# Patient Record
Sex: Female | Born: 1945 | Race: White | Hispanic: No | Smoking: Former smoker
Health system: Southern US, Community
[De-identification: ages and names within clinical notes are randomized; demographics above are authoritative.]

## PROBLEM LIST (undated history)

## (undated) DIAGNOSIS — K5900 Constipation, unspecified: Secondary | ICD-10-CM

## (undated) DIAGNOSIS — E079 Disorder of thyroid, unspecified: Secondary | ICD-10-CM

## (undated) DIAGNOSIS — C9590 Leukemia, unspecified not having achieved remission: Secondary | ICD-10-CM

## (undated) DIAGNOSIS — F329 Major depressive disorder, single episode, unspecified: Secondary | ICD-10-CM

## (undated) DIAGNOSIS — N183 Chronic kidney disease, stage 3 (moderate): Secondary | ICD-10-CM

## (undated) DIAGNOSIS — I10 Essential (primary) hypertension: Secondary | ICD-10-CM

## (undated) DIAGNOSIS — F32A Depression, unspecified: Secondary | ICD-10-CM

## (undated) DIAGNOSIS — Z853 Personal history of malignant neoplasm of breast: Secondary | ICD-10-CM

## (undated) DIAGNOSIS — I739 Peripheral vascular disease, unspecified: Secondary | ICD-10-CM

## (undated) HISTORY — DX: Personal history of malignant neoplasm of breast: Z85.3

## (undated) HISTORY — PX: COLON SURGERY: SHX602

## (undated) HISTORY — DX: Disorder of thyroid, unspecified: E07.9

## (undated) HISTORY — PX: BREAST SURGERY: SHX581

## (undated) HISTORY — DX: Essential (primary) hypertension: I10

## (undated) HISTORY — DX: Chronic kidney disease, stage 3 (moderate): N18.3

## (undated) HISTORY — PX: ABDOMINAL HYSTERECTOMY: SHX81

## (undated) HISTORY — DX: Constipation, unspecified: K59.00

## (undated) HISTORY — DX: Major depressive disorder, single episode, unspecified: F32.9

## (undated) HISTORY — DX: Leukemia, unspecified not having achieved remission: C95.90

## (undated) HISTORY — DX: Depression, unspecified: F32.A

---

## 1998-09-03 HISTORY — PX: HERNIA REPAIR: SHX51

## 2010-12-01 ENCOUNTER — Ambulatory Visit (INDEPENDENT_AMBULATORY_CARE_PROVIDER_SITE_OTHER): Payer: Medicare Other | Admitting: Urology

## 2010-12-01 ENCOUNTER — Other Ambulatory Visit: Payer: Self-pay | Admitting: Urology

## 2010-12-01 ENCOUNTER — Ambulatory Visit (HOSPITAL_COMMUNITY)
Admission: RE | Admit: 2010-12-01 | Discharge: 2010-12-01 | Disposition: A | Discharge status: Home / Self Care | Payer: Medicare Other | Source: Ambulatory Visit | Attending: Urology | Admitting: Urology

## 2010-12-01 DIAGNOSIS — R3129 Other microscopic hematuria: Secondary | ICD-10-CM

## 2011-06-15 ENCOUNTER — Other Ambulatory Visit: Payer: Self-pay | Admitting: Urology

## 2011-06-15 ENCOUNTER — Ambulatory Visit (INDEPENDENT_AMBULATORY_CARE_PROVIDER_SITE_OTHER): Payer: Medicare Other | Admitting: Urology

## 2011-06-15 DIAGNOSIS — N393 Stress incontinence (female) (male): Secondary | ICD-10-CM

## 2011-06-15 DIAGNOSIS — R3129 Other microscopic hematuria: Secondary | ICD-10-CM

## 2011-06-19 ENCOUNTER — Ambulatory Visit (HOSPITAL_COMMUNITY)
Admission: RE | Admit: 2011-06-19 | Discharge: 2011-06-19 | Disposition: A | Discharge status: Home / Self Care | Payer: Medicare Other | Source: Ambulatory Visit | Attending: Urology | Admitting: Urology

## 2011-06-19 DIAGNOSIS — K5730 Diverticulosis of large intestine without perforation or abscess without bleeding: Secondary | ICD-10-CM | POA: Insufficient documentation

## 2011-06-19 DIAGNOSIS — Z853 Personal history of malignant neoplasm of breast: Secondary | ICD-10-CM | POA: Insufficient documentation

## 2011-06-19 DIAGNOSIS — R3129 Other microscopic hematuria: Secondary | ICD-10-CM | POA: Insufficient documentation

## 2011-06-19 MED ORDER — IOHEXOL 300 MG/ML  SOLN
125.0000 mL | Freq: Once | INTRAMUSCULAR | Status: AC | PRN
  Administered 2011-06-19: 125 mL via INTRAVENOUS

## 2011-07-13 ENCOUNTER — Ambulatory Visit (INDEPENDENT_AMBULATORY_CARE_PROVIDER_SITE_OTHER): Payer: Medicare Other | Admitting: Urology

## 2011-07-13 DIAGNOSIS — R3129 Other microscopic hematuria: Secondary | ICD-10-CM

## 2012-02-08 ENCOUNTER — Ambulatory Visit (INDEPENDENT_AMBULATORY_CARE_PROVIDER_SITE_OTHER): Payer: Medicare Other | Admitting: Urology

## 2012-02-08 DIAGNOSIS — R3129 Other microscopic hematuria: Secondary | ICD-10-CM

## 2013-01-07 DIAGNOSIS — E559 Vitamin D deficiency, unspecified: Secondary | ICD-10-CM | POA: Insufficient documentation

## 2013-01-07 DIAGNOSIS — E039 Hypothyroidism, unspecified: Secondary | ICD-10-CM | POA: Insufficient documentation

## 2013-02-23 DIAGNOSIS — G588 Other specified mononeuropathies: Secondary | ICD-10-CM | POA: Insufficient documentation

## 2013-12-28 DIAGNOSIS — G8929 Other chronic pain: Secondary | ICD-10-CM | POA: Insufficient documentation

## 2015-02-10 DIAGNOSIS — G8929 Other chronic pain: Secondary | ICD-10-CM | POA: Insufficient documentation

## 2015-02-10 DIAGNOSIS — K6289 Other specified diseases of anus and rectum: Secondary | ICD-10-CM

## 2015-05-14 DIAGNOSIS — F32A Depression, unspecified: Secondary | ICD-10-CM | POA: Insufficient documentation

## 2015-05-14 DIAGNOSIS — F329 Major depressive disorder, single episode, unspecified: Secondary | ICD-10-CM | POA: Insufficient documentation

## 2015-09-07 ENCOUNTER — Other Ambulatory Visit: Payer: Self-pay | Admitting: Podiatry

## 2015-09-07 DIAGNOSIS — I739 Peripheral vascular disease, unspecified: Secondary | ICD-10-CM

## 2015-09-20 ENCOUNTER — Ambulatory Visit
Admission: RE | Admit: 2015-09-20 | Discharge: 2015-09-20 | Disposition: A | Discharge status: Home / Self Care | Payer: Medicare Other | Source: Ambulatory Visit | Attending: Podiatry | Admitting: Podiatry

## 2015-09-20 DIAGNOSIS — I739 Peripheral vascular disease, unspecified: Secondary | ICD-10-CM

## 2015-09-20 HISTORY — DX: Peripheral vascular disease, unspecified: I73.9

## 2015-09-20 NOTE — Consult Note (Signed)
Chief Complaint: Right lower extremity wound  Referring Physician(s): Dr. Caprice Beaver  History of Present Illness: Renee [name] is a 70 y.o. female  presenting today to Vascular and Interventional radiology clinic, kindly referred by Dr. Caprice Beaver, for evaluation of a right lower extremity wound.  She tells me that she has been seeing Dr. Caprice Beaver for about a year and a half for her foot care, and during a routine appointment in December 2016, he noticed a small ulceration overlying the head of the fifth metatarsal lateral right foot. She had no knowledge that this was present as it had not caused her any discomfort.  Since then, she has been using standard wound care including Silvadene, and she has had near complete healing which is only a few weeks at this point. She tells me this is the first time she has ever had a wound on her lower extremity. She denies any claudication symptoms or resting pain. She does note that her feet "are always cold".    Her cardiovascular risk factors include high cholesterol and a history of smoking, 16 years duration, though she quit many years previously in 1981. She does not have a diagnosis of diabetes. No other evidence of vascular disease, with no history of stroke or heart disease.  She has had noninvasive examination performed at [facility], completed 08/22/2015. The results are via fax: Right resting ABI is 1.14 Right Segmental Doppler demonstrates triphasic waveform of the femoral and popliteal arteries as well as the dorsalis pedis. Biphasic waveform of the posterior tibial artery. Right pulse falling recording demonstrates maintained waveforms with augmentation maintained from high thigh to below knee. The PPG's of the toe demonstrate deterioration with near flat line, either spurious or representing small vessel disease. Left resting ABI is 1.28 Left segmental Doppler demonstrates triphasic waveform of  the femoral and popliteal arteries. There appears to be triphasic waveform of the posterior tibial and dorsalis pedis on the tracing. Segmental PVR demonstrates maintained waveform with augmentation in the calf. The toe PPG's are near flat line, which is either spurious or representing small vessel disease.  Currently she is taking Crestor. She is not taking antiplatelet medication at this time.  Past Medical History  Diagnosis Date  . PVD (peripheral vascular disease) [facility])     Past Surgical History  Procedure Laterality Date  . Hernia repair  2000    Hiatal hernia repair    . Breast surgery      Left Breast Lumpectomy  . Colon surgery    . Abdominal hysterectomy      Allergies: Review of patient's allergies indicates no known allergies.  Medications: Prior to Admission medications   Medication Sig Start Date End Date Taking? Authorizing Provider  clonazePAM (KLONOPIN) 0.5 MG tablet Take 0.5 mg by mouth at bedtime.   Yes Historical Provider, MD  cloNIDine-chlorthalidone (CLORPRES) 0.2-15 MG tablet Take 1 tablet by mouth daily.   Yes Historical Provider, MD  conjugated estrogens (PREMARIN) vaginal cream Place 1 Applicatorful vaginally 2 (two) times a week.   Yes Historical Provider, MD  DULoxetine (CYMBALTA) 60 MG capsule Take 60 mg by mouth daily.   Yes Historical Provider, MD  esomeprazole (NEXIUM) 40 MG capsule Take 40 mg by mouth daily at 12 noon.   Yes Historical Provider, MD  fluticasone (FLOVENT DISKUS) 50 MCG/BLIST diskus inhaler Inhale 2 puffs into the lungs 2 (two) times daily.   Yes Historical Provider, MD  KRILL OIL PO Take 900 mg by  mouth daily.   Yes Historical Provider, MD  levothyroxine (SYNTHROID, LEVOTHROID) 100 MCG tablet Take 100 mcg by mouth daily before breakfast.   Yes Historical Provider, MD  lurasidone (LATUDA) 40 MG TABS tablet Take 40 mg by mouth daily with breakfast.   Yes Historical Provider, MD  methadone (DOLOPHINE) 10 MG tablet Take 10 mg by mouth  every 12 (twelve) hours.   Yes Historical Provider, MD  polyethylene glycol (MIRALAX / GLYCOLAX) packet Take 17 g by mouth daily.   Yes Historical Provider, MD  rosuvastatin (CRESTOR) 20 MG tablet Take 20 mg by mouth daily.   Yes Historical Provider, MD     No family history on file.  Social History   Social History  . Marital Status: Unknown    Spouse Name: N/A  . Number of Children: N/A  . Years of Education: N/A   Social History Main Topics  . Smoking status: Former Smoker -- 0.75 packs/day    Types: Cigarettes    Start date: 09/19/1962    Quit date: 09/20/1979  . Smokeless tobacco: Never Used  . Alcohol Use: No  . Drug Use: No  . Sexual Activity: Not on file   Other Topics Concern  . Not on file   Social History Narrative  . No narrative on file      Review of Systems: A 12 point ROS discussed and pertinent positives are indicated in the HPI above.  All other systems are negative.  Review of Systems  Vital Signs: Ht 6\' 1"  (1.854 m)  Wt 200 lb (90.719 kg)  BMI 26.39 kg/m2  Physical Exam Atraumatic normocephalic. Mucous membranes moist pink. Alert and oriented to person place and time. Appropriate affect. Appropriate questions. Moving all 4 extremities equally. Gross motor and sensory intact. Excursion of the chest is symmetric on inspiration and expiration. No labored breathing. Abdomen is nontender. Genitourinary deferred. Lower extremities are without edema. Right foot demonstrates nonpalpable pulse at the posterior tibial and dorsalis pedis. Doppler signal present at both. The location of the ulcer at the fifth metatarsal head demonstrates near complete healing, with only a small callusing scab. No erythema or rubor.   Left foot demonstrates nonpalpable pulse at the posterior tibial and dorsalis pedis. Doppler signal present at both sites. No left-sided wound.  Mallampati Score:  2  Imaging: See HPI for details of noninvasive exam.    Labs:  CBC: No results for input(s): WBC, HGB, HCT, PLT in the last 8760 hours.  COAGS: No results for input(s): INR, APTT in the last 8760 hours.  BMP: No results for input(s): NA, K, CL, CO2, GLUCOSE, BUN, CALCIUM, CREATININE, GFRNONAA, GFRAA in the last 8760 hours.  Invalid input(s): CMP  LIVER FUNCTION TESTS: No results for input(s): BILITOT, AST, ALT, ALKPHOS, PROT, ALBUMIN in the last 8760 hours.  TUMOR MARKERS: No results for input(s): AFPTM, CEA, CA199, CHROMGRNA in the last 8760 hours.  Assessment and Plan:  Renee [name] is a 70 year old female presenting with a near completely healed ulcer of the right lower extremity overlying the fifth metatarsal head. Noninvasive examination performed in mid December demonstrates no significant arterial occlusive disease. The only interesting finding on this study is diminished waveforms of the toe PPG's, however, she is a person without diabetes as a risk factor for small vessel disease.  I talked to her about the anatomy, pathology/pathophysiology, and natural history of peripheral vascular disease, including the risk factors. I discussed with her treatment, including the foundation of treatment which is medical  therapy. I did share with her my impression that her noninvasive exam shows no significant vascular disease, although she may be someone with small vessel disease below the ankles.  Given that she has healed her wound in appropriate time frame, it is possible that she has no compromise of her vasculature. I talked to her, in addition, about an angiogram and potential treatment, though I think it is reasonable to defer an angiogram until she potentially has another problem. Given our discussion of the risk benefit ratio, and the fact that she has healed her wound, she agrees that deferring an angiogram is reasonable.  At this time I would encourage continued medical therapy, with the addition of a daily 81 mg of aspirin.  I talked  to her about seeing her in 3 months with a repeat noninvasive examination. Her preference would be for Dr. Caprice Beaver to follow her with a noninvasive given the distance she needs to travel to our office.  I have discussed her case with Dr. Caprice Beaver, who is in agreement.  He will plan on seeing her in 3 months with a repeat noninvasive exam. He can fax the exam at that time, and we can discuss any need for treatment.   Thank you for this interesting consult.  I greatly enjoyed meeting Renee [name] and look forward to participating in their care.  A copy of this report was sent to the requesting provider on this date.  Electronically Signed: Corrie Mckusick 09/20/2015, 5:44 PM   I spent a total of  40 Minutes   in face to face in clinical consultation, greater than 50% of which was counseling/coordinating care for right lower extremity wound, PAD, possible angiogram and angioplasty.

## 2016-11-22 ENCOUNTER — Encounter (HOSPITAL_COMMUNITY): Payer: Self-pay | Admitting: Hematology

## 2016-11-22 ENCOUNTER — Encounter (HOSPITAL_COMMUNITY): Payer: Medicare Other

## 2016-11-22 ENCOUNTER — Encounter (HOSPITAL_COMMUNITY): Payer: Medicare Other | Attending: Hematology | Admitting: Hematology

## 2016-11-22 VITALS — BP 113/50 | HR 94 | Temp 98.2°F | Resp 18 | Ht 73.0 in | Wt 204.0 lb

## 2016-11-22 DIAGNOSIS — Z853 Personal history of malignant neoplasm of breast: Secondary | ICD-10-CM

## 2016-11-22 DIAGNOSIS — C9110 Chronic lymphocytic leukemia of B-cell type not having achieved remission: Secondary | ICD-10-CM | POA: Insufficient documentation

## 2016-11-22 LAB — COMPREHENSIVE METABOLIC PANEL
ALBUMIN: 4.1 g/dL (ref 3.5–5.0)
ALK PHOS: 87 U/L (ref 38–126)
ALT: 23 U/L (ref 14–54)
ANION GAP: 9 (ref 5–15)
AST: 32 U/L (ref 15–41)
BILIRUBIN TOTAL: 0.5 mg/dL (ref 0.3–1.2)
BUN: 8 mg/dL (ref 6–20)
CALCIUM: 9.3 mg/dL (ref 8.9–10.3)
CO2: 24 mmol/L (ref 22–32)
Chloride: 104 mmol/L (ref 101–111)
Creatinine, Ser: 1.18 mg/dL — ABNORMAL HIGH (ref 0.44–1.00)
GFR, EST AFRICAN AMERICAN: 53 mL/min — AB (ref >60)
GFR, EST NON AFRICAN AMERICAN: 46 mL/min — AB (ref >60)
GLUCOSE: 120 mg/dL — AB (ref 65–99)
POTASSIUM: 4 mmol/L (ref 3.5–5.1)
Sodium: 137 mmol/L (ref 135–145)
TOTAL PROTEIN: 7.3 g/dL (ref 6.5–8.1)

## 2016-11-22 LAB — LACTATE DEHYDROGENASE: LDH: 165 U/L (ref 98–192)

## 2016-11-22 NOTE — Patient Instructions (Signed)
[place] at [facility] Discharge Instructions  RECOMMENDATIONS MADE BY THE CONSULTANT AND ANY TEST RESULTS WILL BE SENT TO YOUR REFERRING PHYSICIAN.  You were seen today by Dr. Kerry Dory will have lab work done today and we will call you with the results. Follow up in 3 months with lab work See Amy up front for appointments   Thank you for choosing [place] at [facility] to provide your oncology and hematology care.  To afford each patient quality time with our provider, please arrive at least 15 minutes before your scheduled appointment time.    If you have a lab appointment with the [facility] please come in thru the  Main Entrance and check in at the main information desk  You need to re-schedule your appointment should you arrive 10 or more minutes late.  We strive to give you quality time with our providers, and arriving late affects you and other patients whose appointments are after yours.  Also, if you no show three or more times for appointments you may be dismissed from the clinic at the providers discretion.     Again, thank you for choosing [facility].  Our hope is that these requests will decrease the amount of time that you wait before being seen by our physicians.       _____________________________________________________________  Should you have questions after your visit to [facility], please contact our office at [phone] between the hours of 8:30 a.m. and 4:30 p.m.  Voicemails left after 4:30 p.m. will not be returned until the following business day.  For prescription refill requests, have your pharmacy contact our office.       Resources For Cancer Patients and their Caregivers ? American Cancer Society: Can assist with transportation, wigs, general needs, runs Look Good Feel Better.        [phone] ? Cancer Care: Provides financial assistance, online support groups,  medication/co-pay assistance.  1-800-813-HOPE 507-844-4761) ? [place] Assists [place] cancer patients and their families through emotional , educational and financial support.  231-763-5918 ? [place] DSS Where to apply for food stamps, Medicaid and utility assistance. 206 351 2219 ? RCATS: Transportation to medical appointments. 279-028-6056 ? Social Security Administration: May apply for disability if have a Stage IV cancer. 612-007-6643 (570)264-3819 ? [employer], Disability and Transit Services: Assists with nutrition, care and transit needs. [place] Support Programs: @10RELATIVEDAYS @ > Cancer Support Group  2nd Tuesday of the month 1pm-2pm, Journey Room  > Creative Journey  3rd Tuesday of the month 1130am-1pm, Journey Room  > Look Good Feel Better  1st Wednesday of the month 10am-12 noon, Journey Room (Call [place] to register [phone])

## 2016-11-22 NOTE — Progress Notes (Signed)
[name] Kitchen    HEMATOLOGY/ONCOLOGY CONSULTATION NOTE  Date of Service: 11/22/2016  PCP : Dr Bronwen Betters MD [facility] Internal medicine , [place], [place])  CHIEF COMPLAINTS/PURPOSE OF CONSULTATION:  Lymphocytosis  HISTORY OF PRESENTING ILLNESS:   Renee [name] is a wonderful 71 y.o. female who has been referred to [place] by Dr Akinleye/Dr Bronwen Betters MD for evaluation and management of lymphocytosis.  Patient has a history of hypertension, hypothyroidism, dyslipidemia, GERD, neuropathy, left-sided early stage breast cancer status post lumpectomy and radiation in 1999 who was noted to have leukocytosis/lymphocytosis on routine labs done with her primary care physician. Patient had labs done on 09/26/2016 that showed a WBC count of 13.47 k with absolute lymphocyte count of 7.39k.  She was subsequently seen by a hematologist in [place] had repeat labs on 10/22/2016 that showed a WBC count of 14k with an absolute lymphocyte count of 8.3k. Additional workup was done including an LDH level which was within normal limits at 204. Patient had flow cytometry that showed a clonal lymphocyte population which was CD5 positive, CD23 positive with dim lambda light chain restriction representing more than 99% of the B cells and was consistent with chronic lymphocytic leukemia. CD38 was expressed approximately 26% of the clonal B cells which is of indeterminate prognostic significance.  Patient is here to transfer care for continued management of her CLL. Lab showed no associated anemia or thrombocytopenic. The patient has no clinically reported lymph nodes enlarged. No abdominal pain or distention. No fevers no chills no night sweats. No significant weight loss. Overall feels well. She is asymptomatic with respect to her CLL. Patient was not very aware of for this diagnosis meant, the prognosis, treatment criteria and recommendations and we discussed these in great detail. Daughter who is a nurse had number of  questions were also answered in detail.  MEDICAL HISTORY:  Past Medical History:  Diagnosis Date  . PVD (peripheral vascular disease) (HCC)   Hypertension Hypothyroidism Dyslipidemia Chronic pain GERD Neuropathy History of left breast cancer in 1999 status post lumpectomy with adjuvant radiation therapy and tamoxifen for 4 years History of uterine fibroids status post total abdominal hysterectomy.  SURGICAL HISTORY: Past Surgical History:  Procedure Laterality Date  . ABDOMINAL HYSTERECTOMY    . BREAST SURGERY     Left Breast Lumpectomy  . COLON SURGERY    . HERNIA REPAIR  2000   Hiatal hernia repair    Nissen fundoplication Colonic surgery for obstruction in 2010 Right shoulder surgery  SOCIAL HISTORY: Social History   Social History  . Marital status: Unknown    Spouse name: N/A  . Number of children: N/A  . Years of education: N/A   Occupational History  . Not on file.   Social History Main Topics  . Smoking status: Former Smoker    Packs/day: 0.75    Types: Cigarettes    Start date: 09/19/1962    Quit date: 09/20/1979  . Smokeless tobacco: Never Used  . Alcohol use No  . Drug use: No  . Sexual activity: Not on file   Other Topics Concern  . Not on file   Social History Narrative  . No narrative on file  Patient is widowed and currently lives alone. Daughter works as a Marine scientist at [employer]: No family history on file.  ALLERGIES:  is allergic to bupropion; clonidine derivatives; codeine; diclofenac-misoprostol; olanzapine; pregabalin; valdecoxib; and zonisamide.  MEDICATIONS:  Current Outpatient Prescriptions  Medication Sig Dispense Refill  .  clonazePAM (KLONOPIN) 0.5 MG tablet Take 0.5 mg by mouth at bedtime.    . cloNIDine-chlorthalidone (CLORPRES) 0.2-15 MG tablet Take 1 tablet by mouth daily.    [name] Kitchen conjugated estrogens (PREMARIN) vaginal cream Place 1 Applicatorful vaginally 2 (two) times a week.    . DULoxetine  (CYMBALTA) 60 MG capsule Take 60 mg by mouth daily.    [name] Kitchen esomeprazole (NEXIUM) 40 MG capsule Take 40 mg by mouth daily at 12 noon.    . fluticasone (FLOVENT DISKUS) 50 MCG/BLIST diskus inhaler Inhale 2 puffs into the lungs 2 (two) times daily.    [name] Kitchen KRILL OIL PO Take 900 mg by mouth daily.    [name] Kitchen levothyroxine (SYNTHROID, LEVOTHROID) 100 MCG tablet Take 100 mcg by mouth daily before breakfast.    . lurasidone (LATUDA) 40 MG TABS tablet Take 40 mg by mouth daily with breakfast.    . methadone (DOLOPHINE) 10 MG tablet Take 10 mg by mouth every 12 (twelve) hours.    . polyethylene glycol (MIRALAX / GLYCOLAX) packet Take 17 g by mouth daily.    . polyethylene glycol (MIRALAX / GLYCOLAX) packet Take by mouth.    . rosuvastatin (CRESTOR) 20 MG tablet Take 20 mg by mouth daily.     No current facility-administered medications for this visit.     REVIEW OF SYSTEMS:    10 Point review of Systems was done is negative except as noted above.  PHYSICAL EXAMINATION: ECOG PERFORMANCE STATUS: 1 - Symptomatic but completely ambulatory  . Vitals:   11/22/16 0916  BP: (!) 113/50  Pulse: 94  Resp: 18  Temp: 98.2 F (36.8 C)   Filed Weights   11/22/16 0916  Weight: 204 lb (92.5 kg)   .Body mass index is 26.91 kg/m.  GENERAL:alert, in no acute distress and comfortable SKIN: no acute rashes, no significant lesions EYES: conjunctiva are pink and non-injected, sclera anicteric OROPHARYNX: MMM, no exudates, no oropharyngeal erythema or ulceration NECK: supple, no JVD LYMPH:  no palpable lymphadenopathy in the cervical, axillary or inguinal regions LUNGS: clear to auscultation b/l with normal respiratory effort HEART: regular rate & rhythm ABDOMEN:  normoactive bowel sounds , non tender, not distended.No palpable hepatosplenomegaly Extremity: no pedal edema PSYCH: alert & oriented x 3 with fluent speech NEURO: no focal motor/sensory deficits  LABORATORY DATA:  I have reviewed the data as  listed   RADIOGRAPHIC STUDIES: I have personally reviewed the radiological images as listed and agreed with the findings in the report. No results found.  ASSESSMENT & PLAN:   71 year old Caucasian female with  #1 Newly diagnosed CLL. WBC count of 14k with about 8k lymphocytes. Flow cytometry showed 99% clonal population along the B lymphocytes - which were CD5 positive CD23 positive and dim lambda light chain restriction consistent with CLL. Patient has no associated anemia or thrombocytopenia. Does not have constitutional symptoms such as fevers chills night sweats or significant weight loss. No issues with frequent infections. No palpable lymphadenopathy or hepatosplenomegaly. Overall appears to be Rai stage 0 CLL Plan - lab results were discussed in detail with the patient and her daughter. -We discussed in detail the new diagnosis of CLL, natural history, prognosis, treatment criteria, treatment options. -They were given printed educational material reviewed for additional education about CLL. -Patient currently does not meet any criteria warrant starting treatment for her chronic lymphocytic leukemia. -Her flow cytometry suggested that 26% of her clonal B-cell population was CD38 positive which is an indeterminate prognostic marker at those levels. -We  shall get a CLL fish prognostic panel on labs today. -Repeat CBC with differential, reticulocyte count, CMP today. -Patient was educated on important red signs to monitor for and report.  2) history of left-sided breast cancer status post lumpectomy followed by adjuvant radiation therapy in 1999. Patient completed 4 years of adjuvant endocrine therapy with tamoxifen. Noted was discontinued due to vaginal bleeding issues. Patient is status post total abdominal hysterectomy. Patient notes she recently had a mammogram about a week ago in [place] and was told that it was normal. Plan -We we shall try to get a copy of her  mammogram results for our records. -Patient reports no other new breast symptoms.  Return to care in 3 months with repeat labs. Earlier if any new concerns or questions.  All of the patients questions were answered with apparent satisfaction. The patient knows to call the clinic with any problems, questions or concerns.  I spent 45 minutes counseling the patient face to face. The total time spent in the appointment was 60 minutes and more than 50% was on counseling and direct patient cares.    Sullivan Lone MD [place] [facility] Hematology/Oncology Physician [facility]  (Office):       [phone] (Work cell):  [phone] (Fax):           [phone]  11/22/2016 9:08 AM

## 2016-11-23 ENCOUNTER — Other Ambulatory Visit (HOSPITAL_COMMUNITY): Payer: Self-pay

## 2016-11-23 ENCOUNTER — Other Ambulatory Visit (HOSPITAL_COMMUNITY)
Admission: RE | Admit: 2016-11-23 | Discharge: 2016-11-23 | Disposition: A | Discharge status: Home / Self Care | Payer: Medicare Other | Source: Ambulatory Visit | Attending: Oncology | Admitting: Oncology

## 2016-11-23 DIAGNOSIS — C9110 Chronic lymphocytic leukemia of B-cell type not having achieved remission: Secondary | ICD-10-CM | POA: Insufficient documentation

## 2016-11-23 LAB — CBC WITH DIFFERENTIAL/PLATELET
BASOS ABS: 0 10*3/uL (ref 0.0–0.1)
Basophils Relative: 0 %
Eosinophils Absolute: 0.2 10*3/uL (ref 0.0–0.7)
Eosinophils Relative: 1 %
HEMATOCRIT: 40.2 % (ref 36.0–46.0)
HEMOGLOBIN: 12.8 g/dL (ref 12.0–15.0)
LYMPHS PCT: 67 %
Lymphs Abs: 10.9 10*3/uL — ABNORMAL HIGH (ref 0.7–4.0)
MCH: 26.2 pg (ref 26.0–34.0)
MCHC: 31.8 g/dL (ref 30.0–36.0)
MCV: 82.4 fL (ref 78.0–100.0)
MONOS PCT: 5 %
Monocytes Absolute: 0.8 10*3/uL (ref 0.1–1.0)
Neutro Abs: 4.4 10*3/uL (ref 1.7–7.7)
Neutrophils Relative %: 27 %
Platelets: 238 10*3/uL (ref 150–400)
RBC: 4.88 MIL/uL (ref 3.87–5.11)
RDW: 15.4 % (ref 11.5–15.5)
WBC: 16.3 10*3/uL — AB (ref 4.0–10.5)

## 2016-11-23 LAB — RETICULOCYTES
RBC.: 4.88 MIL/uL (ref 3.87–5.11)
RETIC COUNT ABSOLUTE: 58.6 10*3/uL (ref 19.0–186.0)
Retic Ct Pct: 1.2 % (ref 0.4–3.1)

## 2016-12-04 LAB — TISSUE HYBRIDIZATION TO NCBH

## 2016-12-04 LAB — CHROMOSOME ANALYSIS, PERIPHERAL BLOOD

## 2017-02-21 ENCOUNTER — Encounter (HOSPITAL_COMMUNITY): Payer: Self-pay

## 2017-02-21 ENCOUNTER — Encounter (HOSPITAL_COMMUNITY): Payer: Medicare Other | Attending: Oncology | Admitting: Oncology

## 2017-02-21 ENCOUNTER — Encounter (HOSPITAL_COMMUNITY): Payer: Medicare Other

## 2017-02-21 DIAGNOSIS — C9110 Chronic lymphocytic leukemia of B-cell type not having achieved remission: Secondary | ICD-10-CM

## 2017-02-21 DIAGNOSIS — Z853 Personal history of malignant neoplasm of breast: Secondary | ICD-10-CM | POA: Diagnosis not present

## 2017-02-21 DIAGNOSIS — C9190 Lymphoid leukemia, unspecified not having achieved remission: Secondary | ICD-10-CM | POA: Diagnosis not present

## 2017-02-21 LAB — CBC WITH DIFFERENTIAL/PLATELET
BASOS ABS: 0 10*3/uL (ref 0.0–0.1)
Basophils Relative: 0 %
EOS PCT: 1 %
Eosinophils Absolute: 0.2 10*3/uL (ref 0.0–0.7)
HEMATOCRIT: 39.5 % (ref 36.0–46.0)
HEMOGLOBIN: 12.4 g/dL (ref 12.0–15.0)
LYMPHS PCT: 64 %
Lymphs Abs: 10.8 10*3/uL — ABNORMAL HIGH (ref 0.7–4.0)
MCH: 25.9 pg — ABNORMAL LOW (ref 26.0–34.0)
MCHC: 31.4 g/dL (ref 30.0–36.0)
MCV: 82.5 fL (ref 78.0–100.0)
MONOS PCT: 6 %
Monocytes Absolute: 1 10*3/uL (ref 0.1–1.0)
NEUTROS ABS: 4.9 10*3/uL (ref 1.7–7.7)
Neutrophils Relative %: 29 %
Platelets: 248 10*3/uL (ref 150–400)
RBC: 4.79 MIL/uL (ref 3.87–5.11)
RDW: 15.1 % (ref 11.5–15.5)
WBC: 16.9 10*3/uL — ABNORMAL HIGH (ref 4.0–10.5)

## 2017-02-21 LAB — COMPREHENSIVE METABOLIC PANEL
ALBUMIN: 4.1 g/dL (ref 3.5–5.0)
ALK PHOS: 84 U/L (ref 38–126)
ALT: 19 U/L (ref 14–54)
AST: 27 U/L (ref 15–41)
Anion gap: 7 (ref 5–15)
BILIRUBIN TOTAL: 0.4 mg/dL (ref 0.3–1.2)
BUN: 9 mg/dL (ref 6–20)
CALCIUM: 9.2 mg/dL (ref 8.9–10.3)
CO2: 28 mmol/L (ref 22–32)
CREATININE: 1.11 mg/dL — AB (ref 0.44–1.00)
Chloride: 105 mmol/L (ref 101–111)
GFR calc Af Amer: 57 mL/min — ABNORMAL LOW (ref >60)
GFR, EST NON AFRICAN AMERICAN: 49 mL/min — AB (ref >60)
GLUCOSE: 115 mg/dL — AB (ref 65–99)
Potassium: 4.1 mmol/L (ref 3.5–5.1)
Sodium: 140 mmol/L (ref 135–145)
TOTAL PROTEIN: 7 g/dL (ref 6.5–8.1)

## 2017-02-21 NOTE — Patient Instructions (Signed)
[facility] at [facility] Discharge Instructions  RECOMMENDATIONS MADE BY THE CONSULTANT AND ANY TEST RESULTS WILL BE SENT TO YOUR REFERRING PHYSICIAN.  You saw Dr. Zhou today.  Thank you for choosing [facility] at [facility] to provide your oncology and hematology care.  To afford each patient quality time with our provider, please arrive at least 15 minutes before your scheduled appointment time.    If you have a lab appointment with the Cancer Center please come in thru the  Main Entrance and check in at the main information desk  You need to re-schedule your appointment should you arrive 10 or more minutes late.  We strive to give you quality time with our providers, and arriving late affects you and other patients whose appointments are after yours.  Also, if you no show three or more times for appointments you may be dismissed from the clinic at the providers discretion.     Again, thank you for choosing [facility].  Our hope is that these requests will decrease the amount of time that you wait before being seen by our physicians.       _____________________________________________________________  Should you have questions after your visit to [facility], please contact our office at [phone] between the hours of 8:30 a.m. and 4:30 p.m.  Voicemails left after 4:30 p.m. will not be returned until the following business day.  For prescription refill requests, have your pharmacy contact our office.       Resources For Cancer Patients and their Caregivers ? American Cancer Society: Can assist with transportation, wigs, general needs, runs Look Good Feel Better.        1-888-227-6333 ? Cancer Care: Provides financial assistance, online support groups, medication/co-pay assistance.  1-800-813-HOPE (4673) ? [facility] Assists [place] cancer patients and their families through  emotional , educational and financial support.  336-427-4357 ? [place] DSS Where to apply for food stamps, Medicaid and utility assistance. 336-342-1394 ? [employer]ATS: Transportation to medical appointments. 336-347-2287 ? Social Security Administration: May apply for disability if have a Stage IV cancer. 336-342-7796 1-800-772-1213 ? [place] Aging, Disability and Transit Services: Assists with nutrition, care and transit needs. 336-349-2343  Cancer Center Support Programs: @10RELATIVEDAYS@ > Cancer Support Group  2nd Tuesday of the month 1pm-2pm, Journey Room  > Creative Journey  3rd Tuesday of the month 1130am-1pm, Journey Room  > Look Good Feel Better  1st Wednesday of the month 10am-12 noon, Journey Room (Call [employer] to register 1-800-395-5775)    

## 2017-02-21 NOTE — Progress Notes (Signed)
[name] Kitchen    HEMATOLOGY/ONCOLOGY CONSULTATION NOTE  Date of Service: 02/21/2017  PCP : Dr Renee Betters MD [facility] Internal medicine , [place], [place])  CHIEF COMPLAINTS/PURPOSE OF CONSULTATION:  Lymphocytosis  HISTORY OF PRESENTING ILLNESS:   Renee [name] is a wonderful 71 y.o. female who has been referred to [place] by Dr Renee [name]/Dr Renee Betters MD for evaluation and management of lymphocytosis.  Patient has a history of hypertension, hypothyroidism, dyslipidemia, GERD, neuropathy, left-sided early stage breast cancer status post lumpectomy and radiation in 1999 who was noted to have leukocytosis/lymphocytosis on routine labs done with her primary care physician. Patient had labs done on 09/26/2016 that showed a WBC count of 13.47 k with absolute lymphocyte count of 7.39k.  She was subsequently seen by a hematologist in [place] had repeat labs on 10/22/2016 that showed a WBC count of 14k with an absolute lymphocyte count of 8.3k. Additional workup was done including an LDH level which was within normal limits at 204. Patient had flow cytometry that showed a clonal lymphocyte population which was CD5 positive, CD23 positive with dim lambda light chain restriction representing more than 99% of the B cells and was consistent with chronic lymphocytic leukemia. CD38 was expressed approximately 26% of the clonal B cells which is of indeterminate prognostic significance.  Patient presents today for her 3 month follow-up. She states she has been doing well and has no complaints. She denies any drenching night sweats, unexplained weight loss, fevers or chills. No recent infections. She denies noting any lymphadenopathy. Her CBC demonstrated that her WBC is 16.9 K today.  MEDICAL HISTORY:  Past Medical History:  Diagnosis Date  . Constipation   . Depression   . Hypertension   . Leukemia ([place])    CLL  . PVD (peripheral vascular disease) ([place])   . Thyroid disease    Hypertension Hypothyroidism Dyslipidemia Chronic pain GERD Neuropathy History of left breast cancer in 1999 status post lumpectomy with adjuvant radiation therapy and tamoxifen for 4 years History of uterine fibroids status post total abdominal hysterectomy.  SURGICAL HISTORY: Past Surgical History:  Procedure Laterality Date  . ABDOMINAL HYSTERECTOMY    . BREAST SURGERY     Left Breast Lumpectomy  . COLON SURGERY    . HERNIA REPAIR  2000   Hiatal hernia repair    Nissen fundoplication Colonic surgery for obstruction in 2010 Right shoulder surgery  SOCIAL HISTORY: Social History   Social History  . Marital status: Unknown    Spouse name: N/A  . Number of children: N/A  . Years of education: N/A   Occupational History  . Not on file.   Social History Main Topics  . Smoking status: Former Smoker    Packs/day: 0.75    Types: Cigarettes    Start date: 09/19/1962    Quit date: 09/20/1979  . Smokeless tobacco: Never Used  . Alcohol use No  . Drug use: No  . Sexual activity: No   Other Topics Concern  . Not on file   Social History Narrative  . No narrative on file  Patient is widowed and currently lives alone. Daughter works as a Marine scientist at [employer]: History reviewed. No pertinent family history.  ALLERGIES:  is allergic to bupropion; clonidine derivatives; codeine; diclofenac-misoprostol; olanzapine; pregabalin; valdecoxib; and zonisamide.  MEDICATIONS:  Current Outpatient Prescriptions  Medication Sig Dispense Refill  . clonazePAM (KLONOPIN) 0.5 MG tablet Take 0.5 mg by mouth at bedtime.    . cloNIDine-chlorthalidone (CLORPRES) 0.2-15  MG tablet Take 1 tablet by mouth daily.    [name] Kitchen conjugated estrogens (PREMARIN) vaginal cream Place 1 Applicatorful vaginally 2 (two) times a week.    . DULoxetine (CYMBALTA) 60 MG capsule Take 60 mg by mouth daily.    [name] Kitchen esomeprazole (NEXIUM) 40 MG capsule Take 40 mg by mouth daily at 12 noon.    .  fluticasone (FLOVENT DISKUS) 50 MCG/BLIST diskus inhaler Inhale 2 puffs into the lungs 2 (two) times daily.    [name] Kitchen KRILL OIL PO Take 900 mg by mouth daily.    [name] Kitchen levothyroxine (SYNTHROID, LEVOTHROID) 100 MCG tablet Take 100 mcg by mouth daily before breakfast.    . lurasidone (LATUDA) 40 MG TABS tablet Take 40 mg by mouth daily with breakfast.    . methadone (DOLOPHINE) 10 MG tablet Take 10 mg by mouth every 12 (twelve) hours.    . polyethylene glycol (MIRALAX / GLYCOLAX) packet Take 17 g by mouth daily.    . rosuvastatin (CRESTOR) 20 MG tablet Take 20 mg by mouth daily.     No current facility-administered medications for this visit.     REVIEW OF SYSTEMS:    10 Point review of Systems was done is negative except as noted above.  PHYSICAL EXAMINATION: ECOG PERFORMANCE STATUS: 1 - Symptomatic but completely ambulatory  . Vitals:   02/21/17 1345  BP: (!) 104/58  Pulse: 95  Resp: 18  Temp: 98.2 F (36.8 C)   Filed Weights   02/21/17 1345  Weight: 198 lb 12.8 oz (90.2 kg)   .Body mass index is 26.23 kg/m.  Physical Exam  Constitutional: She is oriented to person, place, and time. She appears well-developed and well-nourished. No distress.  HENT:  Head: Normocephalic and atraumatic.  Mouth/Throat: No oropharyngeal exudate.  Eyes: EOM are normal. Pupils are equal, round, and reactive to light. No scleral icterus.  Neck: Normal range of motion. Neck supple.  Cardiovascular: Normal rate, regular rhythm and normal heart sounds.  Exam reveals no gallop and no friction rub.   No murmur heard. Pulmonary/Chest: Effort normal and breath sounds normal. No respiratory distress. She has no wheezes. She has no rales. She exhibits no tenderness.  Abdominal: Soft. Bowel sounds are normal. She exhibits no distension. There is no tenderness.  Musculoskeletal: Normal range of motion. She exhibits no edema, tenderness or deformity.  Lymphadenopathy:    She has no cervical adenopathy.   Neurological: She is alert and oriented to person, place, and time. No cranial nerve deficit. Coordination normal.  Skin: Skin is warm and dry. No rash noted. No erythema. No pallor.  Psychiatric: She has a normal mood and affect. Her behavior is normal. Judgment and thought content normal.    LABORATORY DATA:  I have reviewed the data as listed   RADIOGRAPHIC STUDIES: I have personally reviewed the radiological images as listed and agreed with the findings in the report. No results found.  ASSESSMENT & PLAN:   71 year old Caucasian female with  1. RAI Stage 0 CLL Flow cytometry showed 99% clonal population along the B lymphocytes - which were CD5 positive CD23 positive and dim lambda light chain restriction consistent with CLL. Reviewed labs with patient today. Mild increase in leukocytosis. Patient has no associated anemia or thrombocytopenia. No indication to start treatment at this time. Continue observation of her labs.  2.  history of left-sided breast cancer status post lumpectomy followed by adjuvant radiation therapy in 1999. Patient completed 4 years of adjuvant endocrine therapy with tamoxifen. Noted  was discontinued due to vaginal bleeding issues. Patient is status post total abdominal hysterectomy. - She is cured without any evidence of recurrence. Continue yearly mammogram surveillance. Had a recent negative mammogram this year in [place], [place].  RTC in 3 months for follow up with labs.  Orders Placed This Encounter  Procedures  . CBC with Differential    Standing Status:   Future    Standing Expiration Date:   02/21/2018  . Comprehensive metabolic panel    Standing Status:   Future    Standing Expiration Date:   02/21/2018    Twana First, MD

## 2017-05-24 ENCOUNTER — Encounter (HOSPITAL_BASED_OUTPATIENT_CLINIC_OR_DEPARTMENT_OTHER): Payer: Medicare Other | Admitting: Oncology

## 2017-05-24 ENCOUNTER — Encounter (HOSPITAL_COMMUNITY): Payer: Self-pay

## 2017-05-24 ENCOUNTER — Encounter (HOSPITAL_COMMUNITY): Payer: Medicare Other | Attending: Oncology

## 2017-05-24 VITALS — BP 118/74 | HR 92 | Resp 16 | Ht 72.0 in | Wt 196.0 lb

## 2017-05-24 DIAGNOSIS — C9190 Lymphoid leukemia, unspecified not having achieved remission: Secondary | ICD-10-CM | POA: Insufficient documentation

## 2017-05-24 DIAGNOSIS — Z853 Personal history of malignant neoplasm of breast: Secondary | ICD-10-CM

## 2017-05-24 DIAGNOSIS — C9110 Chronic lymphocytic leukemia of B-cell type not having achieved remission: Secondary | ICD-10-CM

## 2017-05-24 LAB — COMPREHENSIVE METABOLIC PANEL
ALBUMIN: 4.1 g/dL (ref 3.5–5.0)
ALT: 19 U/L (ref 14–54)
AST: 28 U/L (ref 15–41)
Alkaline Phosphatase: 78 U/L (ref 38–126)
Anion gap: 8 (ref 5–15)
BUN: 12 mg/dL (ref 6–20)
CHLORIDE: 105 mmol/L (ref 101–111)
CO2: 26 mmol/L (ref 22–32)
CREATININE: 1.08 mg/dL — AB (ref 0.44–1.00)
Calcium: 9.1 mg/dL (ref 8.9–10.3)
GFR calc Af Amer: 59 mL/min — ABNORMAL LOW (ref >60)
GFR calc non Af Amer: 51 mL/min — ABNORMAL LOW (ref >60)
GLUCOSE: 120 mg/dL — AB (ref 65–99)
POTASSIUM: 4.3 mmol/L (ref 3.5–5.1)
Sodium: 139 mmol/L (ref 135–145)
Total Bilirubin: 0.5 mg/dL (ref 0.3–1.2)
Total Protein: 6.9 g/dL (ref 6.5–8.1)

## 2017-05-24 LAB — CBC WITH DIFFERENTIAL/PLATELET
BASOS ABS: 0 10*3/uL (ref 0.0–0.1)
Basophils Relative: 0 %
Eosinophils Absolute: 0.2 10*3/uL (ref 0.0–0.7)
Eosinophils Relative: 1 %
HCT: 39.4 % (ref 36.0–46.0)
Hemoglobin: 12.7 g/dL (ref 12.0–15.0)
Lymphocytes Relative: 70 %
Lymphs Abs: 12.7 10*3/uL — ABNORMAL HIGH (ref 0.7–4.0)
MCH: 26.3 pg (ref 26.0–34.0)
MCHC: 32.2 g/dL (ref 30.0–36.0)
MCV: 81.6 fL (ref 78.0–100.0)
MONO ABS: 0.7 10*3/uL (ref 0.1–1.0)
MONOS PCT: 4 %
NEUTROS PCT: 25 %
Neutro Abs: 4.6 10*3/uL (ref 1.7–7.7)
PLATELETS: 212 10*3/uL (ref 150–400)
RBC: 4.83 MIL/uL (ref 3.87–5.11)
RDW: 15.1 % (ref 11.5–15.5)
WBC: 18.2 10*3/uL — AB (ref 4.0–10.5)

## 2017-05-24 NOTE — Progress Notes (Signed)
[name] Kitchen    HEMATOLOGY/ONCOLOGY CONSULTATION NOTE  Date of Service: 05/24/2017  PCP : Dr Bronwen Betters MD [facility] Internal medicine , [place], [place])  CHIEF COMPLAINTS/PURPOSE OF CONSULTATION:  Lymphocytosis  HISTORY OF PRESENTING ILLNESS:   Renee [name] is a wonderful 71 y.o. female who has been referred to [place] by Dr Akinleye/Dr Bronwen Betters MD for evaluation and management of lymphocytosis.  Patient has a history of hypertension, hypothyroidism, dyslipidemia, GERD, neuropathy, left-sided early stage breast cancer status post lumpectomy and radiation in 1999 who was noted to have leukocytosis/lymphocytosis on routine labs done with her primary care physician. Patient had labs done on 09/26/2016 that showed a WBC count of 13.47 k with absolute lymphocyte count of 7.39k.  She was subsequently seen by a hematologist in [place] had repeat labs on 10/22/2016 that showed a WBC count of 14k with an absolute lymphocyte count of 8.3k. Additional workup was done including an LDH level which was within normal limits at 204. Patient had flow cytometry that showed a clonal lymphocyte population which was CD5 positive, CD23 positive with dim lambda light chain restriction representing more than 99% of the B cells and was consistent with chronic lymphocytic leukemia. CD38 was expressed approximately 26% of the clonal B cells which is of indeterminate prognostic significance.  Patient presents today for continued follow up of CLL. She states that she's been doing well except for her chronic issues with chronic pain syndrome due to rectal pain. She also has constant sweats from postmenopausal symptoms. She denies any worsening fatigue, explained weight loss, fevers or chills, drenching night sweats. She denies noting any lymphadenopathy, chest pain, shortness breath, abdominal pain, focal weakness.  MEDICAL HISTORY:  Past Medical History:  Diagnosis Date  . Constipation   . Depression   . Hypertension    . Leukemia ([place])    CLL  . PVD (peripheral vascular disease) ([place])   . Thyroid disease   Hypertension Hypothyroidism Dyslipidemia Chronic pain GERD Neuropathy History of left breast cancer in 1999 status post lumpectomy with adjuvant radiation therapy and tamoxifen for 4 years History of uterine fibroids status post total abdominal hysterectomy.  SURGICAL HISTORY: Past Surgical History:  Procedure Laterality Date  . ABDOMINAL HYSTERECTOMY    . BREAST SURGERY     Left Breast Lumpectomy  . COLON SURGERY    . HERNIA REPAIR  2000   Hiatal hernia repair    Nissen fundoplication Colonic surgery for obstruction in 2010 Right shoulder surgery  SOCIAL HISTORY: Social History   Social History  . Marital status: Unknown    Spouse name: N/A  . Number of children: N/A  . Years of education: N/A   Occupational History  . Not on file.   Social History Main Topics  . Smoking status: Former Smoker    Packs/day: 0.75    Types: Cigarettes    Start date: 09/19/1962    Quit date: 09/20/1979  . Smokeless tobacco: Never Used  . Alcohol use No  . Drug use: No  . Sexual activity: No   Other Topics Concern  . Not on file   Social History Narrative  . No narrative on file  Patient is widowed and currently lives alone. Daughter works as a Marine scientist at [employer]: History reviewed. No pertinent family history.  ALLERGIES:  is allergic to bupropion; clonidine derivatives; codeine; diclofenac-misoprostol; olanzapine; pregabalin; valdecoxib; and zonisamide.  MEDICATIONS:  Current Outpatient Prescriptions  Medication Sig Dispense Refill  . clonazePAM (KLONOPIN) 0.5 MG  tablet Take 0.5 mg by mouth at bedtime.    . cloNIDine-chlorthalidone (CLORPRES) 0.2-15 MG tablet Take 1 tablet by mouth daily.    [name] Kitchen conjugated estrogens (PREMARIN) vaginal cream Place 1 Applicatorful vaginally 2 (two) times a week.    . DULoxetine (CYMBALTA) 60 MG capsule Take 60 mg by mouth  daily.    [name] Kitchen esomeprazole (NEXIUM) 40 MG capsule Take 40 mg by mouth daily at 12 noon.    . fluticasone (FLOVENT DISKUS) 50 MCG/BLIST diskus inhaler Inhale 2 puffs into the lungs 2 (two) times daily.    [name] Kitchen KRILL OIL PO Take 900 mg by mouth daily.    [name] Kitchen levothyroxine (SYNTHROID, LEVOTHROID) 100 MCG tablet Take 100 mcg by mouth daily before breakfast.    . lurasidone (LATUDA) 40 MG TABS tablet Take 40 mg by mouth daily with breakfast.    . methadone (DOLOPHINE) 10 MG tablet Take 10 mg by mouth every 12 (twelve) hours.    . polyethylene glycol (MIRALAX / GLYCOLAX) packet Take 17 g by mouth daily.    . rosuvastatin (CRESTOR) 20 MG tablet Take 20 mg by mouth daily.     No current facility-administered medications for this visit.     REVIEW OF SYSTEMS:    10 Point review of Systems was done is negative except as noted above.  PHYSICAL EXAMINATION: ECOG PERFORMANCE STATUS: 1 - Symptomatic but completely ambulatory  . Vitals:   05/24/17 1055  BP: 118/74  Pulse: 92  Resp: 16  SpO2: 98%   Filed Weights   05/24/17 1055  Weight: 196 lb (88.9 kg)   .Body mass index is 26.58 kg/m.  Physical Exam  Constitutional: She is oriented to person, place, and time. She appears well-developed and well-nourished. No distress.  HENT:  Head: Normocephalic and atraumatic.  Mouth/Throat: No oropharyngeal exudate.  Eyes: Pupils are equal, round, and reactive to light. EOM are normal. No scleral icterus.  Neck: Normal range of motion. Neck supple.  Cardiovascular: Normal rate, regular rhythm and normal heart sounds.  Exam reveals no gallop and no friction rub.   No murmur heard. Pulmonary/Chest: Effort normal and breath sounds normal. No respiratory distress. She has no wheezes. She has no rales. She exhibits no tenderness.  Abdominal: Soft. Bowel sounds are normal. She exhibits no distension. There is no tenderness.  Musculoskeletal: Normal range of motion. She exhibits no edema, tenderness or deformity.   Lymphadenopathy:    She has no cervical adenopathy.  Neurological: She is alert and oriented to person, place, and time. No cranial nerve deficit. Coordination normal.  Skin: Skin is warm and dry. No rash noted. No erythema. No pallor.  Psychiatric: She has a normal mood and affect. Her behavior is normal. Judgment and thought content normal.    LABORATORY DATA:  I have reviewed the data as listed   RADIOGRAPHIC STUDIES: I have personally reviewed the radiological images as listed and agreed with the findings in the report. No results found.  ASSESSMENT & PLAN:   71 year old Caucasian female with  1. RAI Stage 0 CLL Flow cytometry showed 99% clonal population along the B lymphocytes - which were CD5 positive CD23 positive and dim lambda light chain restriction consistent with CLL. Reviewed labs with patient today. Mild increase in leukocytosis up to 18.2k. Patient has no associated anemia or thrombocytopenia. No indication to start treatment at this time such as worsening B symptoms, anemia with hemoglobin <10, platelets <100k. Continue observation of her labs.  2.  history of left-sided  breast cancer status post lumpectomy followed by adjuvant radiation therapy in 1999. Patient completed 4 years of adjuvant endocrine therapy with tamoxifen. Noted was discontinued due to vaginal bleeding issues. Patient is status post total abdominal hysterectomy. - She is cured without any evidence of recurrence. Continue yearly mammogram surveillance. Had a recent negative mammogram this year in [place], [place].  RTC in 4 months for follow up with labs.  Orders Placed This Encounter  Procedures  . CBC with Differential    Standing Status:   Future    Standing Expiration Date:   05/24/2018  . Comprehensive metabolic panel    Standing Status:   Future    Standing Expiration Date:   05/24/2018    Twana First, MD

## 2017-09-27 ENCOUNTER — Inpatient Hospital Stay (HOSPITAL_BASED_OUTPATIENT_CLINIC_OR_DEPARTMENT_OTHER): Payer: Medicare Other | Admitting: Internal Medicine

## 2017-09-27 ENCOUNTER — Encounter (HOSPITAL_COMMUNITY): Payer: Self-pay | Admitting: Internal Medicine

## 2017-09-27 ENCOUNTER — Inpatient Hospital Stay (HOSPITAL_COMMUNITY): Payer: Medicare Other | Attending: Oncology

## 2017-09-27 ENCOUNTER — Other Ambulatory Visit: Payer: Self-pay

## 2017-09-27 VITALS — BP 118/72 | HR 92 | Temp 98.4°F | Resp 18 | Ht 72.0 in | Wt 196.0 lb

## 2017-09-27 DIAGNOSIS — Z853 Personal history of malignant neoplasm of breast: Secondary | ICD-10-CM

## 2017-09-27 DIAGNOSIS — Z923 Personal history of irradiation: Secondary | ICD-10-CM | POA: Insufficient documentation

## 2017-09-27 DIAGNOSIS — C9110 Chronic lymphocytic leukemia of B-cell type not having achieved remission: Secondary | ICD-10-CM | POA: Diagnosis present

## 2017-09-27 DIAGNOSIS — G894 Chronic pain syndrome: Secondary | ICD-10-CM

## 2017-09-27 DIAGNOSIS — Z90710 Acquired absence of both cervix and uterus: Secondary | ICD-10-CM | POA: Insufficient documentation

## 2017-09-27 LAB — COMPREHENSIVE METABOLIC PANEL
ALBUMIN: 4.1 g/dL (ref 3.5–5.0)
ALK PHOS: 88 U/L (ref 38–126)
ALT: 18 U/L (ref 14–54)
AST: 28 U/L (ref 15–41)
Anion gap: 9 (ref 5–15)
BILIRUBIN TOTAL: 0.5 mg/dL (ref 0.3–1.2)
BUN: 10 mg/dL (ref 6–20)
CALCIUM: 9.1 mg/dL (ref 8.9–10.3)
CO2: 27 mmol/L (ref 22–32)
Chloride: 102 mmol/L (ref 101–111)
Creatinine, Ser: 1.07 mg/dL — ABNORMAL HIGH (ref 0.44–1.00)
GFR calc Af Amer: 60 mL/min — ABNORMAL LOW (ref >60)
GFR calc non Af Amer: 51 mL/min — ABNORMAL LOW (ref >60)
GLUCOSE: 102 mg/dL — AB (ref 65–99)
Potassium: 4.4 mmol/L (ref 3.5–5.1)
Sodium: 138 mmol/L (ref 135–145)
TOTAL PROTEIN: 7.1 g/dL (ref 6.5–8.1)

## 2017-09-27 LAB — CBC WITH DIFFERENTIAL/PLATELET
BASOS ABS: 0 10*3/uL (ref 0.0–0.1)
Basophils Relative: 0 %
Eosinophils Absolute: 0 10*3/uL (ref 0.0–0.7)
Eosinophils Relative: 0 %
HEMATOCRIT: 40.3 % (ref 36.0–46.0)
Hemoglobin: 12.1 g/dL (ref 12.0–15.0)
LYMPHS PCT: 75 %
Lymphs Abs: 21 10*3/uL — ABNORMAL HIGH (ref 0.7–4.0)
MCH: 25.5 pg — ABNORMAL LOW (ref 26.0–34.0)
MCHC: 30 g/dL (ref 30.0–36.0)
MCV: 84.8 fL (ref 78.0–100.0)
MONOS PCT: 4 %
Monocytes Absolute: 1.1 10*3/uL — ABNORMAL HIGH (ref 0.1–1.0)
NEUTROS ABS: 5.9 10*3/uL (ref 1.7–7.7)
NEUTROS PCT: 21 %
Platelets: 253 10*3/uL (ref 150–400)
RBC: 4.75 MIL/uL (ref 3.87–5.11)
RDW: 14.9 % (ref 11.5–15.5)
WBC: 28 10*3/uL — ABNORMAL HIGH (ref 4.0–10.5)

## 2017-09-27 NOTE — Progress Notes (Signed)
[facility] hematology oncology follow-up note   PCP : Dr Bronwen Betters MD [facility] Internal medicine , [place], [place])  CHIEF COMPLAINT   CLL diagnosed in February 2018 currently on observation. Personal History of left breast cancer in 1999, underwent lumpectomy followed by adjuvant radiotherapy and took tamoxifen for 4 years.:  HISTORY OF PRESENTING ILLNESS:  Patient returns for follow-up for stage 0 CLL.  She also has a history of breast cancer she has been doing well except for her chronic symptoms of chronic pain and long-standing symptom of day and nighttime sweating. She denies any weight loss no change in appetite. She denies having felt any new lumps in the breast neck or axillae.  No abdominal pain no change in bowel habits.   MEDICAL HISTORY:  Past Medical History:  Diagnosis Date  . Constipation   . Depression   . Hypertension   . Leukemia ([place])    CLL  . PVD (peripheral vascular disease) ([place])   . Thyroid disease   Hypertension Hypothyroidism Dyslipidemia Chronic pain GERD Neuropathy History of left breast cancer in 1999 status post lumpectomy with adjuvant radiation therapy and tamoxifen for 4 years History of uterine fibroids status post total abdominal hysterectomy.  SURGICAL HISTORY: Past Surgical History:  Procedure Laterality Date  . ABDOMINAL HYSTERECTOMY    . BREAST SURGERY     Left Breast Lumpectomy  . COLON SURGERY    . HERNIA REPAIR  2000   Hiatal hernia repair    Nissen fundoplication Colonic surgery for obstruction in 2010 Right shoulder surgery  SOCIAL HISTORY: Patient is widowed and currently lives alone. Daughter works as a Marine scientist at [employer]: No family history on file.  ALLERGIES:  is allergic to bupropion; clonidine derivatives; codeine; diclofenac-misoprostol; olanzapine; pregabalin; valdecoxib; and zonisamide.  MEDICATIONS:  Current Outpatient Medications  Medication Sig  Dispense Refill  . clonazePAM (KLONOPIN) 0.5 MG tablet Take 0.5 mg by mouth at bedtime.    . cloNIDine-chlorthalidone (CLORPRES) 0.2-15 MG tablet Take 1 tablet by mouth daily.    [name] Kitchen conjugated estrogens (PREMARIN) vaginal cream Place 1 Applicatorful vaginally 2 (two) times a week.    . DULoxetine (CYMBALTA) 60 MG capsule Take 60 mg by mouth daily.    [name] Kitchen esomeprazole (NEXIUM) 40 MG capsule Take 40 mg by mouth daily at 12 noon.    . fluticasone (FLOVENT DISKUS) 50 MCG/BLIST diskus inhaler Inhale 2 puffs into the lungs 2 (two) times daily.    [name] Kitchen KRILL OIL PO Take 900 mg by mouth daily.    [name] Kitchen levothyroxine (SYNTHROID, LEVOTHROID) 100 MCG tablet Take 100 mcg by mouth daily before breakfast.    . lurasidone (LATUDA) 40 MG TABS tablet Take 40 mg by mouth daily with breakfast.    . methadone (DOLOPHINE) 10 MG tablet Take 10 mg by mouth every 12 (twelve) hours.    . polyethylene glycol (MIRALAX / GLYCOLAX) packet Take 17 g by mouth daily.    . rosuvastatin (CRESTOR) 20 MG tablet Take 20 mg by mouth daily.     No current facility-administered medications for this visit.    CMP is unremarkable except for some mild serum creatinine elevation that is stable.  Today's CBC shows an increase in lymphocyte count in 6 months it has nearly doubled  PHYSICAL EXAMINATION: Exam shows her to be well-built well-nourished, vitals are stable, no palpable lymph nodes were felt in the neck supraclavicular or axillary areas.  Abdomen is soft nontender  no hepatosplenomegaly.  All 4 extremities are without edema.  She is alert awake oriented x3.  HEENT is normal. Respirations are unlabored.  Heart rhythm is regular.  ASSESSMENT & PLAN:   72 year old Caucasian female with  1. RAI Stage 0 CLL Patient had flow cytometry that showed a clonal lymphocyte population which was CD5 positive, CD23 positive with dim lambda light chain restriction representing more than 99% of the B cells and was consistent with chronic lymphocytic  leukemia. CD38 was expressed approximately 26% of the clonal B cells which is of indeterminate prognostic significance.  CBC today shows increase in white blood cell count to 28 hemoglobin is stable at 12.1 platelets are 253.  Absolute lymphocyte count increased to 21 when compared to June 2018 it appears that her lymphocyte count has nearly doubled.  I am not able to see any cytogenetics in the electronic medical record to determine prognosis.  Given the increase in the count I would like her to return back in 3 months rather than 6 months.  She was counseled to contact us for any symptoms that appeared to be worsening including nonspecific symptoms such as unexplained weight loss low-grade fevers palpable lymph nodes abdominal pain frequent infections increase in her night sweats compared to her baseline.   2.  history of left-sided breast cancer status post lumpectomy followed by adjuvant radiation therapy in 1999. Patient completed 4 years of adjuvant endocrine therapy with tamoxifen. Noted was discontinued due to vaginal bleeding issues. Patient is status post total abdominal hysterectomy. - She is cured without any evidence of recurrence. Continue yearly mammogram surveillance. Had a recent negative mammogram this year in [place], [place].  External mammography report from 11/15/2016 at [facility] has been reviewed which was a bilateral screening mammogram with 3D imaging and shows benign BI-RADS Category 2 findings annual follow-up is recommended which will be due in March 2019.  Patient preferred to have that in [place] ordered through her OB/GYN but if the O since her OB/GYN is retiring she would like to discuss this with her primary care physician also in [place] at her upcoming visit with him next week.  3.  Chronic pain syndrome stable follows with pain clinic.

## 2017-12-26 ENCOUNTER — Other Ambulatory Visit (HOSPITAL_COMMUNITY): Payer: Self-pay

## 2017-12-26 DIAGNOSIS — C9110 Chronic lymphocytic leukemia of B-cell type not having achieved remission: Secondary | ICD-10-CM

## 2017-12-27 ENCOUNTER — Inpatient Hospital Stay (HOSPITAL_COMMUNITY): Payer: Medicare Other | Admitting: Oncology

## 2017-12-27 ENCOUNTER — Encounter (HOSPITAL_COMMUNITY): Payer: Self-pay | Admitting: Oncology

## 2017-12-27 ENCOUNTER — Other Ambulatory Visit: Payer: Self-pay

## 2017-12-27 ENCOUNTER — Inpatient Hospital Stay (HOSPITAL_COMMUNITY): Payer: Medicare Other | Attending: Hematology

## 2017-12-27 VITALS — BP 97/60 | HR 85 | Temp 98.2°F | Resp 16 | Wt 197.8 lb

## 2017-12-27 DIAGNOSIS — F329 Major depressive disorder, single episode, unspecified: Secondary | ICD-10-CM

## 2017-12-27 DIAGNOSIS — N183 Chronic kidney disease, stage 3 unspecified: Secondary | ICD-10-CM

## 2017-12-27 DIAGNOSIS — G894 Chronic pain syndrome: Secondary | ICD-10-CM

## 2017-12-27 DIAGNOSIS — C9110 Chronic lymphocytic leukemia of B-cell type not having achieved remission: Secondary | ICD-10-CM | POA: Insufficient documentation

## 2017-12-27 DIAGNOSIS — Z853 Personal history of malignant neoplasm of breast: Secondary | ICD-10-CM

## 2017-12-27 DIAGNOSIS — D649 Anemia, unspecified: Secondary | ICD-10-CM | POA: Diagnosis not present

## 2017-12-27 DIAGNOSIS — K6289 Other specified diseases of anus and rectum: Secondary | ICD-10-CM

## 2017-12-27 DIAGNOSIS — F32A Depression, unspecified: Secondary | ICD-10-CM

## 2017-12-27 DIAGNOSIS — G8929 Other chronic pain: Secondary | ICD-10-CM

## 2017-12-27 HISTORY — DX: Personal history of malignant neoplasm of breast: Z85.3

## 2017-12-27 HISTORY — DX: Chronic kidney disease, stage 3 unspecified: N18.30

## 2017-12-27 LAB — CBC WITH DIFFERENTIAL/PLATELET
Basophils Absolute: 0 K/uL (ref 0.0–0.1)
Basophils Relative: 0 %
Eosinophils Absolute: 0.3 K/uL (ref 0.0–0.7)
Eosinophils Relative: 1 %
HCT: 38.6 % (ref 36.0–46.0)
Hemoglobin: 11.6 g/dL — ABNORMAL LOW (ref 12.0–15.0)
Lymphocytes Relative: 81 %
Lymphs Abs: 27 K/uL — ABNORMAL HIGH (ref 0.7–4.0)
MCH: 24.9 pg — ABNORMAL LOW (ref 26.0–34.0)
MCHC: 30.1 g/dL (ref 30.0–36.0)
MCV: 82.8 fL (ref 78.0–100.0)
Monocytes Absolute: 1.3 K/uL — ABNORMAL HIGH (ref 0.1–1.0)
Monocytes Relative: 4 %
Neutro Abs: 4.6 K/uL (ref 1.7–7.7)
Neutrophils Relative %: 14 %
Platelets: 243 K/uL (ref 150–400)
RBC: 4.66 MIL/uL (ref 3.87–5.11)
RDW: 15.3 % (ref 11.5–15.5)
WBC: 33.2 K/uL — ABNORMAL HIGH (ref 4.0–10.5)

## 2017-12-27 LAB — COMPREHENSIVE METABOLIC PANEL
ALBUMIN: 3.8 g/dL (ref 3.5–5.0)
ALT: 16 U/L (ref 14–54)
ANION GAP: 11 (ref 5–15)
AST: 25 U/L (ref 15–41)
Alkaline Phosphatase: 83 U/L (ref 38–126)
BILIRUBIN TOTAL: 0.4 mg/dL (ref 0.3–1.2)
BUN: 12 mg/dL (ref 6–20)
CO2: 25 mmol/L (ref 22–32)
Calcium: 9.2 mg/dL (ref 8.9–10.3)
Chloride: 102 mmol/L (ref 101–111)
Creatinine, Ser: 1.14 mg/dL — ABNORMAL HIGH (ref 0.44–1.00)
GFR calc non Af Amer: 48 mL/min — ABNORMAL LOW (ref >60)
GFR, EST AFRICAN AMERICAN: 55 mL/min — AB (ref >60)
GLUCOSE: 118 mg/dL — AB (ref 65–99)
POTASSIUM: 4.3 mmol/L (ref 3.5–5.1)
Sodium: 138 mmol/L (ref 135–145)
TOTAL PROTEIN: 6.6 g/dL (ref 6.5–8.1)

## 2017-12-27 NOTE — Progress Notes (Signed)
t       Patient, No Pcp Per No address on file  CLL (chronic lymphocytic leukemia) (HCC)  History of breast cancer  Chronic pain syndrome  Chronic depression  Rectal pain, chronic  Chronic renal disease, stage 3, moderately decreased glomerular filtration rate between 30-59 mL/min/1.73 square meter (HCC)   HISTORY OF PRESENT ILLNESS: RAI Stage 0 CLL, FLOW cytometry showing a clonal lymphocyte population CD5 and CD23+ with dim lambda light chain restriction representing more than 99% of the B-cell population consistent with CLL.  CD38 was expressed approximately 26% of the clonal B cells which is of indeterminate prognostic significance.  CURRENT THERAPY:  Observation.  CURRENT STATUS: Renee [name] 72 y.o. female returns for followup of CLL, Stage 0.  She continues to have her chronic complaints of fatigue and easy bruising.  She notes that her appetite has 50% in her and her level is 50%.  She does have chronic rectal pain which is being managed with long-acting pain medication, methadone.  She rates her pain is a 3 out of 10.  She denies any the symptoms including night sweats, fevers, and chills.  She has not required any recent antibiotics.  She denies any recent hospitalizations.  She denies any new lumps or bumps on her own examination.  She denies any new pain.  She denies any abdominal discomfort or early satiety.  Review of Systems  Constitutional: Positive for malaise/fatigue. Negative for chills, fever and weight loss.  HENT: Negative.   Eyes: Negative.   Respiratory: Negative.  Negative for cough.   Cardiovascular: Negative.  Negative for chest pain.  Gastrointestinal: Negative.  Negative for blood in stool, constipation, diarrhea, melena, nausea and vomiting.  Genitourinary: Negative.   Musculoskeletal: Negative.   Skin: Negative.   Neurological: Negative.  Negative for weakness.  Endo/Heme/Allergies: Bruises/bleeds easily.  Psychiatric/Behavioral: Negative.      Past Medical History:  Diagnosis Date  . Chronic renal disease, stage 3, moderately decreased glomerular filtration rate between 30-59 mL/min/1.73 square meter ([place]) 12/27/2017  . Constipation   . Depression   . History of breast cancer 12/27/2017  . Hypertension   . Leukemia ([place])    CLL  . PVD (peripheral vascular disease) ([place])   . Thyroid disease     Past Surgical History:  Procedure Laterality Date  . ABDOMINAL HYSTERECTOMY    . BREAST SURGERY     Left Breast Lumpectomy  . COLON SURGERY    . HERNIA REPAIR  2000   Hiatal hernia repair      History reviewed. No pertinent family history.  Social History   Socioeconomic History  . Marital status: Unknown    Spouse name: Not on file  . Number of children: Not on file  . Years of education: Not on file  . Highest education level: Not on file  Occupational History  . Not on file  Social Needs  . Financial resource strain: Not on file  . Food insecurity:    Worry: Not on file    Inability: Not on file  . Transportation needs:    Medical: Not on file    Non-medical: Not on file  Tobacco Use  . Smoking status: Former Smoker    Packs/day: 0.75    Types: Cigarettes    Start date: 09/19/1962    Last attempt to quit: 09/20/1979    Years since quitting: 38.2  . Smokeless tobacco: Never Used  Substance and Sexual Activity  . Alcohol use: No  Alcohol/week: 0.0 oz  . Drug use: No  . Sexual activity: Never  Lifestyle  . Physical activity:    Days per week: Not on file    Minutes per session: Not on file  . Stress: Not on file  Relationships  . Social connections:    Talks on phone: Not on file    Gets together: Not on file    Attends religious service: Not on file    Active member of club or organization: Not on file    Attends meetings of clubs or organizations: Not on file    Relationship status: Not on file  Other Topics Concern  . Not on file  Social History Narrative  . Not on file     PHYSICAL  EXAMINATION  ECOG PERFORMANCE STATUS: 1 - Symptomatic but completely ambulatory  Vitals:   12/27/17 1113  BP: 97/60  Pulse: 85  Resp: 16  Temp: 98.2 F (36.8 C)  SpO2: 98%    GENERAL:alert, healthy, no distress, well nourished, well developed, comfortable, cooperative and smiling SKIN: skin color, texture, turgor are normal, no rashes or significant lesions HEAD: Normocephalic, No masses, lesions, tenderness or abnormalities EYES: normal, EOMI, Conjunctiva are pink and non-injected EARS: External ears normal OROPHARYNX:lips, buccal mucosa, and tongue normal and mucous membranes are moist  NECK: supple, no adenopathy, trachea midline LYMPH:  no palpable lymphadenopathy BREAST:not examined LUNGS: clear to auscultation and percussion HEART: regular rate & rhythm, no murmurs, no gallops, S1 normal and S2 normal ABDOMEN:abdomen soft, obese and normal bowel sounds BACK: Back symmetric, no curvature. EXTREMITIES:less then 2 second capillary refill, no joint deformities, effusion, or inflammation, no skin discoloration, no cyanosis  NEURO: alert & oriented x 3 with fluent speech, no focal motor/sensory deficits, gait normal   LABORATORY DATA: CBC    Component Value Date/Time   WBC 33.2 (H) 12/27/2017 1022   RBC 4.66 12/27/2017 1022   HGB 11.6 (L) 12/27/2017 1022   HCT 38.6 12/27/2017 1022   PLT 243 12/27/2017 1022   MCV 82.8 12/27/2017 1022   MCH 24.9 (L) 12/27/2017 1022   MCHC 30.1 12/27/2017 1022   RDW 15.3 12/27/2017 1022   LYMPHSABS 27.0 (H) 12/27/2017 1022   MONOABS 1.3 (H) 12/27/2017 1022   EOSABS 0.3 12/27/2017 1022   BASOSABS 0.0 12/27/2017 1022      Chemistry      Component Value Date/Time   NA 138 12/27/2017 1022   K 4.3 12/27/2017 1022   CL 102 12/27/2017 1022   CO2 25 12/27/2017 1022   BUN 12 12/27/2017 1022   CREATININE 1.14 (H) 12/27/2017 1022      Component Value Date/Time   CALCIUM 9.2 12/27/2017 1022   ALKPHOS 83 12/27/2017 1022   AST 25  12/27/2017 1022   ALT 16 12/27/2017 1022   BILITOT 0.4 12/27/2017 1022       RADIOGRAPHIC STUDIES:  No results found.   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 CD38 was expressed approximately 26% of the clonal B cells which is of indeterminate prognostic significance.  Labs today: CBC diff, CMET.  I personally reviewed and went over laboratory results with the patient.  The results are noted within this dictation.  Blood gas today demonstrate a white blood cell count of 33.2 with  a lymphocytosis of 27.  Minimal anemia at 11.6 g/dL and normal platelet count of 243,000.  Metabolic panel was unimpressive except for chronic renal disease, stage III.  I am concerned of her new anemia and rising white blood cell count.  I suspect that she will need treatment in the near future.  Given her age and comorbidities, she would be a good candidate for ibrutinib therapy.  I think she would benefit from a [facility] CLL comprehensive profile and IGVH hypermutation status analysis to help guide role of therapy and prognosis.  She will consider this option.  She did not want to have it performed today.  We have the test on-site and the patient will consider having this done in the near future.  Repeat laboratory work in 4-6 months: CBC diff, CMET.  Return in 4-6 months for follow-up.    2. History of breast cancer History of left breast cancer 1999 status post lumpectomy followed by adjuvant radiation therapy.  She also took tamoxifen x4 years  3. Chronic pain syndrome On chronic methadone  4. Chronic depression Noted and stable.  Depression is persistent.  5. Rectal pain, chronic Resulting in chronic pain syndrome.  Controlled on methadone.  6. Chronic renal  disease, stage 3, moderately decreased glomerular filtration rate between 30-59 mL/min/1.73 square meter (HCC) Chronic and stable.   ORDERS PLACED FOR THIS ENCOUNTER: No orders of the defined types were placed in this encounter.   MEDICATIONS PRESCRIBED THIS ENCOUNTER: No orders of the defined types were placed in this encounter.   THERAPY PLAN:  We will continue to monitor counts.  I suspect she will need treatment in the near future given her most recent laboratory work with a minimal anemia and rising white blood cell count.  I think she would benefit from a CLL comprehensive profile/panel in addition to IgVH hyper mutation testing by [facility].  I have confirmed that we have the test on site and this can be performed if the patient decides to pursue this in the interim.  All questions were answered. The patient knows to call the clinic with any problems, questions or concerns. We can certainly see the patient much sooner if necessary.  Patient and plan discussed with Dr. Derek Jack and she is in agreement with the aforementioned.   This note is electronically signed by: Robynn Pane, PA-C 12/27/2017 12:08 PM

## 2017-12-27 NOTE — Assessment & Plan Note (Addendum)
RAI Stage 0 CLL, FLOW cytometry showing a clonal lymphocyte population CD5 and CD23+ with dim lambda light chain restriction representing more than 99% of the B-cell population consistent with CLL. CD38 was expressed approximately 26% of the clonal B cells which is of indeterminate prognostic significance.  Labs today: CBC diff, CMET.  I personally reviewed and went over laboratory results with the patient.  The results are noted within this dictation.  Blood gas today demonstrate a white blood cell count of 33.2 with a lymphocytosis of 27.  Minimal anemia at 11.6 g/dL and normal platelet count of 243,000.  Metabolic panel was unimpressive except for chronic renal disease, stage III.  I am concerned of her new anemia and rising white blood cell count.  I suspect that she will need treatment in the near future.  Given her age and comorbidities, she would be a good candidate for ibrutinib therapy.  I think she would benefit from a [facility] CLL comprehensive profile and IGVH hypermutation status analysis to help guide role of therapy and prognosis.  She will consider this option.  She did not want to have it performed today.  We have the test on-site and the patient will consider having this done in the near future.  Repeat laboratory work in 4-6 months: CBC diff, CMET.  Return in 4-6 months for follow-up.

## 2017-12-27 NOTE — Patient Instructions (Addendum)
[place] at [facility] Discharge Instructions  Today you saw Kirby Crigler PA-C.  Labs today are stable.  Some laboratory work is pending.  Laboratory work in 4-6 months.  Return in 4-6 months for follow-up.    Thank you for choosing [place] at [facility] to provide your oncology and hematology care.  To afford each patient quality time with our provider, please arrive at least 15 minutes before your scheduled appointment time.   If you have a lab appointment with the [facility] [place] please come in thru the  Main Entrance and check in at the main information desk  You need to re-schedule your appointment should you arrive 10 or more minutes late.  We strive to give you quality time with our providers, and arriving late affects you and other patients whose appointments are after yours.  Also, if you no show three or more times for appointments you may be dismissed from the clinic at the providers discretion.     Again, thank you for choosing [facility].  Our hope is that these requests will decrease the amount of time that you wait before being seen by our physicians.       _____________________________________________________________  Should you have questions after your visit to [facility], please contact our office at [phone] between the hours of 8:30 a.m. and 4:30 p.m.  Voicemails left after 4:30 p.m. will not be returned until the following business day.  For prescription refill requests, have your pharmacy contact our office.       Resources For Cancer Patients and their Caregivers ? American Cancer Society: Can assist with transportation, wigs, general needs, runs Look Good Feel Better.        5485016192 ? Cancer Care: Provides financial assistance, online support groups, medication/co-pay assistance.  1-800-813-HOPE 367-333-8779) ? [facility] Assists [place] cancer  patients and their families through emotional , educational and financial support.  (929)660-2601 ? [place] DSS Where to apply for food stamps, Medicaid and utility assistance. (660) 456-2209 ? [facility]: Transportation to medical appointments. (803)707-7843 ? Social Security Administration: May apply for disability if have a Stage IV cancer. (414) 392-2922 618 067 4928 ? [employer], Disability and Transit Services: Assists with nutrition, care and transit needs. [place] Support Programs:   > Cancer Support Group  2nd Tuesday of the month 1pm-2pm, Journey Room   > Creative Journey  3rd Tuesday of the month 1130am-1pm, Journey Room

## 2018-03-27 ENCOUNTER — Other Ambulatory Visit (HOSPITAL_COMMUNITY): Payer: Medicare Other

## 2018-03-27 ENCOUNTER — Ambulatory Visit (HOSPITAL_COMMUNITY): Payer: Medicare Other

## 2018-04-29 ENCOUNTER — Other Ambulatory Visit (HOSPITAL_COMMUNITY): Payer: Self-pay

## 2018-04-29 DIAGNOSIS — C9110 Chronic lymphocytic leukemia of B-cell type not having achieved remission: Secondary | ICD-10-CM

## 2018-04-30 ENCOUNTER — Inpatient Hospital Stay (HOSPITAL_COMMUNITY): Payer: Medicare Other

## 2018-04-30 ENCOUNTER — Encounter (HOSPITAL_COMMUNITY): Payer: Self-pay | Admitting: Hematology

## 2018-04-30 ENCOUNTER — Inpatient Hospital Stay (HOSPITAL_COMMUNITY): Payer: Medicare Other | Attending: Hematology | Admitting: Hematology

## 2018-04-30 ENCOUNTER — Other Ambulatory Visit: Payer: Self-pay

## 2018-04-30 VITALS — BP 93/70 | HR 83 | Temp 98.7°F | Resp 18 | Wt 196.0 lb

## 2018-04-30 DIAGNOSIS — I129 Hypertensive chronic kidney disease with stage 1 through stage 4 chronic kidney disease, or unspecified chronic kidney disease: Secondary | ICD-10-CM | POA: Insufficient documentation

## 2018-04-30 DIAGNOSIS — N183 Chronic kidney disease, stage 3 (moderate): Secondary | ICD-10-CM | POA: Diagnosis not present

## 2018-04-30 DIAGNOSIS — C9110 Chronic lymphocytic leukemia of B-cell type not having achieved remission: Secondary | ICD-10-CM | POA: Diagnosis present

## 2018-04-30 DIAGNOSIS — E079 Disorder of thyroid, unspecified: Secondary | ICD-10-CM | POA: Insufficient documentation

## 2018-04-30 DIAGNOSIS — Z853 Personal history of malignant neoplasm of breast: Secondary | ICD-10-CM | POA: Insufficient documentation

## 2018-04-30 LAB — CBC WITH DIFFERENTIAL/PLATELET
BASOS PCT: 0 %
Basophils Absolute: 0.1 10*3/uL (ref 0.0–0.1)
EOS ABS: 0.3 10*3/uL (ref 0.0–0.7)
EOS PCT: 1 %
HCT: 39.5 % (ref 36.0–46.0)
Hemoglobin: 12 g/dL (ref 12.0–15.0)
Lymphocytes Relative: 88 %
Lymphs Abs: 41.8 10*3/uL (ref 0.7–4.0)
MCH: 26.3 pg (ref 26.0–34.0)
MCHC: 30.4 g/dL (ref 30.0–36.0)
MCV: 86.6 fL (ref 78.0–100.0)
Monocytes Absolute: 1.3 10*3/uL (ref 0.1–1.0)
Monocytes Relative: 3 %
Neutro Abs: 3.6 10*3/uL (ref 1.7–7.7)
Neutrophils Relative %: 8 %
PLATELETS: 213 10*3/uL (ref 150–400)
RBC: 4.56 MIL/uL (ref 3.87–5.11)
RDW: 16.3 % — AB (ref 11.5–15.5)
WBC: 47 10*3/uL — ABNORMAL HIGH (ref 4.0–10.5)

## 2018-04-30 LAB — COMPREHENSIVE METABOLIC PANEL
ALBUMIN: 4 g/dL (ref 3.5–5.0)
ALT: 18 U/L (ref 0–44)
AST: 25 U/L (ref 15–41)
Alkaline Phosphatase: 74 U/L (ref 38–126)
Anion gap: 7 (ref 5–15)
BUN: 11 mg/dL (ref 8–23)
CHLORIDE: 104 mmol/L (ref 98–111)
CO2: 28 mmol/L (ref 22–32)
CREATININE: 1.2 mg/dL — AB (ref 0.44–1.00)
Calcium: 9 mg/dL (ref 8.9–10.3)
GFR calc Af Amer: 52 mL/min — ABNORMAL LOW (ref >60)
GFR, EST NON AFRICAN AMERICAN: 45 mL/min — AB (ref >60)
Glucose, Bld: 110 mg/dL — ABNORMAL HIGH (ref 70–99)
Potassium: 4.3 mmol/L (ref 3.5–5.1)
SODIUM: 139 mmol/L (ref 135–145)
Total Bilirubin: 0.6 mg/dL (ref 0.3–1.2)
Total Protein: 7.3 g/dL (ref 6.5–8.1)

## 2018-04-30 NOTE — Assessment & Plan Note (Signed)
1.  Stage I CLL: - Diagnosis by flow cytometry showing a clonal lymphocyte palpation of CD5 positive and CD23 positive with dim lambda light chain restriction. - Denies any B symptoms including fevers, night sweats or weight loss.  Denies any recurrent infections. -Physical examination today revealed one lymph node in the left neck region.  No palpable splenomegaly. - I went over the blood work with her today.  Hemoglobin is 12.  Platelet count is 213.  White count went up from 33-47 in the last 4 months.  Lymphocyte doubling time is more than 6 months. - I will continue to monitor closely.  She will come back in 4 months for follow-up.  2.  Left breast cancer: -She had a history of left breast cancer in 1999, status post lumpectomy and radiation therapy followed by 4 years of tamoxifen. -She gets yearly mammograms in [place].

## 2018-04-30 NOTE — Patient Instructions (Signed)
[facility] at [facility] Discharge Instructions Follow up in 4 months with labs    Thank you for choosing [facility] at [facility] to provide your oncology and hematology care.  To afford each patient quality time with our provider, please arrive at least 15 minutes before your scheduled appointment time.   If you have a lab appointment with the Cancer Center please come in thru the  Main Entrance and check in at the main information desk  You need to re-schedule your appointment should you arrive 10 or more minutes late.  We strive to give you quality time with our providers, and arriving late affects you and other patients whose appointments are after yours.  Also, if you no show three or more times for appointments you may be dismissed from the clinic at the providers discretion.     Again, thank you for choosing [facility].  Our hope is that these requests will decrease the amount of time that you wait before being seen by our physicians.       _____________________________________________________________  Should you have questions after your visit to [facility], please contact our office at [phone] between the hours of 8:00 a.m. and 4:30 p.m.  Voicemails left after 4:00 p.m. will not be returned until the following business day.  For prescription refill requests, have your pharmacy contact our office and allow 72 hours.    Cancer Center Support Programs:   > Cancer Support Group  2nd Tuesday of the month 1pm-2pm, Journey Room    

## 2018-04-30 NOTE — Progress Notes (Signed)
[place],  [address]   CLINIC:  Medical Oncology/Hematology  PCP:  Patient, No Pcp Per No address on file None   REASON FOR VISIT:  Follow-up for CLL  CURRENT THERAPY: observation   INTERVAL HISTORY:  Renee [name] 72 y.o. female returns for routine follow-up for CLL. Patient is here today with her daughter. She does report fatigue at times with weakness. Patient denies any fevers, night sweats, recent infections, or chills. Denies any nausea, vomiting, or diarrhea. Patient reports her appetite and energy level at 50%. She is maintaining her weight at this time.     REVIEW OF SYSTEMS:  Review of Systems  Constitutional: Positive for fatigue.  Neurological: Positive for extremity weakness and numbness (burning right foot).  All other systems reviewed and are negative.    PAST MEDICAL/SURGICAL HISTORY:  Past Medical History:  Diagnosis Date  . Chronic renal disease, stage 3, moderately decreased glomerular filtration rate between 30-59 mL/min/1.73 square meter ([place]) 12/27/2017  . Constipation   . Depression   . History of breast cancer 12/27/2017  . Hypertension   . Leukemia ([place])    CLL  . PVD (peripheral vascular disease) ([place])   . Thyroid disease    Past Surgical History:  Procedure Laterality Date  . ABDOMINAL HYSTERECTOMY    . BREAST SURGERY     Left Breast Lumpectomy  . COLON SURGERY    . HERNIA REPAIR  2000   Hiatal hernia repair       SOCIAL HISTORY:  Social History   Socioeconomic History  . Marital status: Unknown    Spouse name: Not on file  . Number of children: Not on file  . Years of education: Not on file  . Highest education level: Not on file  Occupational History  . Not on file  Social Needs  . Financial resource strain: Not on file  . Food insecurity:    Worry: Not on file    Inability: Not on file  . Transportation needs:    Medical: Not on file    Non-medical: Not on file  Tobacco Use   . Smoking status: Former Smoker    Packs/day: 0.75    Types: Cigarettes    Start date: 09/19/1962    Last attempt to quit: 09/20/1979    Years since quitting: 38.6  . Smokeless tobacco: Never Used  Substance and Sexual Activity  . Alcohol use: No    Alcohol/week: 0.0 standard drinks  . Drug use: No  . Sexual activity: Never  Lifestyle  . Physical activity:    Days per week: Not on file    Minutes per session: Not on file  . Stress: Not on file  Relationships  . Social connections:    Talks on phone: Not on file    Gets together: Not on file    Attends religious service: Not on file    Active member of club or organization: Not on file    Attends meetings of clubs or organizations: Not on file    Relationship status: Not on file  . Intimate partner violence:    Fear of current or ex partner: Not on file    Emotionally abused: Not on file    Physically abused: Not on file    Forced sexual activity: Not on file  Other Topics Concern  . Not on file  Social History Narrative  . Not on file    FAMILY HISTORY:  History reviewed.  No pertinent family history.  CURRENT MEDICATIONS:  Outpatient Encounter Medications as of 04/30/2018  Medication Sig  . Calcium Carbonate-Vitamin D (CALCIUM HIGH POTENCY/VITAMIN D) 600-200 MG-UNIT TABS Take by mouth.  . clonazePAM (KLONOPIN) 0.5 MG tablet Take 0.5 mg by mouth at bedtime.  . cloNIDine (CATAPRES) 0.1 MG tablet Take 0.1 mg by mouth every 8 (eight) hours.  . cloNIDine-chlorthalidone (CLORPRES) 0.2-15 MG tablet Take 1 tablet by mouth daily.  [name] Kitchen conjugated estrogens (PREMARIN) vaginal cream Place 1 Applicatorful vaginally 2 (two) times a week.  . DULoxetine (CYMBALTA) 60 MG capsule Take 60 mg by mouth daily.  . IBU 600 MG tablet Take 600 mg by mouth every 6 (six) hours as needed. for pain  . KRILL OIL PO Take 900 mg by mouth daily.  [name] Kitchen levothyroxine (SYNTHROID, LEVOTHROID) 100 MCG tablet Take 100 mcg by mouth daily before breakfast.  .  lurasidone (LATUDA) 40 MG TABS tablet Take 40 mg by mouth daily with breakfast.  . methadone (DOLOPHINE) 10 MG tablet Take 15 mg by mouth every 12 (twelve) hours.   . pantoprazole (PROTONIX) 40 MG tablet Take 40 mg by mouth daily.  . polyethylene glycol (MIRALAX / GLYCOLAX) packet Take 17 g by mouth daily.  . ranitidine (ZANTAC) 150 MG tablet Take 150 mg by mouth at bedtime.  . rosuvastatin (CRESTOR) 20 MG tablet Take 20 mg by mouth daily.   No facility-administered encounter medications on file as of 04/30/2018.     ALLERGIES:  Allergies  Allergen Reactions  . Bupropion Other (See Comments)    Fidgety   . Clonidine Derivatives Other (See Comments)  . Codeine Nausea Only  . Diclofenac-Misoprostol Other (See Comments)    Fidgety   . Olanzapine Other (See Comments)    Fidgety   . Pregabalin Other (See Comments) and Rash  . Valdecoxib Other (See Comments)  . Zonisamide Other (See Comments)    Fidgety      PHYSICAL EXAM:  ECOG Performance status: 1  Vitals:   04/30/18 1419  BP: 93/70  Pulse: 83  Resp: 18  Temp: 98.7 F (37.1 C)  SpO2: 97%   Filed Weights   04/30/18 1419  Weight: 196 lb (88.9 kg)    Physical Exam  Constitutional: She is oriented to person, place, and time. She appears well-developed and well-nourished.  Neck: Normal range of motion. Neck supple.  Cardiovascular: Normal rate, regular rhythm and normal heart sounds.  Pulmonary/Chest: Effort normal and breath sounds normal.  Musculoskeletal: Normal range of motion.  Neurological: She is alert and oriented to person, place, and time.  Skin: Skin is warm and dry.  Psychiatric: She has a normal mood and affect. Her behavior is normal. Judgment and thought content normal.  There is 1 subcentimeter lymph node in the left neck palpable.  No palpable splenomegaly.   LABORATORY DATA:  I have reviewed the labs as listed.  CBC    Component Value Date/Time   WBC 47.0 (H) 04/30/2018 1305   RBC 4.56  04/30/2018 1305   HGB 12.0 04/30/2018 1305   HCT 39.5 04/30/2018 1305   PLT 213 04/30/2018 1305   MCV 86.6 04/30/2018 1305   MCH 26.3 04/30/2018 1305   MCHC 30.4 04/30/2018 1305   RDW 16.3 (H) 04/30/2018 1305   LYMPHSABS 41.8 04/30/2018 1305   MONOABS 1.3 04/30/2018 1305   EOSABS 0.3 04/30/2018 1305   BASOSABS 0.1 04/30/2018 1305   CMP Latest Ref Rng & Units 04/30/2018 12/27/2017 09/27/2017  Glucose 70 - 99  mg/dL 110(H) 118(H) 102(H)  BUN 8 - 23 mg/dL 11 12 10   Creatinine 0.44 - 1.00 mg/dL 1.20(H) 1.14(H) 1.07(H)  Sodium 135 - 145 mmol/L 139 138 138  Potassium 3.5 - 5.1 mmol/L 4.3 4.3 4.4  Chloride 98 - 111 mmol/L 104 102 102  CO2 22 - 32 mmol/L 28 25 27   Calcium 8.9 - 10.3 mg/dL 9.0 9.2 9.1  Total Protein 6.5 - 8.1 g/dL 7.3 6.6 7.1  Total Bilirubin 0.3 - 1.2 mg/dL 0.6 0.4 0.5  Alkaline Phos 38 - 126 U/L 74 83 88  AST 15 - 41 U/L 25 25 28   ALT 0 - 44 U/L 18 16 18        ASSESSMENT & PLAN:   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 - Denies any B symptoms including fevers, night sweats or weight loss.  Denies any recurrent infections. -Physical examination today revealed one lymph node in the left neck region.  No palpable splenomegaly. - I went over the blood work with her today.  Hemoglobin is 12.  Platelet count is 213.  White count went up from 33-47 in the last 4 months.  Lymphocyte doubling time is more than 6 months. - I will continue to monitor closely.  She will come back in 4 months for follow-up.  2.  Left breast cancer: -She had a history of left breast cancer in 1999, status post lumpectomy and radiation therapy followed by 4 years of tamoxifen. -She gets yearly mammograms in [place].      Orders placed this encounter:  Orders Placed This Encounter  Procedures  . CBC with Differential/Platelet  .  Comprehensive metabolic panel  . Lactate dehydrogenase      Derek Jack, MD [place] [phone]

## 2018-09-01 ENCOUNTER — Inpatient Hospital Stay (HOSPITAL_COMMUNITY): Payer: Medicare Other | Attending: Hematology

## 2018-09-01 ENCOUNTER — Encounter (HOSPITAL_COMMUNITY): Payer: Self-pay | Admitting: Hematology

## 2018-09-01 ENCOUNTER — Inpatient Hospital Stay (HOSPITAL_COMMUNITY): Payer: Medicare Other | Admitting: Hematology

## 2018-09-01 VITALS — BP 109/53 | HR 86 | Temp 98.1°F | Resp 18 | Wt 193.9 lb

## 2018-09-01 DIAGNOSIS — Z923 Personal history of irradiation: Secondary | ICD-10-CM | POA: Insufficient documentation

## 2018-09-01 DIAGNOSIS — N183 Chronic kidney disease, stage 3 (moderate): Secondary | ICD-10-CM | POA: Diagnosis not present

## 2018-09-01 DIAGNOSIS — I10 Essential (primary) hypertension: Secondary | ICD-10-CM

## 2018-09-01 DIAGNOSIS — Z853 Personal history of malignant neoplasm of breast: Secondary | ICD-10-CM | POA: Insufficient documentation

## 2018-09-01 DIAGNOSIS — C9110 Chronic lymphocytic leukemia of B-cell type not having achieved remission: Secondary | ICD-10-CM | POA: Insufficient documentation

## 2018-09-01 DIAGNOSIS — Z87891 Personal history of nicotine dependence: Secondary | ICD-10-CM | POA: Diagnosis not present

## 2018-09-01 DIAGNOSIS — Z79899 Other long term (current) drug therapy: Secondary | ICD-10-CM

## 2018-09-01 LAB — CBC WITH DIFFERENTIAL/PLATELET
BASOS ABS: 0 10*3/uL (ref 0.0–0.1)
BASOS PCT: 0 %
EOS ABS: 0 10*3/uL (ref 0.0–0.5)
EOS PCT: 0 %
HCT: 41.4 % (ref 36.0–46.0)
Hemoglobin: 12 g/dL (ref 12.0–15.0)
Lymphocytes Relative: 90 %
Lymphs Abs: 49.4 10*3/uL — ABNORMAL HIGH (ref 0.7–4.0)
MCH: 25.6 pg — ABNORMAL LOW (ref 26.0–34.0)
MCHC: 29 g/dL — ABNORMAL LOW (ref 30.0–36.0)
MCV: 88.5 fL (ref 80.0–100.0)
Monocytes Absolute: 0.9 10*3/uL (ref 0.1–1.0)
Monocytes Relative: 2 %
NEUTROS PCT: 8 %
NRBC: 0 /100{WBCs}
NRBC: 0.1 % (ref 0.0–0.2)
Neutro Abs: 4.4 10*3/uL (ref 1.7–7.7)
PLATELETS: 217 10*3/uL (ref 150–400)
RBC: 4.68 MIL/uL (ref 3.87–5.11)
RDW: 15.3 % (ref 11.5–15.5)
WBC: 54.6 10*3/uL — AB (ref 4.0–10.5)

## 2018-09-01 LAB — LACTATE DEHYDROGENASE: LDH: 157 U/L (ref 98–192)

## 2018-09-01 LAB — COMPREHENSIVE METABOLIC PANEL
ALT: 18 U/L (ref 0–44)
AST: 25 U/L (ref 15–41)
Albumin: 4.1 g/dL (ref 3.5–5.0)
Alkaline Phosphatase: 67 U/L (ref 38–126)
Anion gap: 6 (ref 5–15)
BUN: 11 mg/dL (ref 8–23)
CHLORIDE: 105 mmol/L (ref 98–111)
CO2: 28 mmol/L (ref 22–32)
CREATININE: 1.21 mg/dL — AB (ref 0.44–1.00)
Calcium: 9.1 mg/dL (ref 8.9–10.3)
GFR calc Af Amer: 52 mL/min — ABNORMAL LOW (ref >60)
GFR, EST NON AFRICAN AMERICAN: 45 mL/min — AB (ref >60)
Glucose, Bld: 85 mg/dL (ref 70–99)
POTASSIUM: 4.3 mmol/L (ref 3.5–5.1)
SODIUM: 139 mmol/L (ref 135–145)
Total Bilirubin: 0.3 mg/dL (ref 0.3–1.2)
Total Protein: 6.9 g/dL (ref 6.5–8.1)

## 2018-09-01 NOTE — Progress Notes (Signed)
[name],  [id number]   CLINIC:  Medical Oncology/Hematology  PCP:  Patient, No Pcp Per No address on file None   REASON FOR VISIT: Follow-up for CLL  CURRENT THERAPY: Observation    INTERVAL HISTORY:  Renee [name] 72 y.o. female returns for routine follow-up for CLL. She is here today and doing well. She does report her activities level is low due to her chronic rectal pain. She sits on a soft pillow to easy the pain. She lays a lot at home. She is on methadone daily for her chronic pain. Denies any nausea, vomiting, or diarrhea. Had not noticed any recent bleeding such as epistaxis, hematuria or hematochezia. Denies recent chest pain on exertion, shortness of breath on minimal exertion, pre-syncopal episodes, or palpitations. Denies any numbness or tingling in hands or feet. Denies any recent fevers, infections, or recent hospitalizations. She reports she has no appetite and her energy level 25%.     REVIEW OF SYSTEMS:  Review of Systems  All other systems reviewed and are negative.    PAST MEDICAL/SURGICAL HISTORY:  Past Medical History:  Diagnosis Date  . Chronic renal disease, stage 3, moderately decreased glomerular filtration rate between 30-59 mL/min/1.73 square meter ([place]) 12/27/2017  . Constipation   . Depression   . History of breast cancer 12/27/2017  . Hypertension   . Leukemia ([place])    CLL  . PVD (peripheral vascular disease) ([place])   . Thyroid disease    Past Surgical History:  Procedure Laterality Date  . ABDOMINAL HYSTERECTOMY    . BREAST SURGERY     Left Breast Lumpectomy  . COLON SURGERY    . HERNIA REPAIR  2000   Hiatal hernia repair       SOCIAL HISTORY:  Social History   Socioeconomic History  . Marital status: Unknown    Spouse name: Not on file  . Number of children: Not on file  . Years of education: Not on file  . Highest education level: Not on file  Occupational History  . Not on file    Social Needs  . Financial resource strain: Not on file  . Food insecurity:    Worry: Not on file    Inability: Not on file  . Transportation needs:    Medical: Not on file    Non-medical: Not on file  Tobacco Use  . Smoking status: Former Smoker    Packs/day: 0.75    Types: Cigarettes    Start date: 09/19/1962    Last attempt to quit: 09/20/1979    Years since quitting: 38.9  . Smokeless tobacco: Never Used  Substance and Sexual Activity  . Alcohol use: No    Alcohol/week: 0.0 standard drinks  . Drug use: No  . Sexual activity: Never  Lifestyle  . Physical activity:    Days per week: Not on file    Minutes per session: Not on file  . Stress: Not on file  Relationships  . Social connections:    Talks on phone: Not on file    Gets together: Not on file    Attends religious service: Not on file    Active member of club or organization: Not on file    Attends meetings of clubs or organizations: Not on file    Relationship status: Not on file  . Intimate partner violence:    Fear of current or ex partner: Not on file    Emotionally abused: Not  on file    Physically abused: Not on file    Forced sexual activity: Not on file  Other Topics Concern  . Not on file  Social History Narrative  . Not on file    FAMILY HISTORY:  History reviewed. No pertinent family history.  CURRENT MEDICATIONS:  Outpatient Encounter Medications as of 09/01/2018  Medication Sig  . Calcium Carbonate-Vitamin D (CALCIUM HIGH POTENCY/VITAMIN D) 600-200 MG-UNIT TABS Take by mouth.  . clonazePAM (KLONOPIN) 0.5 MG tablet Take 0.5 mg by mouth at bedtime.  . cloNIDine (CATAPRES) 0.1 MG tablet Take 0.1 mg by mouth every 8 (eight) hours.  . cloNIDine-chlorthalidone (CLORPRES) 0.2-15 MG tablet Take 1 tablet by mouth daily.  [name] Kitchen conjugated estrogens (PREMARIN) vaginal cream Place 1 Applicatorful vaginally 2 (two) times a week.  . DULoxetine (CYMBALTA) 60 MG capsule Take 60 mg by mouth daily.  [name] Kitchen  esomeprazole (NEXIUM) 40 MG capsule Take by mouth.  . IBU 600 MG tablet Take 600 mg by mouth every 6 (six) hours as needed. for pain  . KRILL OIL PO Take 900 mg by mouth daily.  [name] Kitchen levothyroxine (SYNTHROID, LEVOTHROID) 100 MCG tablet Take 100 mcg by mouth daily before breakfast.  . lurasidone (LATUDA) 40 MG TABS tablet Take 40 mg by mouth daily with breakfast.  . methadone (DOLOPHINE) 10 MG tablet Take 15 mg by mouth every 12 (twelve) hours.   . pantoprazole (PROTONIX) 40 MG tablet Take 40 mg by mouth daily.  . polyethylene glycol (MIRALAX / GLYCOLAX) packet Take 17 g by mouth daily.  . ranitidine (ZANTAC) 150 MG tablet Take 150 mg by mouth at bedtime.  . rosuvastatin (CRESTOR) 20 MG tablet Take 20 mg by mouth daily.   No facility-administered encounter medications on file as of 09/01/2018.     ALLERGIES:  Allergies  Allergen Reactions  . Bupropion Other (See Comments)    Fidgety   . Clonidine Derivatives Other (See Comments)  . Codeine Nausea Only  . Diclofenac-Misoprostol Other (See Comments)    Fidgety   . Olanzapine Other (See Comments)    Fidgety   . Pregabalin Other (See Comments) and Rash  . Valdecoxib Other (See Comments)  . Zonisamide Other (See Comments)    Fidgety      PHYSICAL EXAM:  ECOG Performance status: 1  Vitals:   09/01/18 1135  BP: (!) 109/53  Pulse: 86  Resp: 18  Temp: 98.1 F (36.7 C)  SpO2: 98%   Filed Weights   09/01/18 1135  Weight: 193 lb 14.4 oz (88 kg)    Physical Exam Constitutional:      Appearance: Normal appearance. She is normal weight.  Musculoskeletal: Normal range of motion.  Skin:    General: Skin is warm and dry.  Neurological:     Mental Status: She is alert and oriented to person, place, and time. Mental status is at baseline.  Psychiatric:        Mood and Affect: Mood normal.        Behavior: Behavior normal.        Thought Content: Thought content normal.        Judgment: Judgment normal.   There is a 1 cm left  mid neck lymph node which is unchanged.  No other palpable adenopathy.  No palpable hepatosplenomegaly.   LABORATORY DATA:  I have reviewed the labs as listed.  CBC    Component Value Date/Time   WBC 54.6 (HH) 09/01/2018 1113   RBC 4.68 09/01/2018 1113  HGB 12.0 09/01/2018 1113   HCT 41.4 09/01/2018 1113   PLT 217 09/01/2018 1113   MCV 88.5 09/01/2018 1113   MCH 25.6 (L) 09/01/2018 1113   MCHC 29.0 (L) 09/01/2018 1113   RDW 15.3 09/01/2018 1113   LYMPHSABS 49.4 (H) 09/01/2018 1113   MONOABS 0.9 09/01/2018 1113   EOSABS 0.0 09/01/2018 1113   BASOSABS 0.0 09/01/2018 1113   CMP Latest Ref Rng & Units 09/01/2018 04/30/2018 12/27/2017  Glucose 70 - 99 mg/dL 85 110(H) 118(H)  BUN 8 - 23 mg/dL 11 11 12   Creatinine 0.44 - 1.00 mg/dL 1.21(H) 1.20(H) 1.14(H)  Sodium 135 - 145 mmol/L 139 139 138  Potassium 3.5 - 5.1 mmol/L 4.3 4.3 4.3  Chloride 98 - 111 mmol/L 105 104 102  CO2 22 - 32 mmol/L 28 28 25   Calcium 8.9 - 10.3 mg/dL 9.1 9.0 9.2  Total Protein 6.5 - 8.1 g/dL 6.9 7.3 6.6  Total Bilirubin 0.3 - 1.2 mg/dL 0.3 0.6 0.4  Alkaline Phos 38 - 126 U/L 67 74 83  AST 15 - 41 U/L 25 25 25   ALT 0 - 44 U/L 18 18 16        DIAGNOSTIC IMAGING:  I have independently reviewed the scans and discussed with the patient.   I have reviewed Francene Finders, NP's note and agree with the documentation.  I personally performed a face-to-face visit, made revisions and my assessment and plan is as follows.    ASSESSMENT & PLAN:   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 - Denies any recurrent infections.  Denies any B symptoms including fevers, night sweats or weight loss. -Physical examination today reveals 1 lymph node in the left mid neck region.  No palpable lymphadenopathy elsewhere or splenomegaly. -We discussed the results of the  CBC from today.  White count is 55 with predominant lymphocytosis.  No other cytopenias were seen.  LDH was within normal limits. -I will see her back in 6 months for follow-up.  She was told to come back sooner if she develops any B symptoms or recurrent infections.  2.  Left breast cancer: -She had a history of left breast cancer in 1999, status post lumpectomy and radiation therapy followed by 4 years of tamoxifen. -She gets yearly mammograms in [place].  3.  CKD: -She has mild CKD.  I have advised her to avoid any nephrotoxic medication.      Orders placed this encounter:  Orders Placed This Encounter  Procedures  . Lactate dehydrogenase  . CBC with Differential/Platelet  . Comprehensive metabolic panel      Derek Jack, MD [place] [phone]

## 2018-09-01 NOTE — Assessment & Plan Note (Addendum)
1.  Stage I CLL: - Diagnosis by flow cytometry showing a clonal lymphocyte palpation of CD5 positive and CD23 positive with dim lambda light chain restriction. - Denies any recurrent infections.  Denies any B symptoms including fevers, night sweats or weight loss. -Physical examination today reveals 1 lymph node in the left mid neck region.  No palpable lymphadenopathy elsewhere or splenomegaly. -We discussed the results of the CBC from today.  White count is 55 with predominant lymphocytosis.  No other cytopenias were seen.  LDH was within normal limits. -I will see her back in 6 months for follow-up.  She was told to come back sooner if she develops any B symptoms or recurrent infections.  2.  Left breast cancer: -She had a history of left breast cancer in 1999, status post lumpectomy and radiation therapy followed by 4 years of tamoxifen. -She gets yearly mammograms in [place].  3.  CKD: -She has mild CKD.  I have advised her to avoid any nephrotoxic medication.

## 2018-09-01 NOTE — Patient Instructions (Signed)
[facility] at [facility] Discharge Instructions  Follow up in 6 months with labs    Thank you for choosing [facility] at [facility] to provide your oncology and hematology care.  To afford each patient quality time with our provider, please arrive at least 15 minutes before your scheduled appointment time.   If you have a lab appointment with the Cancer Center please come in thru the  Main Entrance and check in at the main information desk  You need to re-schedule your appointment should you arrive 10 or more minutes late.  We strive to give you quality time with our providers, and arriving late affects you and other patients whose appointments are after yours.  Also, if you no show three or more times for appointments you may be dismissed from the clinic at the providers discretion.     Again, thank you for choosing [facility].  Our hope is that these requests will decrease the amount of time that you wait before being seen by our physicians.       _____________________________________________________________  Should you have questions after your visit to  Cancer Center, please contact our office at [phone] between the hours of 8:00 a.m. and 4:30 p.m.  Voicemails left after 4:00 p.m. will not be returned until the following business day.  For prescription refill requests, have your pharmacy contact our office and allow 72 hours.    Cancer Center Support Programs:   > Cancer Support Group  2nd Tuesday of the month 1pm-2pm, Journey Room    

## 2018-09-01 NOTE — Progress Notes (Unsigned)
CRITICAL VALUE ALERT  Critical Value:  WBC 54.9  Date & Time Notied:  09/01/2018 11:53am  Provider Notified: Delton Coombes  Orders Received/Actions taken: Reported to MD.

## 2019-02-02 ENCOUNTER — Ambulatory Visit (HOSPITAL_COMMUNITY): Payer: Medicare Other | Admitting: Hematology

## 2019-03-04 ENCOUNTER — Ambulatory Visit (HOSPITAL_COMMUNITY): Payer: Medicare Other | Admitting: Hematology

## 2019-03-04 ENCOUNTER — Other Ambulatory Visit (HOSPITAL_COMMUNITY): Payer: Medicare Other

## 2019-03-05 ENCOUNTER — Inpatient Hospital Stay (HOSPITAL_COMMUNITY): Payer: Medicare Other | Attending: Hematology

## 2019-03-05 ENCOUNTER — Inpatient Hospital Stay (HOSPITAL_COMMUNITY): Payer: Medicare Other | Admitting: Hematology

## 2019-03-05 ENCOUNTER — Encounter (HOSPITAL_COMMUNITY): Payer: Self-pay | Admitting: Hematology

## 2019-03-05 ENCOUNTER — Ambulatory Visit (HOSPITAL_COMMUNITY): Payer: Medicare Other | Admitting: Hematology

## 2019-03-05 ENCOUNTER — Other Ambulatory Visit: Payer: Self-pay

## 2019-03-05 VITALS — BP 93/67 | HR 107 | Temp 97.9°F | Resp 16 | Wt 196.5 lb

## 2019-03-05 DIAGNOSIS — N183 Chronic kidney disease, stage 3 (moderate): Secondary | ICD-10-CM

## 2019-03-05 DIAGNOSIS — Z853 Personal history of malignant neoplasm of breast: Secondary | ICD-10-CM

## 2019-03-05 DIAGNOSIS — C9110 Chronic lymphocytic leukemia of B-cell type not having achieved remission: Secondary | ICD-10-CM | POA: Insufficient documentation

## 2019-03-05 LAB — CBC WITH DIFFERENTIAL/PLATELET
Abs Immature Granulocytes: 0.13 10*3/uL — ABNORMAL HIGH (ref 0.00–0.07)
Basophils Absolute: 0.2 10*3/uL — ABNORMAL HIGH (ref 0.0–0.1)
Basophils Relative: 0 %
Eosinophils Absolute: 0.2 10*3/uL (ref 0.0–0.5)
Eosinophils Relative: 0 %
HCT: 40.4 % (ref 36.0–46.0)
Hemoglobin: 11.6 g/dL — ABNORMAL LOW (ref 12.0–15.0)
Immature Granulocytes: 0 %
Lymphocytes Relative: 91 %
Lymphs Abs: 65.3 10*3/uL — ABNORMAL HIGH (ref 0.7–4.0)
MCH: 25.7 pg — ABNORMAL LOW (ref 26.0–34.0)
MCHC: 28.7 g/dL — ABNORMAL LOW (ref 30.0–36.0)
MCV: 89.4 fL (ref 80.0–100.0)
Monocytes Absolute: 1.9 10*3/uL — ABNORMAL HIGH (ref 0.1–1.0)
Monocytes Relative: 3 %
Neutro Abs: 4.5 10*3/uL (ref 1.7–7.7)
Neutrophils Relative %: 6 %
Platelets: 261 10*3/uL (ref 150–400)
RBC: 4.52 MIL/uL (ref 3.87–5.11)
RDW: 15.3 % (ref 11.5–15.5)
WBC: 72.2 10*3/uL (ref 4.0–10.5)
nRBC: 0 % (ref 0.0–0.2)

## 2019-03-05 LAB — COMPREHENSIVE METABOLIC PANEL
ALT: 14 U/L (ref 0–44)
AST: 22 U/L (ref 15–41)
Albumin: 4.1 g/dL (ref 3.5–5.0)
Alkaline Phosphatase: 74 U/L (ref 38–126)
Anion gap: 8 (ref 5–15)
BUN: 11 mg/dL (ref 8–23)
CO2: 25 mmol/L (ref 22–32)
Calcium: 8.8 mg/dL — ABNORMAL LOW (ref 8.9–10.3)
Chloride: 108 mmol/L (ref 98–111)
Creatinine, Ser: 1.22 mg/dL — ABNORMAL HIGH (ref 0.44–1.00)
GFR calc Af Amer: 52 mL/min — ABNORMAL LOW (ref >60)
GFR calc non Af Amer: 45 mL/min — ABNORMAL LOW (ref >60)
Glucose, Bld: 119 mg/dL — ABNORMAL HIGH (ref 70–99)
Potassium: 4.1 mmol/L (ref 3.5–5.1)
Sodium: 141 mmol/L (ref 135–145)
Total Bilirubin: 0.6 mg/dL (ref 0.3–1.2)
Total Protein: 6.8 g/dL (ref 6.5–8.1)

## 2019-03-05 LAB — LACTATE DEHYDROGENASE: LDH: 171 U/L (ref 98–192)

## 2019-03-05 NOTE — Progress Notes (Signed)
[place], [place] [address]   CLINIC:  Medical Oncology/Hematology  PCP:  Patient, No Pcp Per No address on file None   REASON FOR VISIT:  Follow-up for CLL/SLL  CURRENT THERAPY: Clinical surveillance    INTERVAL HISTORY:  [name] 73 y.o. female presents today for follow-up.  Reports overall doing well.  Denies any significant fatigue.  Reports left supraclavicular lymphadenopathy that is been present over the last 3 months.  She denies any fevers, chills, night sweats.  No weight loss.  She remains physically active.  Appetite is stable.   REVIEW OF SYSTEMS:  Review of Systems  Constitutional: Negative.   HENT:  Negative.   Eyes: Negative.   Respiratory: Negative.   Cardiovascular: Negative.   Gastrointestinal: Negative.   Endocrine: Negative.   Genitourinary: Negative.    Musculoskeletal: Negative.   Skin: Negative.   Neurological: Negative.   Hematological: Positive for adenopathy.  Psychiatric/Behavioral: Negative.      PAST MEDICAL/SURGICAL HISTORY:  Past Medical History:  Diagnosis Date  . Chronic renal disease, stage 3, moderately decreased glomerular filtration rate between 30-59 mL/min/1.73 square meter ([place]) 12/27/2017  . Constipation   . Depression   . History of breast cancer 12/27/2017  . Hypertension   . Leukemia ([place])    CLL  . PVD (peripheral vascular disease) ([place])   . Thyroid disease    Past Surgical History:  Procedure Laterality Date  . ABDOMINAL HYSTERECTOMY    . BREAST SURGERY     Left Breast Lumpectomy  . COLON SURGERY    . HERNIA REPAIR  2000   Hiatal hernia repair       SOCIAL HISTORY:  Social History   Socioeconomic History  . Marital status: Unknown    Spouse name: Not on file  . Number of children: Not on file  . Years of education: Not on file  . Highest education level: Not on file  Occupational History  . Not on file  Social Needs  . Financial resource strain: Not on  file  . Food insecurity    Worry: Not on file    Inability: Not on file  . Transportation needs    Medical: Not on file    Non-medical: Not on file  Tobacco Use  . Smoking status: Former Smoker    Packs/day: 0.75    Types: Cigarettes    Start date: 09/19/1962    Quit date: 09/20/1979    Years since quitting: 39.4  . Smokeless tobacco: Never Used  Substance and Sexual Activity  . Alcohol use: No    Alcohol/week: 0.0 standard drinks  . Drug use: No  . Sexual activity: Never  Lifestyle  . Physical activity    Days per week: Not on file    Minutes per session: Not on file  . Stress: Not on file  Relationships  . Social Herbalist on phone: Not on file    Gets together: Not on file    Attends religious service: Not on file    Active member of club or organization: Not on file    Attends meetings of clubs or organizations: Not on file    Relationship status: Not on file  . Intimate partner violence    Fear of current or ex partner: Not on file    Emotionally abused: Not on file    Physically abused: Not on file    Forced sexual activity: Not on file  Other Topics Concern  . Not on file  Social History Narrative  . Not on file    FAMILY HISTORY:  History reviewed. No pertinent family history.  CURRENT MEDICATIONS:  Outpatient Encounter Medications as of 03/05/2019  Medication Sig  . Calcium Carbonate-Vitamin D (CALCIUM HIGH POTENCY/VITAMIN D) 600-200 MG-UNIT TABS Take by mouth.  . clonazePAM (KLONOPIN) 0.5 MG tablet Take 0.5 mg by mouth at bedtime.  . cloNIDine (CATAPRES) 0.1 MG tablet Take 0.1 mg by mouth every 8 (eight) hours.  . cloNIDine-chlorthalidone (CLORPRES) 0.2-15 MG tablet Take 1 tablet by mouth daily.  [name] Kitchen conjugated estrogens (PREMARIN) vaginal cream Place 1 Applicatorful vaginally 2 (two) times a week.  . DULoxetine (CYMBALTA) 60 MG capsule Take 60 mg by mouth daily.  [name] Kitchen esomeprazole (NEXIUM) 40 MG capsule Take by mouth.  . IBU 600 MG tablet Take 600  mg by mouth every 6 (six) hours as needed. for pain  . KRILL OIL PO Take 900 mg by mouth daily.  [name] Kitchen levothyroxine (SYNTHROID, LEVOTHROID) 100 MCG tablet Take 100 mcg by mouth daily before breakfast.  . lurasidone (LATUDA) 40 MG TABS tablet Take 40 mg by mouth daily with breakfast.  . methadone (DOLOPHINE) 10 MG tablet Take 15 mg by mouth every 12 (twelve) hours.   . pantoprazole (PROTONIX) 40 MG tablet Take 40 mg by mouth daily.  . polyethylene glycol (MIRALAX / GLYCOLAX) packet Take 17 g by mouth daily.  . ranitidine (ZANTAC) 150 MG tablet Take 150 mg by mouth at bedtime.  . rosuvastatin (CRESTOR) 20 MG tablet Take 20 mg by mouth daily.   No facility-administered encounter medications on file as of 03/05/2019.     ALLERGIES:  Allergies  Allergen Reactions  . Bupropion Other (See Comments)    Fidgety   . Clonidine Derivatives Other (See Comments)  . Codeine Nausea Only  . Diclofenac-Misoprostol Other (See Comments)    Fidgety   . Olanzapine Other (See Comments)    Fidgety   . Pregabalin Other (See Comments) and Rash  . Valdecoxib Other (See Comments)  . Zonisamide Other (See Comments)    Fidgety      PHYSICAL EXAM:  ECOG Performance status: 1  Vitals:   03/05/19 1300  BP: 93/67  Pulse: (!) 107  Resp: 16  Temp: 97.9 F (36.6 C)  SpO2: 95%   Filed Weights   03/05/19 1300  Weight: 196 lb 8 oz (89.1 kg)    Physical Exam Constitutional:      Appearance: Normal appearance. She is obese.  HENT:     Head: Normocephalic.     Nose: Nose normal.     Mouth/Throat:     Mouth: Mucous membranes are moist.     Pharynx: Oropharynx is clear.  Eyes:     Extraocular Movements: Extraocular movements intact.     Conjunctiva/sclera: Conjunctivae normal.  Neck:     Musculoskeletal: Normal range of motion.  Cardiovascular:     Rate and Rhythm: Normal rate and regular rhythm.     Pulses: Normal pulses.     Heart sounds: Normal heart sounds.  Pulmonary:     Effort: Pulmonary  effort is normal.     Breath sounds: Normal breath sounds.  Abdominal:     General: Bowel sounds are normal.     Palpations: Abdomen is soft.  Musculoskeletal: Normal range of motion.  Skin:    General: Skin is warm.  Neurological:     General: No focal deficit present.     Mental  Status: She is alert and oriented to person, place, and time.  Psychiatric:        Mood and Affect: Mood normal.        Behavior: Behavior normal.        Thought Content: Thought content normal.        Judgment: Judgment normal.      LABORATORY DATA:  I have reviewed the labs as listed.  CBC    Component Value Date/Time   WBC 72.2 (HH) 03/05/2019 1225   RBC 4.52 03/05/2019 1225   HGB 11.6 (L) 03/05/2019 1225   HCT 40.4 03/05/2019 1225   PLT 261 03/05/2019 1225   MCV 89.4 03/05/2019 1225   MCH 25.7 (L) 03/05/2019 1225   MCHC 28.7 (L) 03/05/2019 1225   RDW 15.3 03/05/2019 1225   LYMPHSABS 65.3 (H) 03/05/2019 1225   MONOABS 1.9 (H) 03/05/2019 1225   EOSABS 0.2 03/05/2019 1225   BASOSABS 0.2 (H) 03/05/2019 1225   CMP Latest Ref Rng & Units 03/05/2019 09/01/2018 04/30/2018  Glucose 70 - 99 mg/dL 119(H) 85 110(H)  BUN 8 - 23 mg/dL 11 11 11   Creatinine 0.44 - 1.00 mg/dL 1.22(H) 1.21(H) 1.20(H)  Sodium 135 - 145 mmol/L 141 139 139  Potassium 3.5 - 5.1 mmol/L 4.1 4.3 4.3  Chloride 98 - 111 mmol/L 108 105 104  CO2 22 - 32 mmol/L 25 28 28   Calcium 8.9 - 10.3 mg/dL 8.8(L) 9.1 9.0  Total Protein 6.5 - 8.1 g/dL 6.8 6.9 7.3  Total Bilirubin 0.3 - 1.2 mg/dL 0.6 0.3 0.6  Alkaline Phos 38 - 126 U/L 74 67 74  AST 15 - 41 U/L 22 25 25   ALT 0 - 44 U/L 14 18 18        ASSESSMENT & PLAN:   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 - Denies any recurrent infections.  Denies any B symptoms including fevers, night sweats or weight loss. -Physical  examination today reveals 3 lymph nodes, one in the left mid neck region, posterior cervical and supraclavicular.  No palpable lymphadenopathy elsewhere or splenomegaly. -She has no B symptoms.  Labs today reveal increasing white blood cell count at 72.2 absolute lymphocytes are 65.3.  Hemoglobin is 11.6, platelet counts are 261k.  -Have recommended CT chest abdomen pelvis to further assess lymphadenopathy and evaluate for splenomegaly. -She will return to clinic in 2 months.  -2.  Left breast cancer: -She had a history of left breast cancer in 1999, status post lumpectomy and radiation therapy followed by 4 years of tamoxifen. -She gets yearly mammograms in [place].  3.  CKD: -She has mild CKD.  I have advised her to avoid any nephrotoxic medication.      Orders placed this encounter:  Orders Placed This Encounter  Procedures  . CT Chest W Contrast  . CT Abdomen Pelvis [address] [place], [place] [phone]

## 2019-03-05 NOTE — Assessment & Plan Note (Signed)
1.  Stage I CLL: - Diagnosis by flow cytometry showing a clonal lymphocyte palpation of CD5 positive and CD23 positive with dim lambda light chain restriction. - Denies any recurrent infections.  Denies any B symptoms including fevers, night sweats or weight loss. -Physical examination today reveals 3 lymph nodes, one in the left mid neck region, posterior cervical and supraclavicular.  No palpable lymphadenopathy elsewhere or splenomegaly. -She has no B symptoms.  Labs today reveal increasing white blood cell count at 72.2 absolute lymphocytes are 65.3.  Hemoglobin is 11.6, platelet counts are 261k.  -Have recommended CT chest abdomen pelvis to further assess lymphadenopathy and evaluate for splenomegaly. -She will return to clinic in 2 months.  -2.  Left breast cancer: -She had a history of left breast cancer in 1999, status post lumpectomy and radiation therapy followed by 4 years of tamoxifen. -She gets yearly mammograms in [place].  3.  CKD: -She has mild CKD.  I have advised her to avoid any nephrotoxic medication.

## 2019-03-09 NOTE — Progress Notes (Signed)
CRITICAL VALUE ALERT  Critical Value:  WBC 72.2  Date & Time Notied:  72.2  Provider Notified: K. Milinda Hirschfeld, NP  Orders Received/Actions taken: n/a

## 2019-05-06 ENCOUNTER — Inpatient Hospital Stay (HOSPITAL_COMMUNITY): Payer: Medicare Other | Attending: Hematology

## 2019-05-06 ENCOUNTER — Other Ambulatory Visit: Payer: Self-pay

## 2019-05-06 ENCOUNTER — Ambulatory Visit (HOSPITAL_COMMUNITY)
Admission: RE | Admit: 2019-05-06 | Discharge: 2019-05-06 | Disposition: A | Discharge status: Home / Self Care | Payer: Medicare Other | Source: Ambulatory Visit | Attending: Hematology | Admitting: Hematology

## 2019-05-06 DIAGNOSIS — C9110 Chronic lymphocytic leukemia of B-cell type not having achieved remission: Secondary | ICD-10-CM | POA: Diagnosis present

## 2019-05-06 LAB — LACTATE DEHYDROGENASE: LDH: 180 U/L (ref 98–192)

## 2019-05-06 LAB — CBC WITH DIFFERENTIAL/PLATELET
Abs Immature Granulocytes: 0.14 10*3/uL — ABNORMAL HIGH (ref 0.00–0.07)
Basophils Absolute: 0.1 10*3/uL (ref 0.0–0.1)
Basophils Relative: 0 %
Eosinophils Absolute: 0.2 10*3/uL (ref 0.0–0.5)
Eosinophils Relative: 0 %
HCT: 40.8 % (ref 36.0–46.0)
Hemoglobin: 11.7 g/dL — ABNORMAL LOW (ref 12.0–15.0)
Immature Granulocytes: 0 %
Lymphocytes Relative: 92 %
Lymphs Abs: 73.7 10*3/uL — ABNORMAL HIGH (ref 0.7–4.0)
MCH: 25.5 pg — ABNORMAL LOW (ref 26.0–34.0)
MCHC: 28.7 g/dL — ABNORMAL LOW (ref 30.0–36.0)
MCV: 89.1 fL (ref 80.0–100.0)
Monocytes Absolute: 1.2 10*3/uL — ABNORMAL HIGH (ref 0.1–1.0)
Monocytes Relative: 2 %
Neutro Abs: 4.6 10*3/uL (ref 1.7–7.7)
Neutrophils Relative %: 6 %
Platelets: 259 10*3/uL (ref 150–400)
RBC: 4.58 MIL/uL (ref 3.87–5.11)
RDW: 15.5 % (ref 11.5–15.5)
WBC: 79.9 10*3/uL (ref 4.0–10.5)
nRBC: 0 % (ref 0.0–0.2)

## 2019-05-06 LAB — COMPREHENSIVE METABOLIC PANEL
ALT: 16 U/L (ref 0–44)
AST: 22 U/L (ref 15–41)
Albumin: 4.4 g/dL (ref 3.5–5.0)
Alkaline Phosphatase: 72 U/L (ref 38–126)
Anion gap: 8 (ref 5–15)
BUN: 10 mg/dL (ref 8–23)
CO2: 26 mmol/L (ref 22–32)
Calcium: 9.4 mg/dL (ref 8.9–10.3)
Chloride: 106 mmol/L (ref 98–111)
Creatinine, Ser: 1.2 mg/dL — ABNORMAL HIGH (ref 0.44–1.00)
GFR calc Af Amer: 53 mL/min — ABNORMAL LOW (ref >60)
GFR calc non Af Amer: 45 mL/min — ABNORMAL LOW (ref >60)
Glucose, Bld: 104 mg/dL — ABNORMAL HIGH (ref 70–99)
Potassium: 4.5 mmol/L (ref 3.5–5.1)
Sodium: 140 mmol/L (ref 135–145)
Total Bilirubin: 0.5 mg/dL (ref 0.3–1.2)
Total Protein: 7.5 g/dL (ref 6.5–8.1)

## 2019-05-06 MED ORDER — IOHEXOL 300 MG/ML  SOLN: 100.0000 mL | Freq: Once | INTRAMUSCULAR | Status: DC | PRN

## 2019-05-06 MED ORDER — IOHEXOL 300 MG/ML  SOLN
75.0000 mL | Freq: Once | INTRAMUSCULAR | Status: AC | PRN
  Administered 2019-05-06: 75 mL via INTRAVENOUS

## 2019-05-06 NOTE — Progress Notes (Signed)
CRITICAL VALUE ALERT  Critical Value:  WBC 79.9  Date & Time Notied:  05/06/2019 at 1413  Provider Notified: Cristela Felt, NP  Orders Received/Actions taken: n/a

## 2019-05-08 ENCOUNTER — Ambulatory Visit (HOSPITAL_COMMUNITY): Payer: Medicare Other | Admitting: Hematology

## 2019-05-13 ENCOUNTER — Encounter (HOSPITAL_COMMUNITY): Payer: Self-pay | Admitting: Hematology

## 2019-05-13 ENCOUNTER — Other Ambulatory Visit: Payer: Self-pay

## 2019-05-13 ENCOUNTER — Inpatient Hospital Stay (HOSPITAL_COMMUNITY): Payer: Medicare Other | Attending: Hematology | Admitting: Hematology

## 2019-05-13 ENCOUNTER — Inpatient Hospital Stay (HOSPITAL_COMMUNITY): Payer: Medicare Other

## 2019-05-13 VITALS — BP 111/78 | HR 99 | Temp 97.7°F | Resp 14 | Wt 192.4 lb

## 2019-05-13 DIAGNOSIS — Z853 Personal history of malignant neoplasm of breast: Secondary | ICD-10-CM | POA: Insufficient documentation

## 2019-05-13 DIAGNOSIS — C9110 Chronic lymphocytic leukemia of B-cell type not having achieved remission: Secondary | ICD-10-CM | POA: Insufficient documentation

## 2019-05-13 DIAGNOSIS — E079 Disorder of thyroid, unspecified: Secondary | ICD-10-CM | POA: Diagnosis not present

## 2019-05-13 DIAGNOSIS — I129 Hypertensive chronic kidney disease with stage 1 through stage 4 chronic kidney disease, or unspecified chronic kidney disease: Secondary | ICD-10-CM | POA: Insufficient documentation

## 2019-05-13 DIAGNOSIS — I739 Peripheral vascular disease, unspecified: Secondary | ICD-10-CM | POA: Diagnosis not present

## 2019-05-13 DIAGNOSIS — R161 Splenomegaly, not elsewhere classified: Secondary | ICD-10-CM | POA: Insufficient documentation

## 2019-05-13 DIAGNOSIS — K5909 Other constipation: Secondary | ICD-10-CM | POA: Insufficient documentation

## 2019-05-13 DIAGNOSIS — R5383 Other fatigue: Secondary | ICD-10-CM | POA: Insufficient documentation

## 2019-05-13 DIAGNOSIS — N183 Chronic kidney disease, stage 3 (moderate): Secondary | ICD-10-CM | POA: Insufficient documentation

## 2019-05-13 MED ORDER — SENNOSIDES-DOCUSATE SODIUM 8.6-50 MG PO TABS: 1.0000 | ORAL_TABLET | Freq: Two times a day (BID) | ORAL | 3 refills | Status: AC

## 2019-05-13 NOTE — Patient Instructions (Signed)
[facility] at [facility]  Discharge Instructions:  You saw Renee Nester, NP, today. _______________________________________________________________  Thank you for choosing [facility] at [facility] to provide your oncology and hematology care.  To afford each patient quality time with our providers, please arrive at least 15 minutes before your scheduled appointment.  You need to re-schedule your appointment if you arrive 10 or more minutes late.  We strive to give you quality time with our providers, and arriving late affects you and other patients whose appointments are after yours.  Also, if you no show three or more times for appointments you may be dismissed from the clinic.  Again, thank you for choosing [facility] at [facility]. Our hope is that these requests will allow you access to exceptional care and in a timely manner. _______________________________________________________________  If you have questions after your visit, please contact our office at [phone] between the hours of 8:30 a.m. and 5:00 p.m. Voicemails left after 4:30 p.m. will not be returned until the following business day. _______________________________________________________________  For prescription refill requests, have your pharmacy contact our office. _______________________________________________________________  Recommendations made by the consultant and any test results will be sent to your referring physician. _______________________________________________________________ 

## 2019-05-20 LAB — IGVH SOMATIC HYPERMUTATION

## 2019-05-22 LAB — FISH HES LEUKEMIA, 4Q12 REA

## 2019-05-26 ENCOUNTER — Other Ambulatory Visit (HOSPITAL_COMMUNITY): Payer: Self-pay | Admitting: Hematology

## 2019-05-26 MED ORDER — LACTULOSE 10 GM/15ML PO SOLN: 10.0000 g | Freq: Two times a day (BID) | ORAL | 0 refills | Status: DC | PRN

## 2019-05-26 NOTE — Assessment & Plan Note (Addendum)
1.  Stage I CLL: - Diagnosis by flow cytometry showing a clonal lymphocyte palpation of CD5 positive and CD23 positive with dim lambda light chain restriction. - Denies any recurrent infections.  Denies any B symptoms including fevers, night sweats or weight loss. -Physical examination today reveals 3 lymph nodes, one in the left mid neck region, posterior cervical and supraclavicular.   -She has no B symptoms.  Only intermittent day sweats. -Most recent labs reveal white blood cell count 79.9, absolute lymphocyte count of 73.7, hemoglobin 11.7, platelet count 259,000. -CT of chest abdomen and pelvis was performed on May 06, 2019 and reveals diffuse lymphadenopathy throughout the chest abdomen and pelvis with associated splenomegaly. - Today, have recommended to complete a fish leukemia panel as well as I GVH hyper mutation panel.  If these results indicate unfavorable prognosis, may consider starting patient on therapy with TKI. -Patient return to clinic in 1 month.   -2.  Left breast cancer: -She had a history of left breast cancer in 1999, status post lumpectomy and radiation therapy followed by 4 years of tamoxifen. -She gets yearly mammograms in [place].  3. Chronic Constipation  - Prescribed Senokot BID.   4.  CKD: -She has mild CKD.  I have advised her to avoid any nephrotoxic medication.

## 2019-05-26 NOTE — Progress Notes (Signed)
[place], [place] [address]   CLINIC:  Medical Oncology/Hematology  PCP:  Thea Alken [address] [place] [address] [phone]   REASON FOR VISIT:  Follow-up for CLL  CURRENT THERAPY: Clinical Surveillance     INTERVAL HISTORY:  Renee [name] 73 y.o. female presents today for follow up. She reports overall doing well. She continues with reports of moderate fatigue. She reports intermittent day sweats. Denies any significant night sweats. No chills or fevers. No weight loss. Cervical lymphadenopaty is still present. She denies any infections.  She continues with reports of chronic constipation. She takes over the counter stool softeners and laxatives with little relief of her symptoms.    REVIEW OF SYSTEMS:  Review of Systems  Constitutional: Positive for fatigue.  HENT:  Negative.   Eyes: Negative.   Respiratory: Negative.   Cardiovascular: Negative.   Gastrointestinal: Negative.   Endocrine: Negative.   Genitourinary: Negative.    Musculoskeletal: Positive for arthralgias.  Skin: Negative.   Neurological: Negative.   Hematological: Positive for adenopathy.  Psychiatric/Behavioral: Negative.      PAST MEDICAL/SURGICAL HISTORY:  Past Medical History:  Diagnosis Date  . Chronic renal disease, stage 3, moderately decreased glomerular filtration rate between 30-59 mL/min/1.73 square meter ([place]) 12/27/2017  . Constipation   . Depression   . History of breast cancer 12/27/2017  . Hypertension   . Leukemia ([place])    CLL  . PVD (peripheral vascular disease) ([place])   . Thyroid disease    Past Surgical History:  Procedure Laterality Date  . ABDOMINAL HYSTERECTOMY    . BREAST SURGERY     Left Breast Lumpectomy  . COLON SURGERY    . HERNIA REPAIR  2000   Hiatal hernia repair       SOCIAL HISTORY:  Social History   Socioeconomic History  . Marital status: Unknown    Spouse name: Not on file  . Number of  children: Not on file  . Years of education: Not on file  . Highest education level: Not on file  Occupational History  . Not on file  Social Needs  . Financial resource strain: Not on file  . Food insecurity    Worry: Not on file    Inability: Not on file  . Transportation needs    Medical: Not on file    Non-medical: Not on file  Tobacco Use  . Smoking status: Former Smoker    Packs/day: 0.75    Types: Cigarettes    Start date: 09/19/1962    Quit date: 09/20/1979    Years since quitting: 39.7  . Smokeless tobacco: Never Used  Substance and Sexual Activity  . Alcohol use: No    Alcohol/week: 0.0 standard drinks  . Drug use: No  . Sexual activity: Never  Lifestyle  . Physical activity    Days per week: Not on file    Minutes per session: Not on file  . Stress: Not on file  Relationships  . Social Herbalist on phone: Not on file    Gets together: Not on file    Attends religious service: Not on file    Active member of club or organization: Not on file    Attends meetings of clubs or organizations: Not on file    Relationship status: Not on file  . Intimate partner violence    Fear of current or ex partner: Not on file    Emotionally  abused: Not on file    Physically abused: Not on file    Forced sexual activity: Not on file  Other Topics Concern  . Not on file  Social History Narrative  . Not on file    FAMILY HISTORY:  History reviewed. No pertinent family history.  CURRENT MEDICATIONS:  Outpatient Encounter Medications as of 05/13/2019  Medication Sig  . methadone (DOLOPHINE) 10 MG tablet 1.5 tabs ( 15 mg ) bid and 1 tab at HS, 4 per day  . psyllium (METAMUCIL) 58.6 % packet Take 1 packet by mouth daily.  . Calcium Carbonate-Vitamin D (CALCIUM HIGH POTENCY/VITAMIN D) 600-200 MG-UNIT TABS Take by mouth.  . clonazePAM (KLONOPIN) 0.5 MG tablet Take 0.5 mg by mouth 2 (two) times daily.   . cloNIDine (CATAPRES) 0.1 MG tablet Take 0.1 mg by mouth daily.    . cloNIDine-chlorthalidone (CLORPRES) 0.2-15 MG tablet Take 1 tablet by mouth daily.  [name] Kitchen conjugated estrogens (PREMARIN) vaginal cream Place 1 Applicatorful vaginally 2 (two) times a week.  . DULoxetine (CYMBALTA) 60 MG capsule Take 60 mg by mouth daily.  [name] Kitchen esomeprazole (NEXIUM) 40 MG capsule Take by mouth.  . IBU 600 MG tablet Take 600 mg by mouth every 6 (six) hours as needed. for pain  . KRILL OIL PO Take 900 mg by mouth daily.  [name] Kitchen levothyroxine (SYNTHROID, LEVOTHROID) 100 MCG tablet Take 100 mcg by mouth daily before breakfast.  . lurasidone (LATUDA) 40 MG TABS tablet Take 40 mg by mouth daily with breakfast.  . pantoprazole (PROTONIX) 40 MG tablet Take 40 mg by mouth daily.  . polyethylene glycol (MIRALAX / GLYCOLAX) packet Take 17 g by mouth daily.  . ranitidine (ZANTAC) 150 MG tablet Take 150 mg by mouth at bedtime.  . rosuvastatin (CRESTOR) 20 MG tablet Take 20 mg by mouth daily.  [name] Kitchen senna-docusate (SENOKOT-S) 8.6-50 MG tablet Take 1 tablet by mouth 2 (two) times daily.  . [DISCONTINUED] clonazePAM (KLONOPIN) 0.5 MG tablet Take by mouth.  . [DISCONTINUED] methadone (DOLOPHINE) 10 MG tablet Take 15 mg by mouth every 12 (twelve) hours.    No facility-administered encounter medications on file as of 05/13/2019.     ALLERGIES:  Allergies  Allergen Reactions  . Bupropion Other (See Comments)    Fidgety   . Clonidine Derivatives Other (See Comments)  . Codeine Nausea Only  . Diclofenac-Misoprostol Other (See Comments)    Fidgety   . Olanzapine Other (See Comments)    Fidgety   . Pregabalin Other (See Comments) and Rash  . Valdecoxib Other (See Comments)  . Zonisamide Other (See Comments)    Fidgety      PHYSICAL EXAM:  ECOG Performance status: 1  Vitals:   05/13/19 1201  BP: 111/78  Pulse: 99  Resp: 14  Temp: 97.7 F (36.5 C)  SpO2: 98%   Filed Weights   05/13/19 1201  Weight: 192 lb 6.4 oz (87.3 kg)    Physical Exam Constitutional:      Appearance: Normal  appearance. She is obese.  HENT:     Head: Normocephalic.     Right Ear: External ear normal.     Left Ear: External ear normal.     Nose: Nose normal.     Mouth/Throat:     Mouth: Mucous membranes are moist.     Pharynx: Oropharynx is clear.  Eyes:     Conjunctiva/sclera: Conjunctivae normal.  Cardiovascular:     Rate and Rhythm: Normal rate and regular rhythm.  Pulses: Normal pulses.     Heart sounds: Normal heart sounds.  Pulmonary:     Effort: Pulmonary effort is normal.     Breath sounds: Normal breath sounds.  Abdominal:     General: Bowel sounds are normal.  Musculoskeletal: Normal range of motion.  Lymphadenopathy:     Cervical: Cervical adenopathy present.  Skin:    General: Skin is warm.  Neurological:     General: No focal deficit present.     Mental Status: She is alert and oriented to person, place, and time.  Psychiatric:        Mood and Affect: Mood normal.        Behavior: Behavior normal.        Thought Content: Thought content normal.        Judgment: Judgment normal.      LABORATORY DATA:  I have reviewed the labs as listed.  CBC    Component Value Date/Time   WBC 79.9 (HH) 05/06/2019 1338   RBC 4.58 05/06/2019 1338   HGB 11.7 (L) 05/06/2019 1338   HCT 40.8 05/06/2019 1338   PLT 259 05/06/2019 1338   MCV 89.1 05/06/2019 1338   MCH 25.5 (L) 05/06/2019 1338   MCHC 28.7 (L) 05/06/2019 1338   RDW 15.5 05/06/2019 1338   LYMPHSABS 73.7 (H) 05/06/2019 1338   MONOABS 1.2 (H) 05/06/2019 1338   EOSABS 0.2 05/06/2019 1338   BASOSABS 0.1 05/06/2019 1338   CMP Latest Ref Rng & Units 05/06/2019 03/05/2019 09/01/2018  Glucose 70 - 99 mg/dL 104(H) 119(H) 85  BUN 8 - 23 mg/dL 10 11 11   Creatinine 0.44 - 1.00 mg/dL 1.20(H) 1.22(H) 1.21(H)  Sodium 135 - 145 mmol/L 140 141 139  Potassium 3.5 - 5.1 mmol/L 4.5 4.1 4.3  Chloride 98 - 111 mmol/L 106 108 105  CO2 22 - 32 mmol/L 26 25 28   Calcium 8.9 - 10.3 mg/dL 9.4 8.8(L) 9.1  Total Protein 6.5 - 8.1 g/dL  7.5 6.8 6.9  Total Bilirubin 0.3 - 1.2 mg/dL 0.5 0.6 0.3  Alkaline Phos 38 - 126 U/L 72 74 67  AST 15 - 41 U/L 22 22 25   ALT 0 - 44 U/L 16 14 18         ASSESSMENT & PLAN:   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 - Denies any recurrent infections.  Denies any B symptoms including fevers, night sweats or weight loss. -Physical examination today reveals 3 lymph nodes, one in the left mid neck region, posterior cervical and supraclavicular.   -She has no B symptoms.  Only intermittent day sweats. -Most recent labs reveal white blood cell count 79.9, absolute lymphocyte count of 73.7, hemoglobin 11.7, platelet count 259,000. -CT of chest abdomen and pelvis was performed on May 06, 2019 and reveals diffuse lymphadenopathy throughout the chest abdomen and pelvis with associated splenomegaly. - Today, have recommended to complete a fish leukemia panel as well as I GVH hyper mutation panel.  If these results indicate unfavorable prognosis, may consider starting patient on therapy with TKI. -Patient return to clinic in 1 month.   -2.  Left breast cancer: -She had a history of left breast cancer in 1999, status post lumpectomy and radiation therapy followed by 4 years of tamoxifen. -She gets yearly mammograms in [place].  3. Chronic Constipation  - Prescribed Senokot BID.  4.  CKD: -She has mild CKD.  I have advised her to avoid any nephrotoxic medication.      Orders placed this encounter:  Orders Placed This Encounter  Procedures  . FISH HES Leukemia,4q12 REA  . [facility] [place], [place] [phone]

## 2019-06-11 ENCOUNTER — Other Ambulatory Visit (HOSPITAL_COMMUNITY): Payer: Medicare Other

## 2019-06-11 ENCOUNTER — Other Ambulatory Visit (HOSPITAL_COMMUNITY): Payer: Self-pay | Admitting: Hematology

## 2019-06-12 ENCOUNTER — Other Ambulatory Visit (HOSPITAL_COMMUNITY): Payer: Self-pay

## 2019-06-12 MED ORDER — LACTULOSE 10 GM/15ML PO SOLN: 10.0000 g | Freq: Two times a day (BID) | ORAL | 0 refills | Status: DC | PRN

## 2019-06-18 ENCOUNTER — Other Ambulatory Visit (HOSPITAL_COMMUNITY): Payer: Medicare Other

## 2019-06-18 ENCOUNTER — Ambulatory Visit (HOSPITAL_COMMUNITY): Payer: Medicare Other | Admitting: Hematology

## 2019-07-07 NOTE — Progress Notes (Addendum)
Subjective:    Patient ID: Renee [name], female    DOB: 1945/12/30, 73 y.o.   MRN: [id number]  HPI Renee [name] is a 73 year old female with a past medical history of depression, GERD, breast cancer 1999 s/p left breast lumpectomy with radiation, no chemotherapy, CLL diagnosed 2018 "not yet in remission", chronic rectal pain and pudendal neuralgia. Past total hysterectomy, right inguinal hernia repair and Nissen Fundoplication. She presents today with complaints of having constipation. She is taking Miralax daily for 10+ years. A few months ago she developed difficulty passing the stool out. She most likely has pelvic floor dysfunction. She was prescribed Lactulose bid and Senna 1 bid for the past 6 weeks. She is passing a mushy stool 1 to 2 times daily, BMs are mushy, takes a lot of wiping. No rectal bleeding or melena. She underwent a colonoscopy 4 years by a GI in [place] which she reported was normal. Her first colonoscopy around the age of 42 she reported was normal. No upper or lower abdominal pain. She reports having indigestion most days which occurs after she eats which has persisted for the past few years. No dysphagia. Sometimes pills get stuck in her throat. She is not sure if she has regurgitation of acid or regurgitation of food. She take Pantoprazole 40mg  once daily and occasional TUMS. Some sweats. She has lost 3lbs over the past 2 months.   Family history: Mother died age 106 from old age. Father died at the age of 28 from old age, history of Parkinson's and colon cancer.   Social History : Widowed. Son and daughter are healthy. No alcohol. Past smoker, quit in 1981.   Current Outpatient Medications on File Prior to Visit  Medication Sig Dispense Refill  . Calcium Carbonate-Vitamin D (CALCIUM HIGH POTENCY/VITAMIN D) 600-200 MG-UNIT TABS Take by mouth daily.     . clonazePAM (KLONOPIN) 0.5 MG tablet Take 0.5 mg by mouth 2 (two) times daily.     . DULoxetine (CYMBALTA) 60 MG  capsule Take 60 mg by mouth daily.    [name] Kitchen ENULOSE 10 GM/15ML SOLN TAKE 15 MLS (10 G TOTAL) BY MOUTH TWO (2) (TWO) TIMES DAILY AS NEEDED FOR MILD CONSTIPATION OR SEVERE CONSTIPATION.  236 mL 0  . KRILL OIL PO Take 900 mg by mouth daily.    [name] Kitchen lactulose (CHRONULAC) 10 GM/15ML solution Take 15 mLs (10 g total) by mouth 2 (two) times daily as needed for mild constipation or severe constipation. 236 mL 0  . levothyroxine (SYNTHROID, LEVOTHROID) 100 MCG tablet Take 100 mcg by mouth daily before breakfast.    . lurasidone (LATUDA) 40 MG TABS tablet Take 40 mg by mouth daily with breakfast.    . methadone (DOLOPHINE) 10 MG tablet 1.5 tabs ( 15 mg ) bid and 1 tab at HS, 4 per day    . pantoprazole (PROTONIX) 40 MG tablet Take 40 mg by mouth daily.  4  . polyethylene glycol (MIRALAX / GLYCOLAX) packet Take 17 g by mouth daily.    . rosuvastatin (CRESTOR) 20 MG tablet Take 20 mg by mouth daily.    [name] Kitchen senna-docusate (SENOKOT-S) 8.6-50 MG tablet Take 1 tablet by mouth 2 (two) times daily. 60 tablet 3  . [DISCONTINUED] lactulose (CHRONULAC) 10 GM/15ML solution Take 15 mLs (10 g total) by mouth 2 (two) times daily as needed for mild constipation or severe constipation. 236 mL 0   No current facility-administered medications on file prior to visit.  Allergies  Allergen Reactions  . Bupropion Other (See Comments)    Fidgety   . Clonidine Derivatives Other (See Comments)  . Codeine Nausea Only  . Diclofenac-Misoprostol Other (See Comments)    Fidgety   . Olanzapine Other (See Comments)    Fidgety   . Pregabalin Other (See Comments) and Rash  . Valdecoxib Other (See Comments)  . Zonisamide Other (See Comments)    Fidgety     Review of Systems see HPI, all other systems reviewed and are negative     Objective:   Physical Exam  BP 121/75   Pulse 98   Temp 98.6 F (37 C) (Oral)   Ht 6' (1.829 m)   Wt 190 lb 1.6 oz (86.2 kg)   BMI 25.55 kg/m  : 73 year old female no acute distress Eyes: Sclera  nonicteric, conjunctiva pink Neck: No left cervical adenopathy noted Mouth: Upper dental implants with mild surrounding erythema Heart: Regular rate and rhythm, no murmurs Lungs: Breath sounds clear throughout Abdomen: Soft, nontender, no masses or organomegaly, radiation marker noted to the lower chest area, midline abdominal scar well-healed Extremities: No edema, ankles are thin and bony Neuro: Alert and oriented x4, no focal deficits     Assessment & Plan:   78.  72 year old female with constipation, most likely has pelvic floor dysfunction with  history of chronic pudendal neuralgia surgery. -Trial with Linzess 72 mcg 1 tab to be taken 30 minutes before breakfast -She wishes to avoid any invasive endoscopic evaluation -Follow-up in the office with Dr. Dorien [name] in 3 months  2.  GERD -Protonix changed to 20 mg p.o. twice daily, I hesitate to increase the dose of the PPI due to renal insufficiency with a creatinine level of 1.22 -She will call me in 2 weeks with an update  3.  CLL  4.  History of breast cancer

## 2019-07-08 ENCOUNTER — Encounter (INDEPENDENT_AMBULATORY_CARE_PROVIDER_SITE_OTHER): Payer: Self-pay | Admitting: Nurse Practitioner

## 2019-07-08 ENCOUNTER — Ambulatory Visit (INDEPENDENT_AMBULATORY_CARE_PROVIDER_SITE_OTHER): Payer: Medicare Other | Admitting: Nurse Practitioner

## 2019-07-08 ENCOUNTER — Other Ambulatory Visit: Payer: Self-pay

## 2019-07-08 DIAGNOSIS — K5900 Constipation, unspecified: Secondary | ICD-10-CM | POA: Diagnosis not present

## 2019-07-08 DIAGNOSIS — K219 Gastro-esophageal reflux disease without esophagitis: Secondary | ICD-10-CM | POA: Diagnosis not present

## 2019-07-08 MED ORDER — LINACLOTIDE 72 MCG PO CAPS: 72.0000 ug | ORAL_CAPSULE | Freq: Every day | ORAL | 1 refills | Status: DC

## 2019-07-08 MED ORDER — PANTOPRAZOLE SODIUM 20 MG PO TBEC: 20.0000 mg | DELAYED_RELEASE_TABLET | Freq: Two times a day (BID) | ORAL | 1 refills | Status: DC

## 2019-07-08 NOTE — Patient Instructions (Addendum)
1.  Increase pantoprazole 20 mg 1 capsule twice daily for 2 weeks.  Call [place] in 2 weeks with an update.  2.  Trial with Linzess 72 mcg 1 tab to be taken 30 minutes before breakfast, samples provided and prescription sent to your pharmacy  3.  We discussed scheduling an EGD and barium swallow if your symptoms persist  4.  Follow-up in the office with Dr. Dorien Chihuahua in 3 months.   Call our office if your symptoms worsen prior to your follow-up appointment.

## 2019-07-09 ENCOUNTER — Inpatient Hospital Stay (HOSPITAL_COMMUNITY): Payer: Medicare Other | Attending: Hematology

## 2019-07-09 ENCOUNTER — Inpatient Hospital Stay (HOSPITAL_COMMUNITY): Payer: Medicare Other | Admitting: Hematology

## 2019-07-09 DIAGNOSIS — C9110 Chronic lymphocytic leukemia of B-cell type not having achieved remission: Secondary | ICD-10-CM

## 2019-07-09 DIAGNOSIS — K5909 Other constipation: Secondary | ICD-10-CM | POA: Diagnosis not present

## 2019-07-09 DIAGNOSIS — N189 Chronic kidney disease, unspecified: Secondary | ICD-10-CM | POA: Insufficient documentation

## 2019-07-09 LAB — COMPREHENSIVE METABOLIC PANEL
ALT: 16 U/L (ref 0–44)
AST: 24 U/L (ref 15–41)
Albumin: 4.4 g/dL (ref 3.5–5.0)
Alkaline Phosphatase: 80 U/L (ref 38–126)
Anion gap: 10 (ref 5–15)
BUN: 12 mg/dL (ref 8–23)
CO2: 24 mmol/L (ref 22–32)
Calcium: 9.1 mg/dL (ref 8.9–10.3)
Chloride: 105 mmol/L (ref 98–111)
Creatinine, Ser: 1.15 mg/dL — ABNORMAL HIGH (ref 0.44–1.00)
GFR calc Af Amer: 55 mL/min — ABNORMAL LOW (ref >60)
GFR calc non Af Amer: 48 mL/min — ABNORMAL LOW (ref >60)
Glucose, Bld: 110 mg/dL — ABNORMAL HIGH (ref 70–99)
Potassium: 4.4 mmol/L (ref 3.5–5.1)
Sodium: 139 mmol/L (ref 135–145)
Total Bilirubin: 0.7 mg/dL (ref 0.3–1.2)
Total Protein: 7.3 g/dL (ref 6.5–8.1)

## 2019-07-09 LAB — CBC WITH DIFFERENTIAL/PLATELET
Abs Immature Granulocytes: 0.17 10*3/uL — ABNORMAL HIGH (ref 0.00–0.07)
Basophils Absolute: 0.1 10*3/uL (ref 0.0–0.1)
Basophils Relative: 0 %
Eosinophils Absolute: 0.2 10*3/uL (ref 0.0–0.5)
Eosinophils Relative: 0 %
HCT: 40.9 % (ref 36.0–46.0)
Hemoglobin: 11.8 g/dL — ABNORMAL LOW (ref 12.0–15.0)
Immature Granulocytes: 0 %
Lymphocytes Relative: 92 %
Lymphs Abs: 80.2 10*3/uL — ABNORMAL HIGH (ref 0.7–4.0)
MCH: 25.7 pg — ABNORMAL LOW (ref 26.0–34.0)
MCHC: 28.9 g/dL — ABNORMAL LOW (ref 30.0–36.0)
MCV: 89.1 fL (ref 80.0–100.0)
Monocytes Absolute: 1.3 10*3/uL — ABNORMAL HIGH (ref 0.1–1.0)
Monocytes Relative: 2 %
Neutro Abs: 4.9 10*3/uL (ref 1.7–7.7)
Neutrophils Relative %: 6 %
Platelets: 297 10*3/uL (ref 150–400)
RBC: 4.59 MIL/uL (ref 3.87–5.11)
RDW: 15.4 % (ref 11.5–15.5)
WBC: 86.8 10*3/uL (ref 4.0–10.5)
nRBC: 0 % (ref 0.0–0.2)

## 2019-07-09 NOTE — Progress Notes (Signed)
CRITICAL VALUE ALERT  Critical Value:  WBC 86.8  Date & Time Notied:  07/09/2019 at 1317  Provider Notified: R. Milinda Hirschfeld, NP  Orders Received/Actions taken: n/a

## 2019-07-10 NOTE — Assessment & Plan Note (Signed)
1.  Stage I CLL: - Diagnosis by flow cytometry showing a clonal lymphocyte palpation of CD5 positive and CD23 positive with dim lambda light chain restriction. - Denies any recurrent infections.  Denies any B symptoms including fevers, night sweats or weight loss. -Physical examination today reveals 3 lymph nodes, one in the left mid neck region, posterior cervical and supraclavicular.   -She has no B symptoms.  Only intermittent day sweats. -Most recent labs reveal white blood cell count 79.9, absolute lymphocyte count of 73.7, hemoglobin 11.7, platelet count 259,000. -CT of chest abdomen and pelvis was performed on May 06, 2019 and reveals diffuse lymphadenopathy throughout the chest abdomen and pelvis with associated splenomegaly. 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  Patient has no high risk mutations.  At this time, recommend patient continue clinical surveillance.  Treatment will be indicated if patient develops any fever, chills, night sweats or weight loss, hemoglobin less than 10, or platelets less than 100,000.   -2.  Left breast cancer: -She had a history of left breast cancer in 1999, status post lumpectomy and radiation therapy followed by 4 years of tamoxifen. -She gets yearly mammograms in [place].  3. Chronic Constipation  -Currently on Linzess.  4.  CKD: -She has mild CKD.  I have advised her to avoid any nephrotoxic medication.

## 2019-07-10 NOTE — Progress Notes (Signed)
[address], [place] [address]   CLINIC:  Medical Oncology/Hematology  PCP:  Thea Alken [address] [place] [address] [phone]   REASON FOR VISIT:  Follow-up for  CLL   CURRENT THERAPY: Clinical surveillance    INTERVAL HISTORY:  [name] 73 y.o. female presents today for follow-up.  Reports overall doing well.  She denies any significant fatigue.  She denies any fevers, chills, night sweats.  No weight loss.  Patient does have day sweats.  She states the sweating during the day has been occurring for the last 6 months.  Patient does have cervical lymphadenopathy.  Patient states she is now on Linzess for her chronic constipation and that is working well for her.  She is here for repeat labs and office visit.   REVIEW OF SYSTEMS:  Review of Systems  Constitutional: Positive for fatigue.  HENT:  Negative.   Eyes: Negative.   Respiratory: Negative.   Cardiovascular: Negative.   Gastrointestinal: Positive for constipation.  Endocrine: Negative.   Genitourinary: Negative.    Musculoskeletal: Positive for arthralgias.  Skin: Negative.   Neurological: Positive for extremity weakness.  Hematological: Negative.   Psychiatric/Behavioral: Negative.      PAST MEDICAL/SURGICAL HISTORY:  Past Medical History:  Diagnosis Date  . Chronic renal disease, stage 3, moderately decreased glomerular filtration rate between 30-59 mL/min/1.73 square meter 12/27/2017  . Constipation   . Depression   . History of breast cancer 12/27/2017  . Hypertension   . Leukemia ([place])    CLL  . PVD (peripheral vascular disease) ([place])   . Thyroid disease    Past Surgical History:  Procedure Laterality Date  . ABDOMINAL HYSTERECTOMY    . BREAST SURGERY     Left Breast Lumpectomy  . COLON SURGERY    . HERNIA REPAIR  2000   Hiatal hernia repair       SOCIAL HISTORY:  Social History   Socioeconomic History  . Marital status: Unknown   Spouse name: Not on file  . Number of children: Not on file  . Years of education: Not on file  . Highest education level: Not on file  Occupational History  . Not on file  Social Needs  . Financial resource strain: Not on file  . Food insecurity    Worry: Not on file    Inability: Not on file  . Transportation needs    Medical: Not on file    Non-medical: Not on file  Tobacco Use  . Smoking status: Former Smoker    Packs/day: 0.75    Types: Cigarettes    Start date: 09/19/1962    Quit date: 09/20/1979    Years since quitting: 39.8  . Smokeless tobacco: Never Used  Substance and Sexual Activity  . Alcohol use: No    Alcohol/week: 0.0 standard drinks  . Drug use: No  . Sexual activity: Never  Lifestyle  . Physical activity    Days per week: Not on file    Minutes per session: Not on file  . Stress: Not on file  Relationships  . Social Herbalist on phone: Not on file    Gets together: Not on file    Attends religious service: Not on file    Active member of club or organization: Not on file    Attends meetings of clubs or organizations: Not on file    Relationship status: Not on file  . Intimate partner  violence    Fear of current or ex partner: Not on file    Emotionally abused: Not on file    Physically abused: Not on file    Forced sexual activity: Not on file  Other Topics Concern  . Not on file  Social History Narrative  . Not on file    FAMILY HISTORY:  No family history on file.  CURRENT MEDICATIONS:  Outpatient Encounter Medications as of 07/09/2019  Medication Sig Note  . Calcium Carbonate-Vitamin D (CALCIUM HIGH POTENCY/VITAMIN D) 600-200 MG-UNIT TABS Take by mouth daily.  07/08/2019: Patient is not currently on this medication.  . clonazePAM (KLONOPIN) 0.5 MG tablet Take 0.5 mg by mouth 2 (two) times daily.    . DULoxetine (CYMBALTA) 60 MG capsule Take 60 mg by mouth daily.   [name] Kitchen KRILL OIL PO Take 900 mg by mouth daily.   [name] Kitchen levothyroxine  (SYNTHROID, LEVOTHROID) 100 MCG tablet Take 100 mcg by mouth daily before breakfast.   . linaclotide (LINZESS) 72 MCG capsule Take 1 capsule (72 mcg total) by mouth daily before breakfast.   . lurasidone (LATUDA) 40 MG TABS tablet Take 40 mg by mouth daily with breakfast.   . methadone (DOLOPHINE) 10 MG tablet 1.5 tabs ( 15 mg ) bid and 1 tab at HS, 4 per day   . morphine (MSIR) 15 MG tablet Take 15 mg by mouth 4 (four) times daily as needed.   . pantoprazole (PROTONIX) 20 MG tablet Take 1 tablet (20 mg total) by mouth 2 (two) times daily.   . rosuvastatin (CRESTOR) 20 MG tablet Take 20 mg by mouth daily.   [name] Kitchen lactulose (CHRONULAC) 10 GM/15ML solution Take 15 mLs (10 g total) by mouth 2 (two) times daily as needed for mild constipation or severe constipation. (Patient not taking: Reported on 07/09/2019)   . polyethylene glycol (MIRALAX / GLYCOLAX) packet Take 17 g by mouth daily.   [name] Kitchen senna-docusate (SENOKOT-S) 8.6-50 MG tablet Take 1 tablet by mouth 2 (two) times daily. (Patient not taking: Reported on 07/09/2019)   . [DISCONTINUED] ENULOSE 10 GM/15ML SOLN TAKE 15 MLS (10 G TOTAL) BY MOUTH TWO (2) (TWO) TIMES DAILY AS NEEDED FOR MILD CONSTIPATION OR SEVERE CONSTIPATION.  (Patient not taking: Reported on 07/09/2019)   . [DISCONTINUED] lactulose (CHRONULAC) 10 GM/15ML solution Take 15 mLs (10 g total) by mouth 2 (two) times daily as needed for mild constipation or severe constipation.    No facility-administered encounter medications on file as of 07/09/2019.     ALLERGIES:  Allergies  Allergen Reactions  . Bupropion Other (See Comments)    Fidgety   . Clonidine Derivatives Other (See Comments)  . Codeine Nausea Only  . Diclofenac-Misoprostol Other (See Comments)    Fidgety   . Olanzapine Other (See Comments)    Fidgety   . Pregabalin Other (See Comments) and Rash  . Valdecoxib Other (See Comments)  . Zonisamide Other (See Comments)    Fidgety      PHYSICAL EXAM:  ECOG Performance status:  1  Vitals:   07/09/19 1321  BP: 108/68  Pulse: 95  Resp: 18  Temp: 97.9 F (36.6 C)  SpO2: 95%   Filed Weights   07/09/19 1321  Weight: 189 lb 11.2 oz (86 kg)    Physical Exam Constitutional:      Appearance: Normal appearance.  HENT:     Head: Normocephalic.     Right Ear: External ear normal.     Left Ear: External ear normal.  Nose: Nose normal.     Mouth/Throat:     Pharynx: Oropharynx is clear.  Eyes:     Conjunctiva/sclera: Conjunctivae normal.  Neck:     Musculoskeletal: Normal range of motion.  Cardiovascular:     Rate and Rhythm: Normal rate and regular rhythm.     Pulses: Normal pulses.     Heart sounds: Normal heart sounds.  Pulmonary:     Effort: Pulmonary effort is normal.     Breath sounds: Normal breath sounds.  Abdominal:     General: Bowel sounds are normal.  Musculoskeletal: Normal range of motion.  Skin:    General: Skin is warm.  Neurological:     General: No focal deficit present.     Mental Status: She is alert and oriented to person, place, and time. Mental status is at baseline.  Psychiatric:        Mood and Affect: Mood normal.        Behavior: Behavior normal.        Thought Content: Thought content normal.        Judgment: Judgment normal.      LABORATORY DATA:  I have reviewed the labs as listed.  CBC    Component Value Date/Time   WBC 86.8 (HH) 07/09/2019 1250   RBC 4.59 07/09/2019 1250   HGB 11.8 (L) 07/09/2019 1250   HCT 40.9 07/09/2019 1250   PLT 297 07/09/2019 1250   MCV 89.1 07/09/2019 1250   MCH 25.7 (L) 07/09/2019 1250   MCHC 28.9 (L) 07/09/2019 1250   RDW 15.4 07/09/2019 1250   LYMPHSABS 80.2 (H) 07/09/2019 1250   MONOABS 1.3 (H) 07/09/2019 1250   EOSABS 0.2 07/09/2019 1250   BASOSABS 0.1 07/09/2019 1250   CMP Latest Ref Rng & Units 07/09/2019 05/06/2019 03/05/2019  Glucose 70 - 99 mg/dL 110(H) 104(H) 119(H)  BUN 8 - 23 mg/dL '12 10 11  '$ Creatinine 0.44 - 1.00 mg/dL 1.15(H) 1.20(H) 1.22(H)  Sodium 135 - 145  mmol/L 139 140 141  Potassium 3.5 - 5.1 mmol/L 4.4 4.5 4.1  Chloride 98 - 111 mmol/L 105 106 108  CO2 22 - 32 mmol/L '24 26 25  '$ Calcium 8.9 - 10.3 mg/dL 9.1 9.4 8.8(L)  Total Protein 6.5 - 8.1 g/dL 7.3 7.5 6.8  Total Bilirubin 0.3 - 1.2 mg/dL 0.7 0.5 0.6  Alkaline Phos 38 - 126 U/L 80 72 74  AST 15 - 41 U/L '24 22 22  '$ ALT 0 - 44 U/L '16 16 14       '$ ASSESSMENT & PLAN:   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 - Denies any recurrent infections.  Denies any B symptoms including fevers, night sweats or weight loss. -Physical examination today reveals 3 lymph nodes, one in the left mid neck region, posterior cervical and supraclavicular.   -She has no B symptoms.  Only intermittent day sweats. -Most recent labs reveal white blood cell count 79.9, absolute lymphocyte count of 73.7, hemoglobin 11.7, platelet count 259,000. -CT of chest abdomen and pelvis was performed on May 06, 2019 and reveals diffuse lymphadenopathy throughout the chest abdomen and pelvis with associated splenomegaly. 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  Patient has no high risk mutations.  At this time, recommend patient continue clinical surveillance.  Treatment will be indicated if patient develops any fever,  chills, night sweats or weight loss, hemoglobin less than 10, or platelets less than 100,000.   -2.  Left breast cancer: -She had a history of left breast cancer in 1999, status post lumpectomy and radiation therapy followed by 4 years of tamoxifen. -She gets yearly mammograms in [place].  3. Chronic Constipation  -Currently on Linzess.  4.  CKD: -She has mild CKD.  I have advised her to avoid any nephrotoxic medication.      Orders placed this encounter:  Orders Placed This Encounter  Procedures  . CBC with  Differential  . Comprehensive metabolic panel  . Lactate dehydrogenase      [facility] [phone]

## 2019-07-16 ENCOUNTER — Telehealth (INDEPENDENT_AMBULATORY_CARE_PROVIDER_SITE_OTHER): Payer: Self-pay | Admitting: Nurse Practitioner

## 2019-07-16 NOTE — Telephone Encounter (Signed)
I called pt, left a msg for her to call me back

## 2019-07-16 NOTE — Telephone Encounter (Signed)
Patient would like a call back regarding the [place] she was put on  -  Ph# [phone]

## 2019-07-22 NOTE — Telephone Encounter (Signed)
Patient called back regarding her medication - ph# [phone]

## 2019-07-22 NOTE — Telephone Encounter (Signed)
I called the patient back, Linzess 72 mcg tablet was not efficient so she took lactulose, MiraLAX and Duca locks which resulted in a blowout on Monday.  She is willing to try Linzess 145 mcg once daily.  Will utilize her Linzess supplies 72 mcg tablets by taking 2 of these tablets in the morning 30 minutes before breakfast.  If this regimen is tolerated, she will call me in 1 week and I will send a prescription for Linzess 145 mcg 1 tablet p.o. daily to her pharmacy.  As she is adjusting to the higher dose of Linzess she may use MiraLAX, lactulose or Dulcolax as needed.  She verbalized understanding these instructions.

## 2019-07-29 ENCOUNTER — Telehealth (INDEPENDENT_AMBULATORY_CARE_PROVIDER_SITE_OTHER): Payer: Self-pay | Admitting: Nurse Practitioner

## 2019-07-29 ENCOUNTER — Other Ambulatory Visit (INDEPENDENT_AMBULATORY_CARE_PROVIDER_SITE_OTHER): Payer: Self-pay | Admitting: Nurse Practitioner

## 2019-07-29 MED ORDER — LUBIPROSTONE 8 MCG PO CAPS: 8.0000 ug | ORAL_CAPSULE | Freq: Two times a day (BID) | ORAL | 0 refills | Status: DC

## 2019-07-29 NOTE — Telephone Encounter (Signed)
I called the patient her constipation has not improved on Linzess 145 mcg once daily dosing in addition to lactulose.  She does not wish to try the higher dose of Linzess.  She elects to try Amitiza, I will start her at Amitiza 8 mcg 1 tab p.o. twice daily with food, if no improvement I will prescribe the higher dose.  Prescription for Amitiza was sent to her pharmacy

## 2019-07-29 NOTE — Telephone Encounter (Signed)
Please call patient regarding the Linzess she has been taking  -  Ph# [phone]

## 2019-08-01 ENCOUNTER — Other Ambulatory Visit (INDEPENDENT_AMBULATORY_CARE_PROVIDER_SITE_OTHER): Payer: Self-pay | Admitting: Nurse Practitioner

## 2019-08-03 ENCOUNTER — Other Ambulatory Visit (HOSPITAL_COMMUNITY): Payer: Self-pay | Admitting: Hematology

## 2019-08-20 ENCOUNTER — Telehealth (INDEPENDENT_AMBULATORY_CARE_PROVIDER_SITE_OTHER): Payer: Self-pay | Admitting: Nurse Practitioner

## 2019-08-20 DIAGNOSIS — K5900 Constipation, unspecified: Secondary | ICD-10-CM

## 2019-08-20 MED ORDER — LINACLOTIDE 290 MCG PO CAPS: 290.0000 ug | ORAL_CAPSULE | Freq: Every day | ORAL | 1 refills | Status: AC

## 2019-08-20 NOTE — Telephone Encounter (Signed)
I called the patient back.  Amitiza was ineffective regarding her constipation.  She wishes to go back on Linzess to 90 mcg once daily but she needs a new prescription for this dose.  However, with taking Linzess and MiraLAX she still feels constipated, does not feel emptied.  She may take 1 Dulcolax every third night if no response she can take 2 Dulcolax tablets every third night.  She will call the office as needed.

## 2019-08-26 ENCOUNTER — Other Ambulatory Visit (HOSPITAL_COMMUNITY): Payer: Self-pay | Admitting: Hematology

## 2019-08-26 MED ORDER — LACTULOSE 10 GM/15ML PO SOLN: 10.0000 g | Freq: Four times a day (QID) | ORAL | 5 refills | Status: AC | PRN

## 2019-08-31 ENCOUNTER — Other Ambulatory Visit (HOSPITAL_COMMUNITY): Payer: Self-pay

## 2019-08-31 ENCOUNTER — Other Ambulatory Visit (INDEPENDENT_AMBULATORY_CARE_PROVIDER_SITE_OTHER): Payer: Self-pay | Admitting: Nurse Practitioner

## 2019-09-03 ENCOUNTER — Inpatient Hospital Stay (HOSPITAL_COMMUNITY): Payer: Medicare Other

## 2019-09-03 ENCOUNTER — Emergency Department (HOSPITAL_COMMUNITY): Payer: Medicare Other

## 2019-09-03 ENCOUNTER — Other Ambulatory Visit: Payer: Self-pay

## 2019-09-03 ENCOUNTER — Inpatient Hospital Stay (HOSPITAL_COMMUNITY)
Admission: EM | Admit: 2019-09-03 | Discharge: 2019-09-08 | DRG: 070 | Disposition: A | Discharge status: Rehabilitation Facility | Payer: Medicare Other | Attending: Internal Medicine | Admitting: Internal Medicine

## 2019-09-03 ENCOUNTER — Encounter (HOSPITAL_COMMUNITY): Payer: Self-pay | Admitting: Emergency Medicine

## 2019-09-03 DIAGNOSIS — N182 Chronic kidney disease, stage 2 (mild): Secondary | ICD-10-CM | POA: Diagnosis not present

## 2019-09-03 DIAGNOSIS — G5621 Lesion of ulnar nerve, right upper limb: Secondary | ICD-10-CM | POA: Diagnosis not present

## 2019-09-03 DIAGNOSIS — Z66 Do not resuscitate: Secondary | ICD-10-CM | POA: Diagnosis present

## 2019-09-03 DIAGNOSIS — G0490 Encephalitis and encephalomyelitis, unspecified: Secondary | ICD-10-CM | POA: Diagnosis present

## 2019-09-03 DIAGNOSIS — G8110 Spastic hemiplegia affecting unspecified side: Secondary | ICD-10-CM | POA: Diagnosis not present

## 2019-09-03 DIAGNOSIS — A812 Progressive multifocal leukoencephalopathy: Secondary | ICD-10-CM | POA: Diagnosis not present

## 2019-09-03 DIAGNOSIS — Z79899 Other long term (current) drug therapy: Secondary | ICD-10-CM

## 2019-09-03 DIAGNOSIS — R29703 NIHSS score 3: Secondary | ICD-10-CM | POA: Diagnosis present

## 2019-09-03 DIAGNOSIS — K5900 Constipation, unspecified: Secondary | ICD-10-CM | POA: Diagnosis present

## 2019-09-03 DIAGNOSIS — Z90710 Acquired absence of both cervix and uterus: Secondary | ICD-10-CM

## 2019-09-03 DIAGNOSIS — K6289 Other specified diseases of anus and rectum: Secondary | ICD-10-CM | POA: Diagnosis present

## 2019-09-03 DIAGNOSIS — F329 Major depressive disorder, single episode, unspecified: Secondary | ICD-10-CM | POA: Diagnosis not present

## 2019-09-03 DIAGNOSIS — K219 Gastro-esophageal reflux disease without esophagitis: Secondary | ICD-10-CM | POA: Diagnosis present

## 2019-09-03 DIAGNOSIS — F32A Depression, unspecified: Secondary | ICD-10-CM | POA: Diagnosis present

## 2019-09-03 DIAGNOSIS — M21371 Foot drop, right foot: Secondary | ICD-10-CM | POA: Diagnosis present

## 2019-09-03 DIAGNOSIS — R296 Repeated falls: Secondary | ICD-10-CM | POA: Diagnosis present

## 2019-09-03 DIAGNOSIS — G629 Polyneuropathy, unspecified: Secondary | ICD-10-CM | POA: Diagnosis present

## 2019-09-03 DIAGNOSIS — F418 Other specified anxiety disorders: Secondary | ICD-10-CM | POA: Diagnosis present

## 2019-09-03 DIAGNOSIS — I129 Hypertensive chronic kidney disease with stage 1 through stage 4 chronic kidney disease, or unspecified chronic kidney disease: Secondary | ICD-10-CM | POA: Diagnosis present

## 2019-09-03 DIAGNOSIS — M792 Neuralgia and neuritis, unspecified: Secondary | ICD-10-CM | POA: Diagnosis not present

## 2019-09-03 DIAGNOSIS — I739 Peripheral vascular disease, unspecified: Secondary | ICD-10-CM | POA: Diagnosis present

## 2019-09-03 DIAGNOSIS — R799 Abnormal finding of blood chemistry, unspecified: Secondary | ICD-10-CM | POA: Diagnosis not present

## 2019-09-03 DIAGNOSIS — N183 Chronic kidney disease, stage 3 unspecified: Secondary | ICD-10-CM | POA: Diagnosis present

## 2019-09-03 DIAGNOSIS — D649 Anemia, unspecified: Secondary | ICD-10-CM | POA: Diagnosis present

## 2019-09-03 DIAGNOSIS — G8191 Hemiplegia, unspecified affecting right dominant side: Secondary | ICD-10-CM | POA: Diagnosis present

## 2019-09-03 DIAGNOSIS — T380X5A Adverse effect of glucocorticoids and synthetic analogues, initial encounter: Secondary | ICD-10-CM | POA: Diagnosis not present

## 2019-09-03 DIAGNOSIS — F411 Generalized anxiety disorder: Secondary | ICD-10-CM | POA: Diagnosis not present

## 2019-09-03 DIAGNOSIS — N1831 Chronic kidney disease, stage 3a: Secondary | ICD-10-CM | POA: Diagnosis not present

## 2019-09-03 DIAGNOSIS — G9389 Other specified disorders of brain: Secondary | ICD-10-CM | POA: Diagnosis present

## 2019-09-03 DIAGNOSIS — C719 Malignant neoplasm of brain, unspecified: Secondary | ICD-10-CM | POA: Diagnosis not present

## 2019-09-03 DIAGNOSIS — I959 Hypotension, unspecified: Secondary | ICD-10-CM | POA: Diagnosis not present

## 2019-09-03 DIAGNOSIS — G939 Disorder of brain, unspecified: Secondary | ICD-10-CM | POA: Diagnosis present

## 2019-09-03 DIAGNOSIS — Z853 Personal history of malignant neoplasm of breast: Secondary | ICD-10-CM

## 2019-09-03 DIAGNOSIS — R9431 Abnormal electrocardiogram [ECG] [EKG]: Secondary | ICD-10-CM | POA: Diagnosis not present

## 2019-09-03 DIAGNOSIS — Z9181 History of falling: Secondary | ICD-10-CM

## 2019-09-03 DIAGNOSIS — C9110 Chronic lymphocytic leukemia of B-cell type not having achieved remission: Secondary | ICD-10-CM | POA: Diagnosis present

## 2019-09-03 DIAGNOSIS — M898X1 Other specified disorders of bone, shoulder: Secondary | ICD-10-CM | POA: Diagnosis not present

## 2019-09-03 DIAGNOSIS — M25552 Pain in left hip: Secondary | ICD-10-CM | POA: Diagnosis not present

## 2019-09-03 DIAGNOSIS — I69393 Ataxia following cerebral infarction: Secondary | ICD-10-CM

## 2019-09-03 DIAGNOSIS — I639 Cerebral infarction, unspecified: Secondary | ICD-10-CM

## 2019-09-03 DIAGNOSIS — Z20822 Contact with and (suspected) exposure to covid-19: Secondary | ICD-10-CM | POA: Diagnosis present

## 2019-09-03 DIAGNOSIS — G8929 Other chronic pain: Secondary | ICD-10-CM | POA: Diagnosis present

## 2019-09-03 DIAGNOSIS — E039 Hypothyroidism, unspecified: Secondary | ICD-10-CM | POA: Diagnosis present

## 2019-09-03 DIAGNOSIS — K117 Disturbances of salivary secretion: Secondary | ICD-10-CM | POA: Diagnosis not present

## 2019-09-03 DIAGNOSIS — I6389 Other cerebral infarction: Secondary | ICD-10-CM | POA: Diagnosis not present

## 2019-09-03 DIAGNOSIS — D509 Iron deficiency anemia, unspecified: Secondary | ICD-10-CM | POA: Diagnosis not present

## 2019-09-03 DIAGNOSIS — D508 Other iron deficiency anemias: Secondary | ICD-10-CM | POA: Diagnosis not present

## 2019-09-03 DIAGNOSIS — R4189 Other symptoms and signs involving cognitive functions and awareness: Secondary | ICD-10-CM | POA: Diagnosis not present

## 2019-09-03 DIAGNOSIS — Z8673 Personal history of transient ischemic attack (TIA), and cerebral infarction without residual deficits: Secondary | ICD-10-CM

## 2019-09-03 DIAGNOSIS — R52 Pain, unspecified: Secondary | ICD-10-CM | POA: Diagnosis not present

## 2019-09-03 DIAGNOSIS — R270 Ataxia, unspecified: Secondary | ICD-10-CM | POA: Diagnosis not present

## 2019-09-03 DIAGNOSIS — M6281 Muscle weakness (generalized): Secondary | ICD-10-CM | POA: Diagnosis not present

## 2019-09-03 DIAGNOSIS — N319 Neuromuscular dysfunction of bladder, unspecified: Secondary | ICD-10-CM | POA: Diagnosis not present

## 2019-09-03 DIAGNOSIS — Z800 Family history of malignant neoplasm of digestive organs: Secondary | ICD-10-CM

## 2019-09-03 DIAGNOSIS — Z7189 Other specified counseling: Secondary | ICD-10-CM | POA: Diagnosis not present

## 2019-09-03 DIAGNOSIS — D72829 Elevated white blood cell count, unspecified: Secondary | ICD-10-CM | POA: Diagnosis not present

## 2019-09-03 DIAGNOSIS — G588 Other specified mononeuropathies: Secondary | ICD-10-CM | POA: Diagnosis not present

## 2019-09-03 DIAGNOSIS — E785 Hyperlipidemia, unspecified: Secondary | ICD-10-CM | POA: Diagnosis present

## 2019-09-03 DIAGNOSIS — R531 Weakness: Secondary | ICD-10-CM | POA: Diagnosis present

## 2019-09-03 DIAGNOSIS — Z79890 Hormone replacement therapy (postmenopausal): Secondary | ICD-10-CM

## 2019-09-03 DIAGNOSIS — R739 Hyperglycemia, unspecified: Secondary | ICD-10-CM | POA: Diagnosis present

## 2019-09-03 DIAGNOSIS — D72820 Lymphocytosis (symptomatic): Secondary | ICD-10-CM | POA: Diagnosis not present

## 2019-09-03 DIAGNOSIS — K5903 Drug induced constipation: Secondary | ICD-10-CM | POA: Diagnosis not present

## 2019-09-03 DIAGNOSIS — G894 Chronic pain syndrome: Secondary | ICD-10-CM | POA: Diagnosis not present

## 2019-09-03 DIAGNOSIS — Z87891 Personal history of nicotine dependence: Secondary | ICD-10-CM

## 2019-09-03 DIAGNOSIS — Z515 Encounter for palliative care: Secondary | ICD-10-CM | POA: Diagnosis not present

## 2019-09-03 DIAGNOSIS — R7309 Other abnormal glucose: Secondary | ICD-10-CM | POA: Diagnosis not present

## 2019-09-03 LAB — COMPREHENSIVE METABOLIC PANEL
ALT: 18 U/L (ref 0–44)
AST: 24 U/L (ref 15–41)
Albumin: 4.4 g/dL (ref 3.5–5.0)
Alkaline Phosphatase: 74 U/L (ref 38–126)
Anion gap: 13 (ref 5–15)
BUN: 11 mg/dL (ref 8–23)
CO2: 24 mmol/L (ref 22–32)
Calcium: 9.2 mg/dL (ref 8.9–10.3)
Chloride: 103 mmol/L (ref 98–111)
Creatinine, Ser: 1.05 mg/dL — ABNORMAL HIGH (ref 0.44–1.00)
GFR calc Af Amer: >60 mL/min (ref >60)
GFR calc non Af Amer: 53 mL/min — ABNORMAL LOW (ref >60)
Glucose, Bld: 103 mg/dL — ABNORMAL HIGH (ref 70–99)
Potassium: 4 mmol/L (ref 3.5–5.1)
Sodium: 140 mmol/L (ref 135–145)
Total Bilirubin: 1.2 mg/dL (ref 0.3–1.2)
Total Protein: 7.3 g/dL (ref 6.5–8.1)

## 2019-09-03 LAB — URINALYSIS, ROUTINE W REFLEX MICROSCOPIC
Bacteria, UA: NONE SEEN
Bilirubin Urine: NEGATIVE
Glucose, UA: NEGATIVE mg/dL
Ketones, ur: 5 mg/dL — AB
Leukocytes,Ua: NEGATIVE
Nitrite: NEGATIVE
Protein, ur: NEGATIVE mg/dL
RBC / HPF: >50 RBC/hpf — ABNORMAL HIGH (ref 0–5)
Specific Gravity, Urine: 1.019 (ref 1.005–1.030)
pH: 5 (ref 5.0–8.0)

## 2019-09-03 LAB — RAPID URINE DRUG SCREEN, HOSP PERFORMED
Amphetamines: NOT DETECTED
Barbiturates: NOT DETECTED
Benzodiazepines: NOT DETECTED
Cocaine: NOT DETECTED
Opiates: NOT DETECTED
Tetrahydrocannabinol: NOT DETECTED

## 2019-09-03 LAB — CBC
HCT: 39.3 % (ref 36.0–46.0)
Hemoglobin: 11.2 g/dL — ABNORMAL LOW (ref 12.0–15.0)
MCH: 25.5 pg — ABNORMAL LOW (ref 26.0–34.0)
MCHC: 28.5 g/dL — ABNORMAL LOW (ref 30.0–36.0)
MCV: 89.5 fL (ref 80.0–100.0)
Platelets: 266 10*3/uL (ref 150–400)
RBC: 4.39 MIL/uL (ref 3.87–5.11)
RDW: 15.6 % — ABNORMAL HIGH (ref 11.5–15.5)
WBC: 103.5 10*3/uL (ref 4.0–10.5)
nRBC: 0 % (ref 0.0–0.2)

## 2019-09-03 LAB — DIFFERENTIAL
Abs Immature Granulocytes: 0.18 10*3/uL — ABNORMAL HIGH (ref 0.00–0.07)
Basophils Absolute: 0.1 10*3/uL (ref 0.0–0.1)
Basophils Relative: 0 %
Eosinophils Absolute: 0.1 10*3/uL (ref 0.0–0.5)
Eosinophils Relative: 0 %
Immature Granulocytes: 0 %
Lymphocytes Relative: 93 %
Lymphs Abs: 96.4 10*3/uL — ABNORMAL HIGH (ref 0.7–4.0)
Monocytes Absolute: 1.6 10*3/uL — ABNORMAL HIGH (ref 0.1–1.0)
Monocytes Relative: 2 %
Neutro Abs: 5.3 10*3/uL (ref 1.7–7.7)
Neutrophils Relative %: 5 %

## 2019-09-03 LAB — PROTIME-INR
INR: 0.9 (ref 0.8–1.2)
Prothrombin Time: 12.3 seconds (ref 11.4–15.2)

## 2019-09-03 LAB — APTT: aPTT: 21 seconds — ABNORMAL LOW (ref 24–36)

## 2019-09-03 LAB — ETHANOL: Alcohol, Ethyl (B): <10 mg/dL (ref <10)

## 2019-09-03 MED ORDER — STROKE: EARLY STAGES OF RECOVERY BOOK
Freq: Once | Status: DC
  Filled 2019-09-03: qty 1

## 2019-09-03 MED ORDER — ENOXAPARIN SODIUM 40 MG/0.4ML ~~LOC~~ SOLN
40.0000 mg | SUBCUTANEOUS | Status: DC
  Administered 2019-09-03 – 2019-09-04 (×2): 40 mg via SUBCUTANEOUS
  Filled 2019-09-03 (×2): qty 0.4

## 2019-09-03 MED ORDER — ROSUVASTATIN CALCIUM 20 MG PO TABS: 20.0000 mg | ORAL_TABLET | Freq: Every day | ORAL | Status: DC

## 2019-09-03 MED ORDER — LACTULOSE 10 GM/15ML PO SOLN: 10.0000 g | Freq: Four times a day (QID) | ORAL | Status: DC | PRN

## 2019-09-03 MED ORDER — CLONAZEPAM 0.5 MG PO TABS
0.5000 mg | ORAL_TABLET | Freq: Two times a day (BID) | ORAL | Status: DC
  Administered 2019-09-03 – 2019-09-08 (×8): 0.5 mg via ORAL
  Filled 2019-09-03 (×9): qty 1

## 2019-09-03 MED ORDER — ACETAMINOPHEN 325 MG PO TABS: 650.0000 mg | ORAL_TABLET | ORAL | Status: DC | PRN

## 2019-09-03 MED ORDER — ACETAMINOPHEN 160 MG/5ML PO SOLN: 650.0000 mg | ORAL | Status: DC | PRN

## 2019-09-03 MED ORDER — METHADONE HCL 10 MG PO TABS: 10.0000 mg | ORAL_TABLET | Freq: Two times a day (BID) | ORAL | Status: DC

## 2019-09-03 MED ORDER — ROSUVASTATIN CALCIUM 20 MG PO TABS
20.0000 mg | ORAL_TABLET | Freq: Every day | ORAL | Status: DC
  Administered 2019-09-04 – 2019-09-08 (×5): 20 mg via ORAL
  Filled 2019-09-03 (×8): qty 1

## 2019-09-03 MED ORDER — ACETAMINOPHEN 650 MG RE SUPP: 650.0000 mg | RECTAL | Status: DC | PRN

## 2019-09-03 MED ORDER — LEVOTHYROXINE SODIUM 100 MCG PO TABS
100.0000 ug | ORAL_TABLET | Freq: Every day | ORAL | Status: DC
  Administered 2019-09-04 – 2019-09-08 (×5): 100 ug via ORAL
  Filled 2019-09-03: qty 2
  Filled 2019-09-03 (×4): qty 1

## 2019-09-03 MED ORDER — PANTOPRAZOLE SODIUM 40 MG PO TBEC
40.0000 mg | DELAYED_RELEASE_TABLET | Freq: Two times a day (BID) | ORAL | Status: DC
  Administered 2019-09-04 – 2019-09-08 (×10): 40 mg via ORAL
  Filled 2019-09-03 (×10): qty 1

## 2019-09-03 MED ORDER — LINACLOTIDE 145 MCG PO CAPS
290.0000 ug | ORAL_CAPSULE | Freq: Every day | ORAL | Status: DC
  Administered 2019-09-04 – 2019-09-08 (×5): 290 ug via ORAL
  Filled 2019-09-03: qty 1
  Filled 2019-09-03 (×2): qty 2
  Filled 2019-09-03 (×2): qty 1
  Filled 2019-09-03 (×2): qty 2
  Filled 2019-09-03: qty 1
  Filled 2019-09-03: qty 2

## 2019-09-03 MED ORDER — DEXAMETHASONE 4 MG PO TABS
4.0000 mg | ORAL_TABLET | Freq: Every day | ORAL | Status: DC
  Administered 2019-09-03 – 2019-09-04 (×2): 4 mg via ORAL
  Filled 2019-09-03 (×2): qty 1

## 2019-09-03 MED ORDER — LEVETIRACETAM 250 MG PO TABS
250.0000 mg | ORAL_TABLET | Freq: Two times a day (BID) | ORAL | Status: DC
  Administered 2019-09-03 – 2019-09-08 (×10): 250 mg via ORAL
  Filled 2019-09-03 (×10): qty 1

## 2019-09-03 MED ORDER — POLYETHYLENE GLYCOL 3350 17 G PO PACK
17.0000 g | PACK | Freq: Every day | ORAL | Status: DC
  Administered 2019-09-04 – 2019-09-05 (×2): 17 g via ORAL
  Filled 2019-09-03 (×2): qty 1

## 2019-09-03 MED ORDER — DULOXETINE HCL 60 MG PO CPEP
60.0000 mg | ORAL_CAPSULE | Freq: Every day | ORAL | Status: DC
  Administered 2019-09-04 – 2019-09-08 (×5): 60 mg via ORAL
  Filled 2019-09-03: qty 1
  Filled 2019-09-03: qty 2
  Filled 2019-09-03 (×3): qty 1

## 2019-09-03 MED ORDER — ASPIRIN 325 MG PO TABS: 325.0000 mg | ORAL_TABLET | Freq: Every day | ORAL | Status: DC

## 2019-09-03 MED ORDER — SODIUM CHLORIDE 0.9 % IV SOLN: INTRAVENOUS | Status: AC

## 2019-09-03 MED ORDER — ASPIRIN 300 MG RE SUPP: 300.0000 mg | Freq: Every day | RECTAL | Status: DC

## 2019-09-03 MED ORDER — METHADONE HCL 5 MG PO TABS
15.0000 mg | ORAL_TABLET | Freq: Every day | ORAL | Status: DC
  Administered 2019-09-04 – 2019-09-08 (×5): 15 mg via ORAL
  Filled 2019-09-03 (×4): qty 1
  Filled 2019-09-03 (×2): qty 2

## 2019-09-03 MED ORDER — PANTOPRAZOLE SODIUM 20 MG PO TBEC
20.0000 mg | DELAYED_RELEASE_TABLET | Freq: Two times a day (BID) | ORAL | Status: DC
  Filled 2019-09-03 (×2): qty 1

## 2019-09-03 MED ORDER — METHADONE HCL 10 MG PO TABS
10.0000 mg | ORAL_TABLET | Freq: Every day | ORAL | Status: DC
  Administered 2019-09-04 – 2019-09-08 (×5): 10 mg via ORAL
  Filled 2019-09-03 (×5): qty 1

## 2019-09-03 MED ORDER — LUBIPROSTONE 8 MCG PO CAPS
8.0000 ug | ORAL_CAPSULE | Freq: Two times a day (BID) | ORAL | Status: DC
  Filled 2019-09-03 (×7): qty 1

## 2019-09-03 MED ORDER — MORPHINE SULFATE 15 MG PO TABS: 15.0000 mg | ORAL_TABLET | ORAL | Status: DC | PRN

## 2019-09-03 MED ORDER — LURASIDONE HCL 40 MG PO TABS
40.0000 mg | ORAL_TABLET | Freq: Every day | ORAL | Status: DC
  Administered 2019-09-03 – 2019-09-08 (×6): 40 mg via ORAL
  Filled 2019-09-03 (×9): qty 1

## 2019-09-03 MED ORDER — LACTULOSE ENCEPHALOPATHY 10 GM/15ML PO SOLN: 10.0000 g | Freq: Two times a day (BID) | ORAL | Status: DC | PRN

## 2019-09-03 NOTE — ED Provider Notes (Signed)
Has had multiple falls and weakness over the last few weeks - just the R leg and foot - getting worse - now falling repeatedly - walker at baseline but keeps falling over to the right - daughter states she is dragging the foot.  CT shows new L subacute parietal stroke,  Exam shows some RUE weakness as well as RLE weakness.  She also has some limb ataxia with dysmetria of the right upper extremity and has noted that she has been having difficulty using her hand to right recently.  There is some drift and weak grip on the R side.    New stroke, needs admission.  EKG performed September 03, 2019 at 5:42 PM shows sinus tachycardia rate of 107, slight leftward axis, left anterior fascicular block, no ST changes or T wave abnormalities.  Impression sinus tachycardia with nonspecific findings   Medical screening examination/treatment/procedure(s) were conducted as a shared visit with non-physician practitioner(s) and myself.  I personally evaluated the patient during the encounter.  Clinical Impression:   Final diagnoses:  Cerebrovascular accident (CVA), unspecified mechanism ([place])  CLL (chronic lymphocytic leukemia) ()  Chronic renal insufficiency, stage 3 (moderate)         Noemi Chapel, MD 09/03/19 2131

## 2019-09-03 NOTE — ED Notes (Addendum)
Date and time results received: 09/03/19 6:01 PM    Test: WBC Critical Value: 103.5  Name of Provider Notified: Evalee Jefferson  Orders Received? Or Actions Taken?: see orders

## 2019-09-03 NOTE — ED Notes (Signed)
Pt passed swallow screen

## 2019-09-03 NOTE — Consult Note (Signed)
[facility] Tele[facility]urology Consult Services  Stat Consult  Date of Service:   09/03/2019 20:06:07  Impression:     .  G81.9 - Hemiplegia, unspecified  Comments/Sign-Out: Patient MRI and CTs were reviewed. 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 The etiology of this is not clear. She has been in remission from her breast cancer since 52. She has CLL which usually does not metastasize to the brain. At this point I think she needs to be admitted to the hospital for primary versus secondary brain tumor work-up. Would need an MRI of the head with contrast. Would also need CT of the abdomen chest pelvis and other work-up and would need oncology and neurosurgical consultations along with neurology consultation. I would start her on Decadron 4 mg every 8 hours. I would also put her on Keppra 250 twice daily. All this was discussed with the patient and the ED attending in detail.  CT HEAD: Reviewed  Metrics: [facility] Notification Time: 09/03/2019 20:03:42 Stamp Time: 09/03/2019 20:06:07 Callback Response Time: 09/03/2019 20:07:06  Our recommendations are outlined below.  Recommendations:     [name] Kitchen  Keppra 250 twice daily and Decadron 4 mg every 8 hours   Imaging Studies:     .  MRI Head with and Without Contrast  Disposition: Neurology Follow Up Recommended  Sign Out:     .  Discussed with Emergency Department Provider  ----------------------------------------------------------------------------------------------------  Chief Complaint: Right-sided weakness  History of Present Illness: Patient is a 73 year old Female.  Extremely pleasant 73 year old female with past medical history of hyperlipidemia, CLL and breast cancer came to the hospital because of right-sided weakness. She reports she started falling  few weeks ago and had approximately 8 falls in the past 3 weeks. She feels her right leg gives out. The weakness of the right side has progressively worsened. She denies any problems with her speech or swallowing. Denies any visual symptoms. Denies any urinary or stool incontinence. Denies any visual symptoms    Past Medical History:     . Hyperlipidemia     . There is NO history of Hypertension     . There is NO history of Diabetes Mellitus     . There is NO history of Atrial Fibrillation     . There is NO history of Coronary Artery Disease     . There is NO history of Stroke  Anticoagulant use:  No  Antiplatelet use: No     Examination: BP(132/96), Pulse(86), Blood Glucose(103)  Neuro Exam:  General: Alert,Awake, Oriented to Time, Place, Person  Speech: Fluent:  Language: Intact:  Face: Symmetric:  Facial Sensation: Intact:  Visual Fields: Intact:  Extraocular Movements: Intact:  Motor Exam: Drift: RUE, RLE  Sensation: Intact:  Coordination: Intact:  Right arm strength was 4/5 in the right leg was 3/5  Patient/Family was informed the Neurology Consult would happen via TeleHealth consult by way of interactive audio and video telecommunications and consented to receiving care in this manner.  Due to the immediate potential for life-threatening deterioration due to underlying acute neurologic illness, I spent 30 minutes providing critical care. This time includes time for face to face visit via telemedicine, review of medical records, imaging studies and discussion of findings with providers, the patient and/or family.   Dr Faustino Congress   [facility] [phone]  Case  [id number]   

## 2019-09-03 NOTE — ED Notes (Signed)
Called [facility] for protonix and latuda

## 2019-09-03 NOTE — ED Triage Notes (Signed)
Patient complains of fall this morning when right leg gave out. Patient states 8 falls in past two weeks.

## 2019-09-03 NOTE — ED Provider Notes (Signed)
[facility] EMERGENCY DEPARTMENT Provider Note   CSN: [id number] Arrival date & time: 09/03/19  1234     History Chief Complaint  Patient presents with  . Fall    Renee [name] is a 73 y.o. female presenting for evaluation of frequent falls and increasing weakness in her right leg and foot over the past 3 weeks. She reports a history of peripheral neuropathy followed by Dr. Caprice Beaver. She initially thought her symptoms were related to this condition. She denies history of low back pain or injury.  Her pcp has scheduled an MRI of her lumbar spine for next week.  However, with increasing episodes of falls and inability to support herself using the right leg, she presents for more expedient evaluation. She denies pain from her fall, denies head injury.  She also denies dizziness, vertigo, n/v, headache or neck pain. Daughter at the bedside has noticed that she has difficulty lifting her leg and foot, catches her dragging and holding the right foot in internal rotation with attempts to ambulate.    The history is provided by the patient and a relative.  Fall Pertinent negatives include no chest pain, no abdominal pain, no headaches and no shortness of breath.       Past Medical History:  Diagnosis Date  . Chronic renal disease, stage 3, moderately decreased glomerular filtration rate between 30-59 mL/min/1.73 square meter 12/27/2017  . Constipation   . Depression   . History of breast cancer 12/27/2017  . Hypertension   . Leukemia ([place])    CLL  . PVD (peripheral vascular disease) ([place])   . Thyroid disease     Patient Active Problem List   Diagnosis Date Noted  . Constipation 07/08/2019  . GERD (gastroesophageal reflux disease) 07/08/2019  . History of breast cancer 12/27/2017  . Chronic renal disease, stage 3, moderately decreased glomerular filtration rate between 30-59 mL/min/1.73 square meter 12/27/2017  . 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 Chronic  depression 05/14/2015  . Rectal pain, chronic 02/10/2015  . Chronic pain 12/28/2013  . 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 Hypothyroidism 01/07/2013  . Vitamin D deficiency 01/07/2013    Past Surgical History:  Procedure Laterality Date  . ABDOMINAL HYSTERECTOMY    . BREAST SURGERY     Left Breast Lumpectomy  . COLON SURGERY    . HERNIA REPAIR  2000   Hiatal hernia repair       OB History   No obstetric history on file.     No family history on file.  Social History   Tobacco Use  . Smoking status: Former Smoker    Packs/day: 0.75    Types: Cigarettes    Start date: 09/19/1962    Quit date: 09/20/1979    Years since quitting: 39.9  . Smokeless tobacco: Never Used  Substance Use Topics  . Alcohol use: No    Alcohol/week: 0.0 standard drinks  . Drug use: No    Home Medications Prior to Admission medications   Medication Sig Start Date End Date Taking? Authorizing Provider  Calcium Carbonate-Vitamin D (CALCIUM HIGH POTENCY/VITAMIN D) 600-200 MG-UNIT TABS Take by mouth daily.     [provider]  clonazePAM (KLONOPIN) 0.5 MG tablet Take 0.5 mg by mouth 2 (two) times daily.     [provider]  DULoxetine (CYMBALTA) 60 MG capsule Take 60 mg by mouth daily.    [provider]  ENULOSE 10 GM/15ML SOLN TAKE 15 MLS (10 G TOTAL) BY MOUTH TWO (2) (  TWO) TIMES DAILY AS NEEDED FOR MILD CONSTIPATION OR SEVERE CONSTIPATION.  08/04/19   Roger Shelter, FNP  KRILL OIL PO Take 900 mg by mouth daily.    [provider]  lactulose (CHRONULAC) 10 GM/15ML solution Take 15 mLs (10 g total) by mouth 4 (four) times daily as needed for mild constipation or severe constipation. 08/26/19   Roger Shelter, FNP  levothyroxine (SYNTHROID, LEVOTHROID) 100 MCG tablet Take 100 mcg by mouth daily before breakfast.    [provider]  linaclotide Rolan Lipa) 290 MCG CAPS capsule Take 1 capsule (290 mcg total) by mouth daily before breakfast. 08/20/19   Noralyn Pick, NP  Rolan Lipa 72 MCG capsule  08/01/19   [provider]  lubiprostone (AMITIZA) 8 MCG capsule Take 1 capsule (8 mcg total) by mouth 2 (two) times daily with a meal. 07/29/19   Kennedy-Smith, Patrecia Pour, NP  lurasidone (LATUDA) 40 MG TABS tablet Take 40 mg by mouth daily with breakfast.    [provider]  methadone (DOLOPHINE) 10 MG tablet 1.5 tabs ( 15 mg ) bid and 1 tab at HS, 4 per day 03/24/19   [provider]  morphine (MSIR) 15 MG tablet Take 15 mg by mouth 4 (four) times daily as needed. 06/24/19   [provider]  pantoprazole (PROTONIX) 20 MG tablet Take 1 tablet (20 mg total) by mouth 2 (two) times daily. 07/08/19   Noralyn Pick, NP  pantoprazole (PROTONIX) 40 MG tablet Take 40 mg by mouth daily. 08/07/19   [provider]  polyethylene glycol (MIRALAX / GLYCOLAX) packet Take 17 g by mouth daily.    [provider]  rosuvastatin (CRESTOR) 20 MG tablet Take 20 mg by mouth daily.    [provider]  senna-docusate (SENOKOT-S) 8.6-50 MG tablet Take 1 tablet by mouth 2 (two) times daily. Patient not taking: Reported on 07/09/2019 05/13/19   Roger Shelter, FNP  lactulose (CHRONULAC) 10 GM/15ML solution Take 15 mLs (10 g total) by mouth 2 (two) times daily as needed for mild constipation or severe constipation. 05/26/19   Roger Shelter, FNP    Allergies    Bupropion, Clonidine derivatives, Codeine, Diclofenac-misoprostol, Olanzapine, Pregabalin, Valdecoxib, and Zonisamide  Review of Systems   Review of Systems  Constitutional: Negative for fever.  HENT: Negative for congestion.   Eyes: Negative.   Respiratory: Negative for chest tightness and shortness of breath.   Cardiovascular: Negative for chest pain.  Gastrointestinal: Negative for abdominal pain, nausea and vomiting.  Genitourinary: Negative.   Musculoskeletal: Negative for arthralgias, back pain, joint swelling and neck pain.  Skin: Negative.   Negative for rash and wound.  Neurological: Positive for weakness and numbness. Negative for dizziness, seizures, syncope, light-headedness and headaches.  Psychiatric/Behavioral: Negative.     Physical Exam Updated Vital Signs BP 124/64 (BP Location: Right Arm)   Pulse (!) 102   Temp 98.8 F (37.1 C) (Oral)   Resp 18   Ht 6\' 1"  (1.854 m)   Wt 83.5 kg   SpO2 96%   BMI 24.28 kg/m   Physical Exam Vitals and nursing note reviewed.  Constitutional:      Appearance: She is well-developed.  HENT:     Head: Normocephalic and atraumatic.  Eyes:     Conjunctiva/sclera: Conjunctivae normal.  Cardiovascular:     Rate and Rhythm: Normal rate and regular rhythm.     Heart sounds: Normal heart sounds.  Pulmonary:     Effort:  Pulmonary effort is normal.     Breath sounds: Normal breath sounds. No wheezing.  Abdominal:     General: Bowel sounds are normal.     Palpations: Abdomen is soft.     Tenderness: There is no abdominal tenderness.  Musculoskeletal:        General: Normal range of motion.     Cervical back: Normal range of motion.  Skin:    General: Skin is warm and dry.  Neurological:     Mental Status: She is alert and oriented to person, place, and time.     Cranial Nerves: No cranial nerve deficit.     Sensory: Sensory deficit present.     Motor: Weakness present.     Coordination: Coordination abnormal. Impaired rapid alternating movements.     Deep Tendon Reflexes:     Reflex Scores:      Bicep reflexes are 3+ on the right side and 2+ on the left side.    Comments: Hyper reflexive right bicep dtr. Positive pronator drift right. Ataxia with attempts at RAM with right hand.  Pt is able to raise her right leg, slowly and with difficulty.  Positive right foot drop.       ED Results / Procedures / Treatments   Labs (all labs ordered are listed, but only abnormal results are displayed) Labs Reviewed  APTT - Abnormal; Notable for the following components:      Result  Value   aPTT 21 (*)    All other components within normal limits  CBC - Abnormal; Notable for the following components:   WBC 103.5 (*)    Hemoglobin 11.2 (*)    MCH 25.5 (*)    MCHC 28.5 (*)    RDW 15.6 (*)    All other components within normal limits  DIFFERENTIAL - Abnormal; Notable for the following components:   Lymphs Abs 96.4 (*)    Monocytes Absolute 1.6 (*)    Abs Immature Granulocytes 0.18 (*)    All other components within normal limits  COMPREHENSIVE METABOLIC PANEL - Abnormal; Notable for the following components:   Glucose, Bld 103 (*)    Creatinine, Ser 1.05 (*)    GFR calc non Af Amer 53 (*)    All other components within normal limits  URINALYSIS, ROUTINE W REFLEX MICROSCOPIC - Abnormal; Notable for the following components:   APPearance HAZY (*)    Hgb urine dipstick MODERATE (*)    Ketones, ur 5 (*)    RBC / HPF >50 (*)    All other components within normal limits  ETHANOL  PROTIME-INR  RAPID URINE DRUG SCREEN, HOSP PERFORMED  PATHOLOGIST SMEAR REVIEW  I-STAT CHEM 8, ED    EKG None  Radiology CT Head Wo Contrast  Result Date: 09/03/2019 CLINICAL DATA:  Recent fall with right leg weakness, initial encounter EXAM: CT HEAD WITHOUT CONTRAST TECHNIQUE: Contiguous axial images were obtained from the base of the skull through the vertex without intravenous contrast. COMPARISON:  None. FINDINGS: Brain: Area of decreased attenuation is noted in the deep white matter of the left parietal lobe near the vertex consistent with subacute ischemia. No findings to suggest acute infarct are seen. No findings to suggest acute hemorrhage or space-occupying mass lesion are noted. Vascular: No hyperdense vessel or unexpected calcification. Skull: Normal. Negative for fracture or focal lesion. Sinuses/Orbits: No acute finding. Other: None. IMPRESSION: Area consistent with prior ischemia within the deep white matter of the left parietal lobe as described. No acute  infarct is noted.  Electronically Signed   By: Inez Catalina M.D.   On: 09/03/2019 14:50    Procedures Procedures (including critical care time)  Medications Ordered in ED Medications - No data to display  ED Course  I have reviewed the triage vital signs and the nursing notes.  Pertinent labs & imaging results that were available during my care of the patient were reviewed by me and considered in my medical decision making (see chart for details).    MDM Rules/Calculators/A&P                      Pt with subacute cva with sx present x 2-3 weeks.  Leukocytosis c/w her diagnosis of CLL, chronic renal insufficiency.  Labs reviewed and interpreted, comparisons made with previous labs.  Ct imaging also reviewed revealing for subacute left sided cva.    Discussed with Dr. Linda Hedges with [facility] for admission for remaining cva work up.  He agrees with admission.  Requested teleneuro consult which has been ordered.   Final Clinical Impression(s) / ED Diagnoses Final diagnoses:  Cerebrovascular accident (CVA), unspecified mechanism ([place])  CLL (chronic lymphocytic leukemia) ([place])  Chronic renal insufficiency, stage 3 (moderate)    Rx / DC Orders ED Discharge Orders    None       Landis Martins 09/03/19 1831    Noemi Chapel, MD 09/04/19 [phone]

## 2019-09-03 NOTE — H&P (Signed)
History and Physical    Renee [name] [id number] DOB: 15-May-1946 DOA: 09/03/2019  PCP: Thea Alken (Confirm with patient/family/NH records and if not entered, this has to be entered at [facility] point of entry) Patient coming from: From home  I have personally briefly reviewed patient's old medical records in [place]  Chief Complaint: Progressive weakness on the right side with ataxia  HPI: Renee [name] is a 73 y.o. female with medical history significant of stage I CLL, and a neuralgia followed at the [facility] pain clinic, hypertension, CKD 3, thyroid disease.  Patient reports that several weeks ago she started to develop increasing paresthesia in her right foot.  She does have a history of peripheral neuropathy to which she attributed her symptoms.  Over the intervening period of time she had progressive paresthesia in the right leg and increasing weakness.  She reports that she has had multiple falls due to poor balance.  She has had no loss of vision.  Had no severe headache.  She has had no tremor.  She had no fevers or chills.  Because of her progressive weakness and falling she presents to the antipain emergency department for evaluation (For level 3, the HPI must include 4+ descriptors: Location, Quality, Severity, Duration, Timing, Context, modifying factors, associated signs/symptoms and/or status of 3+ chronic problems.)  (Please avoid self-populating past medical history here) (The initial 2-3 lines should be focused and good to copy and paste in the HPI section of the daily progress note).  ED Course: Patient is hemodynamically stable in the emergency department.  Her evaluation did reveal some right-sided weakness and ataxia.  CT scan of the head revealed a left parietal abnormality.  MRI/MRA reveals 2 cm cystic lesion deep left parietal region with edema, no vascular occlusion. She is referred to [facility] for admission work-up for cystic brain lesion.  Review of Systems: As per  HPI otherwise 10 point review of systems negative.    Past Medical History:  Diagnosis Date   Chronic renal disease, stage 3, moderately decreased glomerular filtration rate between 30-59 mL/min/1.73 square meter 12/27/2017   Constipation    Depression    History of breast cancer 12/27/2017   Hypertension    Leukemia ([place])    CLL   PVD (peripheral vascular disease) (HCC)    Thyroid disease     Past Surgical History:  Procedure Laterality Date   ABDOMINAL HYSTERECTOMY     BREAST SURGERY     Left Breast Lumpectomy   COLON SURGERY     HERNIA REPAIR  2000   Hiatal hernia repair     Soc Hx - HSG, married at 47 and stayed married 51 years. She was widowed 11 years ago. She has one son, one daughter, 2 grandchildren. She is a retired Therapist, sports carrier. She worked in Press [employer] prior to that. She lives in her own home in [place], her son lives with her. She has a Living Will and does not want Cardiac resuscitation or mechanical ventilation.   reports that she quit smoking about 39 years ago. Her smoking use included cigarettes. She started smoking about 56 years ago. She smoked 0.75 packs per day. She has never used smokeless tobacco. She reports that she does not drink alcohol or use drugs.  Allergies  Allergen Reactions   Bupropion Other (See Comments)    Fidgety    Clonidine Derivatives Other (See Comments)   Codeine Nausea Only   Diclofenac-Misoprostol Other (See Comments)    Fidgety  Olanzapine Other (See Comments)    Fidgety    Pregabalin Other (See Comments) and Rash   Valdecoxib Other (See Comments)   Zonisamide Other (See Comments)    Fidgety     No family history on file. Unacceptable: Noncontributory, unremarkable, or negative. Acceptable: Family history reviewed and not pertinent (If you reviewed it)  Prior to Admission medications   Medication Sig Start Date End Date Taking? Authorizing Provider  rosuvastatin (CRESTOR) 20 MG tablet Take 20 mg  by mouth daily.   Yes [provider]  Calcium Carbonate-Vitamin D (CALCIUM HIGH POTENCY/VITAMIN D) 600-200 MG-UNIT TABS Take by mouth daily.     [provider]  clonazePAM (KLONOPIN) 0.5 MG tablet Take 0.5 mg by mouth 2 (two) times daily.     [provider]  DULoxetine (CYMBALTA) 60 MG capsule Take 60 mg by mouth daily.    [provider]  ENULOSE 10 GM/15ML SOLN TAKE 15 MLS (10 G TOTAL) BY MOUTH TWO (2) (TWO) TIMES DAILY AS NEEDED FOR MILD CONSTIPATION OR SEVERE CONSTIPATION.  08/04/19   Roger Shelter, FNP  KRILL OIL PO Take 900 mg by mouth daily.    [provider]  lactulose (CHRONULAC) 10 GM/15ML solution Take 15 mLs (10 g total) by mouth 4 (four) times daily as needed for mild constipation or severe constipation. 08/26/19   Roger Shelter, FNP  levothyroxine (SYNTHROID, LEVOTHROID) 100 MCG tablet Take 100 mcg by mouth daily before breakfast.    [provider]  linaclotide Rolan Lipa) 290 MCG CAPS capsule Take 1 capsule (290 mcg total) by mouth daily before breakfast. 08/20/19   Noralyn Pick, NP  Rolan Lipa 72 MCG capsule  08/01/19   [provider]  lubiprostone (AMITIZA) 8 MCG capsule Take 1 capsule (8 mcg total) by mouth 2 (two) times daily with a meal. 07/29/19   Kennedy-Smith, Patrecia Pour, NP  lurasidone (LATUDA) 40 MG TABS tablet Take 40 mg by mouth daily with breakfast.    [provider]  methadone (DOLOPHINE) 10 MG tablet 1.5 tabs ( 15 mg ) bid and 1 tab at HS, 4 per day 03/24/19   [provider]  morphine (MSIR) 15 MG tablet Take 15 mg by mouth 4 (four) times daily as needed. 06/24/19   [provider]  pantoprazole (PROTONIX) 20 MG tablet Take 1 tablet (20 mg total) by mouth 2 (two) times daily. 07/08/19   Noralyn Pick, NP  pantoprazole (PROTONIX) 40 MG tablet Take 40 mg by mouth daily. 08/07/19   [provider]  polyethylene glycol (MIRALAX / GLYCOLAX) packet Take 17 g  by mouth daily.    [provider]  senna-docusate (SENOKOT-S) 8.6-50 MG tablet Take 1 tablet by mouth 2 (two) times daily. Patient not taking: Reported on 07/09/2019 05/13/19   Roger Shelter, FNP  lactulose (CHRONULAC) 10 GM/15ML solution Take 15 mLs (10 g total) by mouth 2 (two) times daily as needed for mild constipation or severe constipation. 05/26/19   Roger Shelter, FNP    Physical Exam: Vitals:   09/03/19 1302 09/03/19 1800 09/03/19 1830 09/03/19 1835  BP:  136/77 132/76 (!) 132/96  Pulse:  92 87 86  Resp:  16 19 16   Temp:      TempSrc:      SpO2:  95% 93% 100%  Weight: 83.5 kg     Height: 6\' 1"  (1.854 m)       Constitutional: NAD, calm, comfortable Vitals:   09/03/19 1302  09/03/19 1800 09/03/19 1830 09/03/19 1835  BP:  136/77 132/76 (!) 132/96  Pulse:  92 87 86  Resp:  16 19 16   Temp:      TempSrc:      SpO2:  95% 93% 100%  Weight: 83.5 kg     Height: 6\' 1"  (1.854 m)      General appearance:  WNWD woman in no distress. Eyes: PERRL, lids and conjunctivae normal ENMT: Mucous membranes are moist. Posterior pharynx clear of any exudate or lesions.Normal dentition.  Neck: normal, supple, no masses, no thyromegaly Nodes - no adenopathy appreciated cervical, supraclavicular or inquinal regions Respiratory: clear to auscultation bilaterally, no wheezing, no crackles. Normal respiratory effort. No accessory muscle use.  Cardiovascular: Regular rate and rhythm, no murmurs / rubs / gallops. No extremity edema. Cold feet with trace pedal pulses. No carotid bruits.  Abdomen: no tenderness, no masses palpated. No hepatosplenomegaly. Bowel sounds positive.  Musculoskeletal: no clubbing / cyanosis. No joint deformity upper and lower extremities. Good ROM, no contractures. Normal muscle tone.  Skin: no rashes, lesions, ulcers. No induration Neurologic: CN 2-12 - PERRLA, EOMI, normal facial movement and symmetry, tongue is midline w/o fasciulations, eye-sight grossly normal,  hearing normal. DTR - 2+ at biceps and radial tendons, trace at right patellar tendon, 1+ left patellar tendon. Sensation diminished to light touch right lateral distal LE vs medial, sensation normal left distal LE. No tremor, + pronator drift right UE. Did not stand.   Psychiatric: Normal judgment and insight. Alert and oriented x 3. Normal mood.   (Anything < 9 systems with 2 bullets each down codes to level 1) (If patient refuses exam cant bill higher level) (Make sure to document decubitus ulcers present on admission -- if possible -- and whether patient has chronic indwelling catheter at time of admission)  Labs on Admission: I have personally reviewed following labs and imaging studies  CBC: Recent Labs  Lab 09/03/19 1716  WBC 103.5*  NEUTROABS 5.3  HGB 11.2*  HCT 39.3  MCV 89.5  PLT 123456   Basic Metabolic Panel: Recent Labs  Lab 09/03/19 1716  NA 140  K 4.0  CL 103  CO2 24  GLUCOSE 103*  BUN 11  CREATININE 1.05*  CALCIUM 9.2   GFR: Estimated Creatinine Clearance: 58.5 mL/min (A) (by C-G formula based on SCr of 1.05 mg/dL (H)). Liver Function Tests: Recent Labs  Lab 09/03/19 1716  AST 24  ALT 18  ALKPHOS 74  BILITOT 1.2  PROT 7.3  ALBUMIN 4.4   No results for input(s): LIPASE, AMYLASE in the last 168 hours. No results for input(s): AMMONIA in the last 168 hours. Coagulation Profile: Recent Labs  Lab 09/03/19 1716  INR 0.9   Cardiac Enzymes: No results for input(s): CKTOTAL, CKMB, CKMBINDEX, TROPONINI in the last 168 hours. BNP (last 3 results) No results for input(s): PROBNP in the last 8760 hours. HbA1C: No results for input(s): HGBA1C in the last 72 hours. CBG: No results for input(s): GLUCAP in the last 168 hours. Lipid Profile: No results for input(s): CHOL, HDL, LDLCALC, TRIG, CHOLHDL, LDLDIRECT in the last 72 hours. Thyroid Function Tests: No results for input(s): TSH, T4TOTAL, FREET4, T3FREE, THYROIDAB in the last 72 hours. Anemia  Panel: No results for input(s): VITAMINB12, FOLATE, FERRITIN, TIBC, IRON, RETICCTPCT in the last 72 hours. Urine analysis:    Component Value Date/Time   COLORURINE YELLOW 09/03/2019 1641   APPEARANCEUR HAZY (A) 09/03/2019 1641   LABSPEC 1.019 09/03/2019 1641  PHURINE 5.0 09/03/2019 1641   GLUCOSEU NEGATIVE 09/03/2019 1641   HGBUR MODERATE (A) 09/03/2019 1641   BILIRUBINUR NEGATIVE 09/03/2019 1641   KETONESUR 5 (A) 09/03/2019 1641   PROTEINUR NEGATIVE 09/03/2019 1641   NITRITE NEGATIVE 09/03/2019 1641   LEUKOCYTESUR NEGATIVE 09/03/2019 1641    Radiological Exams on Admission: DG Chest 2 View  Result Date: 09/03/2019 CLINICAL DATA:  Fall EXAM: CHEST - 2 VIEW COMPARISON:  CT 05/06/2019 FINDINGS: No acute airspace disease, pleural effusion or pneumothorax. Normal heart size. Aortic atherosclerosis. Nodular opacities at the bilateral hila, felt to correspond to adenopathy noted on previous CT. Moderate hiatal hernia. No pneumothorax. Degenerative changes of the spine. IMPRESSION: 1. No active cardiopulmonary disease. 2. Nodular opacities at the hila, felt to correspond to adenopathy noted on previous CT. 3. Moderate hiatal hernia. Electronically Signed   By: Donavan Foil M.D.   On: 09/03/2019 20:06   CT Head Wo Contrast  Result Date: 09/03/2019 CLINICAL DATA:  Recent fall with right leg weakness, initial encounter EXAM: CT HEAD WITHOUT CONTRAST TECHNIQUE: Contiguous axial images were obtained from the base of the skull through the vertex without intravenous contrast. COMPARISON:  None. FINDINGS: Brain: Area of decreased attenuation is noted in the deep white matter of the left parietal lobe near the vertex consistent with subacute ischemia. No findings to suggest acute infarct are seen. No findings to suggest acute hemorrhage or space-occupying mass lesion are noted. Vascular: No hyperdense vessel or unexpected calcification. Skull: Normal. Negative for fracture or focal lesion.  Sinuses/Orbits: No acute finding. Other: None. IMPRESSION: Area consistent with prior ischemia within the deep white matter of the left parietal lobe as described. No acute infarct is noted. Electronically Signed   By: Inez Catalina M.D.   On: 09/03/2019 14:50   MR ANGIO HEAD WO CONTRAST  Result Date: 09/03/2019 CLINICAL DATA:  [place] today with right leg weakness. EXAM: MRI HEAD WITHOUT CONTRAST MRA HEAD WITHOUT CONTRAST TECHNIQUE: Multiplanar, multiecho pulse sequences of the brain and surrounding structures were obtained without intravenous contrast. Angiographic images of the head were obtained using MRA technique without contrast. COMPARISON:  Head CT same day FINDINGS: MRI HEAD FINDINGS Brain: The brainstem and cerebellum are normal. Cerebral hemispheres show age related volume loss with some frontal predominance. Right cerebral hemisphere shows minimal small vessel change of the white matter. Left cerebral hemisphere shows mild small vessel change of the white matter. At the left parietal vertex, within the deep white matter, there is a 2 cm in diameter cystic lesion with surrounding low level restricted diffusion and some adjacent vasogenic edema. This does not look like a typical ischemic infarction. No evidence of susceptibility artifact to suggest resolving hematoma. The differential diagnosis includes tumor and cerebritis, both infectious inflammatory and autoimmune. This includes progressive multifocal leukoencephalopathy. I would suggest postcontrast imaging to evaluate this more completely. Vascular: Major vessels at the base of the brain show flow. Skull and upper cervical spine: Negative Sinuses/Orbits: Clear/normal Other: None MRA HEAD FINDINGS Both internal carotid arteries are widely patent into the brain. No siphon stenosis. The anterior and middle cerebral vessels are patent without proximal stenosis, aneurysm or vascular malformation. Both vertebral arteries are widely patent to the basilar.  No basilar stenosis. Posterior circulation branch vessels appear normal. IMPRESSION: Normal MR angiography. Findings not suggestive of ischemic infarction. 2 cm cystic lesion in the left parietal deep white matter with regional vasogenic edema and a possible wall showing some restricted diffusion. Differential diagnosis is tumor versus cerebritis. Postcontrast  imaging would be suggested for more complete evaluation. Electronically Signed   By: Nelson Chimes M.D.   On: 09/03/2019 20:10   MR BRAIN WO CONTRAST  Result Date: 09/03/2019 CLINICAL DATA:  [place] today with right leg weakness. EXAM: MRI HEAD WITHOUT CONTRAST MRA HEAD WITHOUT CONTRAST TECHNIQUE: Multiplanar, multiecho pulse sequences of the brain and surrounding structures were obtained without intravenous contrast. Angiographic images of the head were obtained using MRA technique without contrast. COMPARISON:  Head CT same day FINDINGS: MRI HEAD FINDINGS Brain: The brainstem and cerebellum are normal. Cerebral hemispheres show age related volume loss with some frontal predominance. Right cerebral hemisphere shows minimal small vessel change of the white matter. Left cerebral hemisphere shows mild small vessel change of the white matter. At the left parietal vertex, within the deep white matter, there is a 2 cm in diameter cystic lesion with surrounding low level restricted diffusion and some adjacent vasogenic edema. This does not look like a typical ischemic infarction. No evidence of susceptibility artifact to suggest resolving hematoma. The differential diagnosis includes tumor and cerebritis, both infectious inflammatory and autoimmune. This includes progressive multifocal leukoencephalopathy. I would suggest postcontrast imaging to evaluate this more completely. Vascular: Major vessels at the base of the brain show flow. Skull and upper cervical spine: Negative Sinuses/Orbits: Clear/normal Other: None MRA HEAD FINDINGS Both internal carotid arteries  are widely patent into the brain. No siphon stenosis. The anterior and middle cerebral vessels are patent without proximal stenosis, aneurysm or vascular malformation. Both vertebral arteries are widely patent to the basilar. No basilar stenosis. Posterior circulation branch vessels appear normal. IMPRESSION: Normal MR angiography. Findings not suggestive of ischemic infarction. 2 cm cystic lesion in the left parietal deep white matter with regional vasogenic edema and a possible wall showing some restricted diffusion. Differential diagnosis is tumor versus cerebritis. Postcontrast imaging would be suggested for more complete evaluation. Electronically Signed   By: Nelson Chimes M.D.   On: 09/03/2019 20:10    EKG: Independently reviewed.  EKG revealed a sinus tachycardia with a wandering baseline  Assessment/Plan Active Problems:   Right sided weakness   Chronic depression   Chronic pain   CLL (chronic lymphocytic leukemia) (HCC)   Chronic renal disease, stage 3, moderately decreased glomerular filtration rate between 30-59 mL/min/1.73 square meter   Hypothyroidism   GERD (gastroesophageal reflux disease)  (please populate well all problems here in Problem List. (For example, if patient is on BP meds at home and you resume or decide to hold them, it is a problem that needs to be her. Same for CAD, COPD, HLD and so on)   1. Right sided weakness - with slow evolution of symptoms and finding of a cystic mass left parietal region working diagnosis is brain lesion/tumor with edema and right sided weakness Plan Transfer [facility] at [facility]  MRI with contrast - to better delineate diagnosis  Decadron 4 mg daily for brain edema  Keppra - sz prophylaxis  Neurology consult-to continue  Oncology consult - evaluate for possible metastatic disease in patient with h/o         Breast cancer '99 now with new brain lesion  May need radiation oncology consult - sterotactic gamma knife  surgery  PT/OT eval  2. Stage I CLL - WBC stable, Hgb stable, platelets stable  3. CKD - Cr 1.05, CrCL 53 - stable.  4. 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 Hypothyroidism - continue present meds, check TSH  7. GERD - continue home meds    DVT prophylaxis: lovenox (Lovenox/Heparin/SCD's/anticoagulated/None (if comfort care) Code Status: DNR (Full/Partial (specify details) Family Communication: spoke with daughter Charlyne Petrin  [phone] [facility] name, relationship. Do not write "discussed with patient". Specify tel # if discussed over the phone) Disposition Plan: to be determined (specify when and where you expect patient to be discharged) Consults called: neurology -tele neuro consult done, will need neuro f/u; will need oncology consult for metastatic workup (with names) Admission status: inpatient - [facility] (inpatient / obs / tele / medical floor / SDU)   Adella Hare MD [facility] Pager [phone]  If 7PM-7AM, please contact night-coverage [email or web address] Password [facility]  09/03/2019, 8:41 PM

## 2019-09-04 ENCOUNTER — Inpatient Hospital Stay (HOSPITAL_COMMUNITY): Payer: Medicare Other

## 2019-09-04 DIAGNOSIS — I6389 Other cerebral infarction: Secondary | ICD-10-CM

## 2019-09-04 DIAGNOSIS — G939 Disorder of brain, unspecified: Secondary | ICD-10-CM | POA: Diagnosis present

## 2019-09-04 DIAGNOSIS — K219 Gastro-esophageal reflux disease without esophagitis: Secondary | ICD-10-CM

## 2019-09-04 LAB — LIPID PANEL
Cholesterol: 110 mg/dL (ref 0–200)
HDL: 35 mg/dL — ABNORMAL LOW (ref >40)
LDL Cholesterol: 52 mg/dL (ref 0–99)
Total CHOL/HDL Ratio: 3.1 RATIO
Triglycerides: 117 mg/dL (ref <150)
VLDL: 23 mg/dL (ref 0–40)

## 2019-09-04 LAB — GLUCOSE, CAPILLARY
Glucose-Capillary: 138 mg/dL — ABNORMAL HIGH (ref 70–99)
Glucose-Capillary: 167 mg/dL — ABNORMAL HIGH (ref 70–99)

## 2019-09-04 LAB — ECHOCARDIOGRAM COMPLETE
Height: 73 in
Weight: 2944 oz

## 2019-09-04 LAB — HEMOGLOBIN A1C
Hgb A1c MFr Bld: 5.4 % (ref 4.8–5.6)
Mean Plasma Glucose: 108.28 mg/dL

## 2019-09-04 LAB — SARS CORONAVIRUS 2 (TAT 6-24 HRS): SARS Coronavirus 2: NEGATIVE

## 2019-09-04 MED ORDER — INSULIN ASPART 100 UNIT/ML ~~LOC~~ SOLN
0.0000 [IU] | Freq: Three times a day (TID) | SUBCUTANEOUS | Status: DC
  Administered 2019-09-06: 1 [IU] via SUBCUTANEOUS

## 2019-09-04 MED ORDER — LACTULOSE 10 GM/15ML PO SOLN
10.0000 g | Freq: Two times a day (BID) | ORAL | Status: DC
  Administered 2019-09-04 – 2019-09-08 (×9): 10 g via ORAL
  Filled 2019-09-04 (×11): qty 30

## 2019-09-04 MED ORDER — INSULIN ASPART 100 UNIT/ML ~~LOC~~ SOLN: 0.0000 [IU] | Freq: Every day | SUBCUTANEOUS | Status: DC

## 2019-09-04 MED ORDER — DEXAMETHASONE 4 MG PO TABS
4.0000 mg | ORAL_TABLET | Freq: Three times a day (TID) | ORAL | Status: DC
  Administered 2019-09-04 – 2019-09-08 (×12): 4 mg via ORAL
  Filled 2019-09-04 (×12): qty 1

## 2019-09-04 NOTE — ED Notes (Signed)
[facility] at bedside. Daughter took some belongings home and left 3 bags to go with pt to [facility]. [facility] aware.

## 2019-09-04 NOTE — Progress Notes (Signed)
  TTE Echo with EF of 70 to 75%. The left ventricle has hyperdynamic function. There is no left ventricular hypertrophy. Left ventricular diastolic parameters are indeterminate. The left ventricle has no regional wall motion abnormalities. Global right ventricle has normal systolic function

## 2019-09-04 NOTE — Plan of Care (Signed)
Received call by Dr. Denton Brick regarding this patient be transferred to [facility] for neurological consultation and further imaging. Imaging reviewed-there is a DWI positive lesion in the left ACA territory which has been read as suggestive of tumor versus cerebritis and postcontrast imaging has been suggested for evaluation.  Based on the history obtained, considerations include a subacute stroke.  Mass or cerebritis cannot be ruled out.  Patient will be seen by neurology service as schedule permits.  Postcontrast brain imaging has been ordered to be performed.  MRI is aware.  -- Amie Portland, MD [facility] Pager: [phone] If 7pm to 7am, please call on call as listed on AMION.

## 2019-09-04 NOTE — Progress Notes (Signed)
*  PRELIMINARY RESULTS* Echocardiogram 2D Echocardiogram has been performed.  Leavy Cella 09/04/2019, 1:44 PM

## 2019-09-04 NOTE — Progress Notes (Addendum)
Patient was seen by my partner and colleague Dr. Roxan Hockey at [facility] this AM and I am in agreement with his Assessment and Plan. Currently awaiting transfer to [facility] and further Neurological Workup and Assessment.  The patietn is a 74 yo female who currently has stage I CLL and neuralgia/peripheral neuropathy that is followed at [facility] pain clinic as well as hypertension, CKD stage III, hyper thyroidism as well as history of breast cancer who presented with recurrent falls with right-sided weakness and cranial imaging showed 2 cm parietal lobe lesion.  May need neurosurgical and Oncology evaluation once at [facility].  Telemetry visit Neurologist at [facility] recommended [place] for seizure prophylaxis and Decadron.  Will await for her arrival to [facility] and have Neurology evaluate her when she does.

## 2019-09-04 NOTE — Progress Notes (Signed)
Patient Demographics:    Renee [name], is a 74 y.o. female, DOB - July 26, 1946, [id number]  Admit date - 09/03/2019   Admitting Physician Neena Rhymes, MD  Outpatient Primary MD for the patient is Thea Alken  LOS - 1   Chief Complaint  Patient presents with  . 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 -Transfer to [facility] for neurology consult  A/p  1)Recurrent falls with right-sided weakness--- with cranial imaging studies showing 2 CM Left parietal lobe lesion--- tumor versus cerebritis -Awaiting transfer to [place]  for further neurology and possibly neurosurgical and oncology consults -Telemetry neurologist advised Keppra 250 twice daily for seizure prophylaxis, Decadron 4 mg every 8 hours -Likely stay coverage while on high-dose steroids  2) stage I  CLL----WBC over 100k, hematology/oncology consult at [facility]  3) peripheral neuropathy/pudendal neuralgia/chronic pain----continue home pain regimen including methadone  4) history of breast cancer--- apparently in remission since 1999.  Intracranial imaging does not suggest metastatic breast cancer  5) hypothyroidism--- levothyroxine 100 mcg daily  6) depressive disorder with anxiety --- stable, continue Latuda and continue Cymbalta 60 mg daily, clonazepam 0.5 mg twice daily  7)CKD III--stable at this time,  renal function appears to be at baseline, with a creatinine of 1.0 and GFR of 53  8)HLD-----LDL  52 with HDL of 35, continue Crestor 20 mg daily  9)IBS/constipation concerns----continue lactulose 10 g twice daily along with Linzess.  MiraLAX as ordered  A1c is 5.4  Disposition/Need for in-Hospital Stay- patient unable to be discharged at this time due to --- cystic lesion in the parietal  Lobe--requiring further work-up  Code Status : Full  Family Communication:   NA (patient is alert, awake and coherent) -Discussed with daughter at bedside  Disposition Plan  : TBD  Consults  :  Neurology   DVT Prophylaxis  :  Lovenox -   SCDs   Lab Results  Component Value Date   PLT 266 09/03/2019    Inpatient Medications  Scheduled Meds: . clonazePAM  0.5 mg Oral BID  . dexamethasone  4 mg Oral Daily  . DULoxetine  60 mg Oral Daily  . enoxaparin (LOVENOX) injection  40  mg Subcutaneous Q24H  . lactulose  10 g Oral BID  . levETIRAcetam  250 mg Oral BID  . levothyroxine  100 mcg Oral QAC breakfast  . linaclotide  290 mcg Oral QAC breakfast  . lurasidone  40 mg Oral QHS  . methadone  15 mg Oral Daily   And  . methadone  10 mg Oral q1800  . pantoprazole  40 mg Oral BID  . polyethylene glycol  17 g Oral Daily  . rosuvastatin  20 mg Oral q1800   Continuous Infusions: . sodium chloride 10 mL/hr at 09/04/19 0516   PRN Meds:.acetaminophen **OR** acetaminophen (TYLENOL) oral  liquid 160 mg/5 mL **OR** acetaminophen    Anti-infectives (From admission, onward)   None        Objective:   Vitals:   09/04/19 1000 09/04/19 1030 09/04/19 1100 09/04/19 1130  BP: 118/77 (!) 103/56 (!) 106/57 (!) 108/53  Pulse: 93 75 66 67  Resp: 16     Temp:      TempSrc:      SpO2: 96% 92% 92% 91%  Weight:      Height:        Wt Readings from Last 3 Encounters:  09/03/19 83.5 kg  07/09/19 86 kg  07/08/19 86.2 kg    No intake or output data in the 24 hours ending 09/04/19 1338   Physical Exam  Gen:- Awake Alert,  In no apparent distress  HEENT:- [place].AT, No sclera icterus Neck-Supple Neck,No JVD,.  Lungs-  CTAB , fair symmetrical air movement CV- S1, S2 normal, regular  Abd-  +ve B.Sounds, Abd Soft, No tenderness,    Extremity/Skin:- No  edema, pedal pulses present  Psych-affect is appropriate, oriented x3 Neuro-right-sided weakness noted with 4/5 strength in right lower extremity and right upper extremity, right lower extremity slightly weaker than right upper extremity , there is drift with exam of right upper and right lower extremity -No significant sensory loss,  no tremors   Data Review:   Micro Results No results found for this or any previous visit (from the past 240 hour(s)).  Radiology Reports DG Chest 2 View  Result Date: 09/03/2019 CLINICAL DATA:  Fall EXAM: CHEST - 2 VIEW COMPARISON:  CT 05/06/2019 FINDINGS: No acute airspace disease, pleural effusion or pneumothorax. Normal heart size. Aortic atherosclerosis. Nodular opacities at the bilateral hila, felt to correspond to adenopathy noted on previous CT. Moderate hiatal hernia. No pneumothorax. Degenerative changes of the spine. IMPRESSION: 1. No active cardiopulmonary disease. 2. Nodular opacities at the hila, felt to correspond to adenopathy noted on previous CT. 3. Moderate hiatal hernia. Electronically Signed   By: Donavan Foil M.D.   On: 09/03/2019 20:06   CT Head Wo Contrast  Result Date:  09/03/2019 CLINICAL DATA:  Recent fall with right leg weakness, initial encounter EXAM: CT HEAD WITHOUT CONTRAST TECHNIQUE: Contiguous axial images were obtained from the base of the skull through the vertex without intravenous contrast. COMPARISON:  None. FINDINGS: Brain: Area of decreased attenuation is noted in the deep white matter of the left parietal lobe near the vertex consistent with subacute ischemia. No findings to suggest acute infarct are seen. No findings to suggest acute hemorrhage or space-occupying mass lesion are noted. Vascular: No hyperdense vessel or unexpected calcification. Skull: Normal. Negative for fracture or focal lesion. Sinuses/Orbits: No acute finding. Other: None. IMPRESSION: Area consistent with prior ischemia within the deep white matter of the left parietal lobe as described. No acute infarct is noted. Electronically  Signed   By: Inez Catalina M.D.   On: 09/03/2019 14:50   MR ANGIO HEAD WO CONTRAST  Result Date: 09/03/2019 CLINICAL DATA:  [place] today with right leg weakness. EXAM: MRI HEAD WITHOUT CONTRAST MRA HEAD WITHOUT CONTRAST TECHNIQUE: Multiplanar, multiecho pulse sequences of the brain and surrounding structures were obtained without intravenous contrast. Angiographic images of the head were obtained using MRA technique without contrast. COMPARISON:  Head CT same day FINDINGS: MRI HEAD FINDINGS Brain: The brainstem and cerebellum are normal. Cerebral hemispheres show age related volume loss with some frontal predominance. Right cerebral hemisphere shows minimal small vessel change of the white matter. Left cerebral hemisphere shows mild small vessel change of the white matter. At the left parietal vertex, within the deep white matter, there is a 2 cm in diameter cystic lesion with surrounding low level restricted diffusion and some adjacent vasogenic edema. This does not look like a typical ischemic infarction. No evidence of susceptibility artifact to suggest resolving  hematoma. The differential diagnosis includes tumor and cerebritis, both infectious inflammatory and autoimmune. This includes progressive multifocal leukoencephalopathy. I would suggest postcontrast imaging to evaluate this more completely. Vascular: Major vessels at the base of the brain show flow. Skull and upper cervical spine: Negative Sinuses/Orbits: Clear/normal Other: None MRA HEAD FINDINGS Both internal carotid arteries are widely patent into the brain. No siphon stenosis. The anterior and middle cerebral vessels are patent without proximal stenosis, aneurysm or vascular malformation. Both vertebral arteries are widely patent to the basilar. No basilar stenosis. Posterior circulation branch vessels appear normal. IMPRESSION: Normal MR angiography. Findings not suggestive of ischemic infarction. 2 cm cystic lesion in the left parietal deep white matter with regional vasogenic edema and a possible wall showing some restricted diffusion. Differential diagnosis is tumor versus cerebritis. Postcontrast imaging would be suggested for more complete evaluation. Electronically Signed   By: Nelson Chimes M.D.   On: 09/03/2019 20:10   MR BRAIN WO CONTRAST  Result Date: 09/03/2019 CLINICAL DATA:  [place] today with right leg weakness. EXAM: MRI HEAD WITHOUT CONTRAST MRA HEAD WITHOUT CONTRAST TECHNIQUE: Multiplanar, multiecho pulse sequences of the brain and surrounding structures were obtained without intravenous contrast. Angiographic images of the head were obtained using MRA technique without contrast. COMPARISON:  Head CT same day FINDINGS: MRI HEAD FINDINGS Brain: The brainstem and cerebellum are normal. Cerebral hemispheres show age related volume loss with some frontal predominance. Right cerebral hemisphere shows minimal small vessel change of the white matter. Left cerebral hemisphere shows mild small vessel change of the white matter. At the left parietal vertex, within the deep white matter, there is a 2 cm  in diameter cystic lesion with surrounding low level restricted diffusion and some adjacent vasogenic edema. This does not look like a typical ischemic infarction. No evidence of susceptibility artifact to suggest resolving hematoma. The differential diagnosis includes tumor and cerebritis, both infectious inflammatory and autoimmune. This includes progressive multifocal leukoencephalopathy. I would suggest postcontrast imaging to evaluate this more completely. Vascular: Major vessels at the base of the brain show flow. Skull and upper cervical spine: Negative Sinuses/Orbits: Clear/normal Other: None MRA HEAD FINDINGS Both internal carotid arteries are widely patent into the brain. No siphon stenosis. The anterior and middle cerebral vessels are patent without proximal stenosis, aneurysm or vascular malformation. Both vertebral arteries are widely patent to the basilar. No basilar stenosis. Posterior circulation branch vessels appear normal. IMPRESSION: Normal MR angiography. Findings not suggestive of ischemic infarction. 2 cm cystic lesion in the  left parietal deep white matter with regional vasogenic edema and a possible wall showing some restricted diffusion. Differential diagnosis is tumor versus cerebritis. Postcontrast imaging would be suggested for more complete evaluation. Electronically Signed   By: Nelson Chimes M.D.   On: 09/03/2019 20:10     CBC Recent Labs  Lab 09/03/19 1716  WBC 103.5*  HGB 11.2*  HCT 39.3  PLT 266  MCV 89.5  MCH 25.5*  MCHC 28.5*  RDW 15.6*  LYMPHSABS 96.4*  MONOABS 1.6*  EOSABS 0.1  BASOSABS 0.1    Chemistries  Recent Labs  Lab 09/03/19 1716  NA 140  K 4.0  CL 103  CO2 24  GLUCOSE 103*  BUN 11  CREATININE 1.05*  CALCIUM 9.2  AST 24  ALT 18  ALKPHOS 74  BILITOT 1.2   ------------------------------------------------------------------------------------------------------------------ Recent Labs    09/04/19 0416  CHOL 110  HDL 35*  LDLCALC 52    TRIG 117  CHOLHDL 3.1    Lab Results  Component Value Date   HGBA1C 5.4 09/04/2019   ------------------------------------------------------------------------------------------------------------------ No results for input(s): TSH, T4TOTAL, T3FREE, THYROIDAB in the last 72 hours.  Invalid input(s): FREET3 ------------------------------------------------------------------------------------------------------------------ No results for input(s): VITAMINB12, FOLATE, FERRITIN, TIBC, IRON, RETICCTPCT in the last 72 hours.  Coagulation profile Recent Labs  Lab 09/03/19 1716  INR 0.9    No results for input(s): DDIMER in the last 72 hours.  Cardiac Enzymes No results for input(s): CKMB, TROPONINI, MYOGLOBIN in the last 168 hours.  Invalid input(s): CK ------------------------------------------------------------------------------------------------------------------ No results found for: BNP   Roxan Hockey M.D on 09/04/2019 at 1:38 PM  Go to [email or web address] - for contact info  [facility] - Office  [phone]

## 2019-09-04 NOTE — ED Notes (Signed)
Called [facility] earlier with bed assignment and for transport to [facility].

## 2019-09-04 NOTE — Consult Note (Signed)
NEURO HOSPITALIST CONSULT NOTE   Requestig physician: Dr. Denton Brick  Reason for Consult: Subacute postero-medial left parieto-frontal lobe lesion on MRI.   History obtained from:   Patient and Chart    HPI:                                                                                                                                          Renee [name] is an 74 y.o. female with a PMHx of stage I CLL, neuralgia, HTN, CKD 3 and thyroid disease, who initially presented to the [facility] ED after a fall on Thursday morning when her right leg gave out. She had been having falls over the past two weeks, a total of about 8. The right leg and foot weakness seemed to have started two weeks ago and had been progressively getting worse, with increased frequency of falls - she tends to fall over to the right and had been dragging her right foot. Also complained of difficulty using her hand to write. The patient uses a walker at baseline.   CT at [facility] showed a left parietal lobe hypodensity that appeared most consistent with a subacute stroke. Exam showed RUE and RLE weakness. Also noted was ataxia of the RUE.   MRI without contrast was then obtained to further evaluate the left parietofrontal lesion. On MRI, the lesion measuring 2 cm, was associated with regional vasogenic edema and a possible wall showing some restricted diffusion. It appeared less consistent with a stroke and more consistent with cerebritis or malignancy. MRA head was normal.   The patient was sent to [facility] for repeat MRI with contrast and Neurology evaluation.  On further interview here, she states that she first noticed the right sided weakness 3 weeks ago when her RLE gave out on her and she fell. Her RUE weakness only became noticeable in the past week. She has a history of breast cancer that was treated with surgery and radiation therapy, but no chemotherapy. It has not recurred, per patient.    Past Medical History:   Diagnosis Date  . Chronic renal disease, stage 3, moderately decreased glomerular filtration rate between 30-59 mL/min/1.73 square meter 12/27/2017  . Constipation   . Depression   . History of breast cancer 12/27/2017  . Hypertension   . Leukemia ([place])    CLL  . PVD (peripheral vascular disease) ([place])   . Thyroid disease     Past Surgical History:  Procedure Laterality Date  . ABDOMINAL HYSTERECTOMY    . BREAST SURGERY     Left Breast Lumpectomy  . COLON SURGERY    . HERNIA REPAIR  2000   Hiatal hernia repair      No family history on file.  Social History:  reports that she quit smoking about 39 years ago. Her smoking use included cigarettes. She started smoking about 56 years ago. She smoked 0.75 packs per day. She has never used smokeless tobacco. She reports that she does not drink alcohol or use drugs.  Allergies  Allergen Reactions  . Bupropion Other (See Comments)    Fidgety   . Clonidine Derivatives Other (See Comments)  . Codeine Nausea Only  . Diclofenac-Misoprostol Other (See Comments)    Fidgety   . Olanzapine Other (See Comments)    Fidgety   . Pregabalin Other (See Comments) and Rash  . Valdecoxib Other (See Comments)  . Zonisamide Other (See Comments)    Fidgety     HOME MEDICATIONS:                                                                                                                     No current facility-administered medications on file prior to encounter.   Current Outpatient Medications on File Prior to Encounter  Medication Sig Dispense Refill  . clonazePAM (KLONOPIN) 0.5 MG tablet Take 0.5 mg by mouth 2 (two) times daily.     . DULoxetine (CYMBALTA) 60 MG capsule Take 60 mg by mouth daily.    [name] Kitchen ENULOSE 10 GM/15ML SOLN TAKE 15 MLS (10 G TOTAL) BY MOUTH TWO (2) (TWO) TIMES DAILY AS NEEDED FOR MILD CONSTIPATION OR SEVERE CONSTIPATION.  236 mL 0  . KRILL OIL PO Take 900 mg by mouth daily.    [name] Kitchen levothyroxine (SYNTHROID,  LEVOTHROID) 100 MCG tablet Take 100 mcg by mouth daily before breakfast.    . linaclotide (LINZESS) 290 MCG CAPS capsule Take 1 capsule (290 mcg total) by mouth daily before breakfast. 30 capsule 1  . LINZESS 72 MCG capsule     . lurasidone (LATUDA) 40 MG TABS tablet Take 40 mg by mouth daily with breakfast.    . methadone (DOLOPHINE) 10 MG tablet 1.5 tabs ( 15 mg ) bid and 1 tab at HS, 4 per day    . pantoprazole (PROTONIX) 40 MG tablet Take 40 mg by mouth daily.    . polyethylene glycol (MIRALAX / GLYCOLAX) packet Take 17 g by mouth daily.    . rosuvastatin (CRESTOR) 20 MG tablet Take 20 mg by mouth daily.    [name] Kitchen senna-docusate (SENOKOT-S) 8.6-50 MG tablet Take 1 tablet by mouth 2 (two) times daily. 60 tablet 3  . Calcium Carbonate-Vitamin D (CALCIUM HIGH POTENCY/VITAMIN D) 600-200 MG-UNIT TABS Take by mouth daily.     [name] Kitchen lactulose (CHRONULAC) 10 GM/15ML solution Take 15 mLs (10 g total) by mouth 4 (four) times daily as needed for mild constipation or severe constipation. 236 mL 5  . lubiprostone (AMITIZA) 8 MCG capsule Take 1 capsule (8 mcg total) by mouth 2 (two) times daily with a meal. 60 capsule 0  . morphine (MSIR) 15 MG tablet Take 15 mg by mouth 4 (four) times daily as needed.    . pantoprazole (PROTONIX)  20 MG tablet Take 1 tablet (20 mg total) by mouth 2 (two) times daily. 60 tablet 1  . [DISCONTINUED] lactulose (CHRONULAC) 10 GM/15ML solution Take 15 mLs (10 g total) by mouth 2 (two) times daily as needed for mild constipation or severe constipation. 236 mL 0     ROS:                                                                                                                                       Denies any symptoms of infection, including no fever, SOB, N/V or diarrhea. No facial droop, language difficulty or vision changes. No headache. No left sided weakness. No CP or abdominal pain. Other symptom review as per HPI with comprehensive ROS otherwise negative.    Blood pressure (!)  115/55, pulse 84, temperature 98.3 F (36.8 C), temperature source Oral, resp. rate 17, height 6\' 1"  (1.854 m), weight 83.5 kg, SpO2 96 %.   General Examination:                                                                                                       Physical Exam  HEENT-  Bent/AT   Lungs- Respirations unlabored Extremities- No edema  Neurological Examination Mental Status: Alert, fully oriented, thought content appropriate.  Speech fluent without evidence of aphasia.  Able to follow all commands without difficulty. Cranial Nerves: II: Visual fields intact with no extinction to DSS.   III,IV, VI: No ptosis. EOMI with no nystagmus.  V,VII: Smile symmetric, facial temp sensation equal bilaterally VIII: hearing intact to conversation.  IX,X: Palate rises symmetrically XI: Symmetric XII: Midline tongue extension Motor: RUE with 4/5 deltoid weakness, otherwise 5/5. However, there is bradykinesia, mild dystonic posturing of the wrist and digits, slow finger tapping test and pronator drift. Also noted is increased latency of motor responses to commands on the right.  LUE 5/5 RLE 4/5 proximally, with 4-/5 ADF/APF LLE 5/5 Sensory: Temp and light touch intact throughout, bilaterally Deep Tendon Reflexes: 1+ right biceps and brachioradialis. 2+ left biceps and brachioradialis. Trace patellar reflexes. Right toe upgoing, left toe downgoing.  Cerebellar: No ataxia with FNF bilaterally; slowed movement noted on the right.  Gait: Deferred   Lab Results: Basic Metabolic Panel: Recent Labs  Lab 09/03/19 1716  NA 140  K 4.0  CL 103  CO2 24  GLUCOSE 103*  BUN 11  CREATININE 1.05*  CALCIUM 9.2    CBC: Recent Labs  Lab 09/03/19 1716  WBC  103.5*  NEUTROABS 5.3  HGB 11.2*  HCT 39.3  MCV 89.5  PLT 266    Cardiac Enzymes: No results for input(s): CKTOTAL, CKMB, CKMBINDEX, TROPONINI in the last 168 hours.  Lipid Panel: Recent Labs  Lab 09/04/19 0416  CHOL 110  TRIG  117  HDL 35*  CHOLHDL 3.1  VLDL 23  LDLCALC 52    Imaging: DG Chest 2 View  Result Date: 09/03/2019 CLINICAL DATA:  Fall EXAM: CHEST - 2 VIEW COMPARISON:  CT 05/06/2019 FINDINGS: No acute airspace disease, pleural effusion or pneumothorax. Normal heart size. Aortic atherosclerosis. Nodular opacities at the bilateral hila, felt to correspond to adenopathy noted on previous CT. Moderate hiatal hernia. No pneumothorax. Degenerative changes of the spine. IMPRESSION: 1. No active cardiopulmonary disease. 2. Nodular opacities at the hila, felt to correspond to adenopathy noted on previous CT. 3. Moderate hiatal hernia. Electronically Signed   By: Donavan Foil M.D.   On: 09/03/2019 20:06   CT Head Wo Contrast  Result Date: 09/03/2019 CLINICAL DATA:  Recent fall with right leg weakness, initial encounter EXAM: CT HEAD WITHOUT CONTRAST TECHNIQUE: Contiguous axial images were obtained from the base of the skull through the vertex without intravenous contrast. COMPARISON:  None. FINDINGS: Brain: Area of decreased attenuation is noted in the deep white matter of the left parietal lobe near the vertex consistent with subacute ischemia. No findings to suggest acute infarct are seen. No findings to suggest acute hemorrhage or space-occupying mass lesion are noted. Vascular: No hyperdense vessel or unexpected calcification. Skull: Normal. Negative for fracture or focal lesion. Sinuses/Orbits: No acute finding. Other: None. IMPRESSION: Area consistent with prior ischemia within the deep white matter of the left parietal lobe as described. No acute infarct is noted. Electronically Signed   By: Inez Catalina M.D.   On: 09/03/2019 14:50   MR ANGIO HEAD WO CONTRAST  Result Date: 09/03/2019 CLINICAL DATA:  [place] today with right leg weakness. EXAM: MRI HEAD WITHOUT CONTRAST MRA HEAD WITHOUT CONTRAST TECHNIQUE: Multiplanar, multiecho pulse sequences of the brain and surrounding structures were obtained without  intravenous contrast. Angiographic images of the head were obtained using MRA technique without contrast. COMPARISON:  Head CT same day FINDINGS: MRI HEAD FINDINGS Brain: The brainstem and cerebellum are normal. Cerebral hemispheres show age related volume loss with some frontal predominance. Right cerebral hemisphere shows minimal small vessel change of the white matter. Left cerebral hemisphere shows mild small vessel change of the white matter. At the left parietal vertex, within the deep white matter, there is a 2 cm in diameter cystic lesion with surrounding low level restricted diffusion and some adjacent vasogenic edema. This does not look like a typical ischemic infarction. No evidence of susceptibility artifact to suggest resolving hematoma. The differential diagnosis includes tumor and cerebritis, both infectious inflammatory and autoimmune. This includes progressive multifocal leukoencephalopathy. I would suggest postcontrast imaging to evaluate this more completely. Vascular: Major vessels at the base of the brain show flow. Skull and upper cervical spine: Negative Sinuses/Orbits: Clear/normal Other: None MRA HEAD FINDINGS Both internal carotid arteries are widely patent into the brain. No siphon stenosis. The anterior and middle cerebral vessels are patent without proximal stenosis, aneurysm or vascular malformation. Both vertebral arteries are widely patent to the basilar. No basilar stenosis. Posterior circulation branch vessels appear normal. IMPRESSION: Normal MR angiography. Findings not suggestive of ischemic infarction. 2 cm cystic lesion in the left parietal deep white matter with regional vasogenic edema and a possible wall showing  some restricted diffusion. Differential diagnosis is tumor versus cerebritis. Postcontrast imaging would be suggested for more complete evaluation. Electronically Signed   By: Nelson Chimes M.D.   On: 09/03/2019 20:10   MR BRAIN WO CONTRAST  Result Date:  09/03/2019 CLINICAL DATA:  [place] today with right leg weakness. EXAM: MRI HEAD WITHOUT CONTRAST MRA HEAD WITHOUT CONTRAST TECHNIQUE: Multiplanar, multiecho pulse sequences of the brain and surrounding structures were obtained without intravenous contrast. Angiographic images of the head were obtained using MRA technique without contrast. COMPARISON:  Head CT same day FINDINGS: MRI HEAD FINDINGS Brain: The brainstem and cerebellum are normal. Cerebral hemispheres show age related volume loss with some frontal predominance. Right cerebral hemisphere shows minimal small vessel change of the white matter. Left cerebral hemisphere shows mild small vessel change of the white matter. At the left parietal vertex, within the deep white matter, there is a 2 cm in diameter cystic lesion with surrounding low level restricted diffusion and some adjacent vasogenic edema. This does not look like a typical ischemic infarction. No evidence of susceptibility artifact to suggest resolving hematoma. The differential diagnosis includes tumor and cerebritis, both infectious inflammatory and autoimmune. This includes progressive multifocal leukoencephalopathy. I would suggest postcontrast imaging to evaluate this more completely. Vascular: Major vessels at the base of the brain show flow. Skull and upper cervical spine: Negative Sinuses/Orbits: Clear/normal Other: None MRA HEAD FINDINGS Both internal carotid arteries are widely patent into the brain. No siphon stenosis. The anterior and middle cerebral vessels are patent without proximal stenosis, aneurysm or vascular malformation. Both vertebral arteries are widely patent to the basilar. No basilar stenosis. Posterior circulation branch vessels appear normal. IMPRESSION: Normal MR angiography. Findings not suggestive of ischemic infarction. 2 cm cystic lesion in the left parietal deep white matter with regional vasogenic edema and a possible wall showing some restricted diffusion.  Differential diagnosis is tumor versus cerebritis. Postcontrast imaging would be suggested for more complete evaluation. Electronically Signed   By: Nelson Chimes M.D.   On: 09/03/2019 20:10   ECHOCARDIOGRAM COMPLETE  Result Date: 09/04/2019   ECHOCARDIOGRAM REPORT   Patient Name:   Renee [name] Date of Exam: 09/04/2019 Medical Rec #:  [id number]    Height:       73.0 in Accession #:    [id number]   Weight:       184.0 lb Date of Birth:  1946/07/31    BSA:          2.08 m Patient Age:    18 years     BP:           108/53 mmHg Patient Gender: F            HR:           67 bpm. Exam Location:  [facility] Procedure: 2D Echo Indications:    Stroke 434.91 / I163.9  History:        Patient has no prior history of Echocardiogram examinations.                 Risk Factors:Former Smoker and Hypertension. History of breast                 cancer, Chronic Renal Disease, GERD.  Sonographer:    Leavy Cella RDCS (AE) Referring Phys: [place]  1. Left ventricular ejection fraction, by visual estimation, is 70 to 75%. The left ventricle has hyperdynamic function. There is no left ventricular hypertrophy.  2. Left  ventricular diastolic parameters are indeterminate.  3. The left ventricle has no regional wall motion abnormalities.  4. Global right ventricle has normal systolic function.The right ventricular size is normal. No increase in right ventricular wall thickness.  5. Left atrial size was normal.  6. Right atrial size was normal.  7. Presence of pericardial fat pad.  8. The mitral valve is grossly normal. No evidence of mitral valve regurgitation.  9. The tricuspid valve is grossly normal. 10. The aortic valve is tricuspid. Aortic valve regurgitation is not visualized. 11. The pulmonic valve was grossly normal. Pulmonic valve regurgitation is trivial. 12. TR signal is inadequate for assessing pulmonary artery systolic pressure. 13. The inferior vena cava is normal in size with greater than 50%  respiratory variability, suggesting right atrial pressure of 3 mmHg. FINDINGS  Left Ventricle: Left ventricular ejection fraction, by visual estimation, is 70 to 75%. The left ventricle has hyperdynamic function. The left ventricle has no regional wall motion abnormalities. There is no left ventricular hypertrophy. Left ventricular diastolic parameters are indeterminate. Right Ventricle: The right ventricular size is normal. No increase in right ventricular wall thickness. Global RV systolic function is has normal systolic function. Left Atrium: Left atrial size was normal in size. Right Atrium: Right atrial size was normal in size Pericardium: There is no evidence of pericardial effusion. Presence of pericardial fat pad. Mitral Valve: The mitral valve is grossly normal. No evidence of mitral valve regurgitation. Tricuspid Valve: The tricuspid valve is grossly normal. Tricuspid valve regurgitation is trivial. Aortic Valve: The aortic valve is tricuspid. Aortic valve regurgitation is not visualized. Pulmonic Valve: The pulmonic valve was grossly normal. Pulmonic valve regurgitation is trivial. Pulmonic regurgitation is trivial. Aorta: The aortic root is normal in size and structure. Venous: The inferior vena cava is normal in size with greater than 50% respiratory variability, suggesting right atrial pressure of 3 mmHg. IAS/Shunts: No atrial level shunt detected by color flow Doppler.  LEFT VENTRICLE PLAX 2D LVIDd:         3.75 cm  Diastology LVIDs:         2.13 cm  LV e' lateral:   8.92 cm/s LV PW:         0.98 cm  LV E/e' lateral: 8.7 LV IVS:        0.96 cm  LV e' medial:    11.50 cm/s LVOT diam:     2.20 cm  LV E/e' medial:  6.8 LV SV:         45 ml LV SV Index:   21.71 LVOT Area:     3.80 cm  RIGHT VENTRICLE RV S prime:     12.40 cm/s TAPSE (M-mode): 2.2 cm LEFT ATRIUM             Index       RIGHT ATRIUM           Index LA diam:        2.60 cm 1.25 cm/m  RA Area:     11.20 cm LA Vol (A2C):   44.3 ml 21.33  ml/m RA Volume:   27.00 ml  13.00 ml/m LA Vol (A4C):   32.8 ml 15.79 ml/m LA Biplane Vol: 40.5 ml 19.50 ml/m   AORTA Ao Root diam: 2.80 cm MITRAL VALVE MV Area (PHT): 3.37 cm             SHUNTS MV PHT:        65.25 msec  Systemic Diam: 2.20 cm MV Decel Time: 225 msec MV E velocity: 77.70 cm/s 103 cm/s MV A velocity: 83.20 cm/s 70.3 cm/s MV E/A ratio:  0.93       1.5  Rozann Lesches MD Electronically signed by Rozann Lesches MD Signature Date/Time: 09/04/2019/1:53:34 PM    Final     Assessment: 74 year old female with DWI positive lesion in the left parieto-frontal region, read as tumor versus cerebritis. Subacute stroke is also on the DDx.  1. Exam reveals right sided motor deficits, consistent with the location of the lesion seen on MRI.  2. CLL. WBC is extremely elevated at 103.  3. MRI brain without contrast reveals a 2 cm mixed-signal lesion in the left parieto-frontal region with associated regional vasogenic edema and a possible wall showing some restricted diffusion. It appears less consistent with a stroke and more consistent with cerebritis or malignancy.  4. MRA head is normal.   Recommendations: 1. MRI brain add-on study with thin-slice pre- and post-contrast images to assess for possible enhancement of the left parietofrontal lesion.  2. CT of chest, abdomen and pelvis to assess for possible primary malignancy.  3. Neurology will continue to follow with you.   Electronically signed: Dr. Kerney Elbe 09/04/2019, 7:12 PM

## 2019-09-04 NOTE — Progress Notes (Addendum)
  Patient has arrived at [facility] room 12  -Telemetry neurology had recommended MRI Brain with contrast as well as in-house neurology consult neither of which are available at [facility] on [place] weekend  -I spoke with Dr Rory Percy and notified him that patient has arrived at [facility] reviewed with on-call Neurologist at [facility]  Dr. Amie [place]  -He Reviewed imaging studies with me -Further Neuro recommendations and neuro work-up will be deferred to neurology team/Dr Sherald Barge, MD

## 2019-09-05 ENCOUNTER — Inpatient Hospital Stay (HOSPITAL_COMMUNITY): Payer: Medicare Other

## 2019-09-05 DIAGNOSIS — G588 Other specified mononeuropathies: Secondary | ICD-10-CM

## 2019-09-05 DIAGNOSIS — R270 Ataxia, unspecified: Secondary | ICD-10-CM

## 2019-09-05 DIAGNOSIS — E039 Hypothyroidism, unspecified: Secondary | ICD-10-CM

## 2019-09-05 LAB — CBC WITH DIFFERENTIAL/PLATELET
Abs Immature Granulocytes: 0.18 10*3/uL — ABNORMAL HIGH (ref 0.00–0.07)
Basophils Absolute: 0 10*3/uL (ref 0.0–0.1)
Basophils Relative: 0 %
Eosinophils Absolute: 0 10*3/uL (ref 0.0–0.5)
Eosinophils Relative: 0 %
HCT: 32.5 % — ABNORMAL LOW (ref 36.0–46.0)
Hemoglobin: 9.7 g/dL — ABNORMAL LOW (ref 12.0–15.0)
Immature Granulocytes: 0 %
Lymphocytes Relative: 90 %
Lymphs Abs: 66.2 10*3/uL — ABNORMAL HIGH (ref 0.7–4.0)
MCH: 25.9 pg — ABNORMAL LOW (ref 26.0–34.0)
MCHC: 29.8 g/dL — ABNORMAL LOW (ref 30.0–36.0)
MCV: 86.9 fL (ref 80.0–100.0)
Monocytes Absolute: 1.1 10*3/uL — ABNORMAL HIGH (ref 0.1–1.0)
Monocytes Relative: 1 %
Neutro Abs: 6.6 10*3/uL (ref 1.7–7.7)
Neutrophils Relative %: 9 %
Platelets: 212 10*3/uL (ref 150–400)
RBC: 3.74 MIL/uL — ABNORMAL LOW (ref 3.87–5.11)
RDW: 15.7 % — ABNORMAL HIGH (ref 11.5–15.5)
WBC: 74.1 10*3/uL (ref 4.0–10.5)
nRBC: 0 % (ref 0.0–0.2)

## 2019-09-05 LAB — COMPREHENSIVE METABOLIC PANEL
ALT: 15 U/L (ref 0–44)
AST: 18 U/L (ref 15–41)
Albumin: 3.6 g/dL (ref 3.5–5.0)
Alkaline Phosphatase: 52 U/L (ref 38–126)
Anion gap: 8 (ref 5–15)
BUN: 10 mg/dL (ref 8–23)
CO2: 24 mmol/L (ref 22–32)
Calcium: 8.7 mg/dL — ABNORMAL LOW (ref 8.9–10.3)
Chloride: 108 mmol/L (ref 98–111)
Creatinine, Ser: 1.04 mg/dL — ABNORMAL HIGH (ref 0.44–1.00)
GFR calc Af Amer: >60 mL/min (ref >60)
GFR calc non Af Amer: 54 mL/min — ABNORMAL LOW (ref >60)
Glucose, Bld: 131 mg/dL — ABNORMAL HIGH (ref 70–99)
Potassium: 4.5 mmol/L (ref 3.5–5.1)
Sodium: 140 mmol/L (ref 135–145)
Total Bilirubin: 0.7 mg/dL (ref 0.3–1.2)
Total Protein: 5.9 g/dL — ABNORMAL LOW (ref 6.5–8.1)

## 2019-09-05 LAB — GLUCOSE, CAPILLARY
Glucose-Capillary: 128 mg/dL — ABNORMAL HIGH (ref 70–99)
Glucose-Capillary: 114 mg/dL — ABNORMAL HIGH (ref 70–99)
Glucose-Capillary: 157 mg/dL — ABNORMAL HIGH (ref 70–99)
Glucose-Capillary: 141 mg/dL — ABNORMAL HIGH (ref 70–99)

## 2019-09-05 LAB — MAGNESIUM: Magnesium: 2.2 mg/dL (ref 1.7–2.4)

## 2019-09-05 LAB — PHOSPHORUS: Phosphorus: 4.2 mg/dL (ref 2.5–4.6)

## 2019-09-05 MED ORDER — GADOBUTROL 1 MMOL/ML IV SOLN
8.0000 mL | Freq: Once | INTRAVENOUS | Status: AC | PRN
  Administered 2019-09-05: 01:00:00 8 mL via INTRAVENOUS

## 2019-09-05 MED ORDER — ENSURE ENLIVE PO LIQD
237.0000 mL | Freq: Two times a day (BID) | ORAL | Status: DC
  Administered 2019-09-05 – 2019-09-08 (×8): 237 mL via ORAL

## 2019-09-05 MED ORDER — SENNOSIDES-DOCUSATE SODIUM 8.6-50 MG PO TABS
1.0000 | ORAL_TABLET | Freq: Two times a day (BID) | ORAL | Status: DC
  Administered 2019-09-05 – 2019-09-08 (×8): 1 via ORAL
  Filled 2019-09-05 (×8): qty 1

## 2019-09-05 MED ORDER — PANTOPRAZOLE SODIUM 40 MG PO TBEC: 40.0000 mg | DELAYED_RELEASE_TABLET | Freq: Every day | ORAL | Status: DC

## 2019-09-05 NOTE — Procedures (Signed)
ELECTROENCEPHALOGRAM REPORT   Patient: Renee [name]       Room #: [id number] EEG No. ID: [id number] Age: 74 y.o.        Sex: female Referring Physician: Alfredia Ferguson Report Date:  09/05/2019        Interpreting Physician: Alexis Goodell  History: Renee [name] is an 74 y.o. female with right sided weakness  Medications:  Klonopin, Decadron, Cymbalta, Insulin, Keppra, Synthroid, Linzess, Latuda, Methadone, Crestor  Conditions of Recording:  This is a 21 channel routine scalp EEG performed with bipolar and monopolar montages arranged in accordance to the international 10/20 system of electrode placement. One channel was dedicated to EKG recording.  The patient is in the awake state.  Description:  The waking background activity consists of a low voltage, symmetrical, fairly well organized, 10 Hz alpha activity, seen from the parieto-occipital and posterior temporal regions.  Low voltage fast activity, poorly organized, is seen anteriorly and is at times superimposed on more posterior regions.  A mixture of theta and alpha rhythms are seen from the central and temporal regions.  Despite the frequently noted artifact over the left hemisphere, this activity is slower over the left parietal region where there are noted underlying slower rhythms that slow the background in this region The patient does not drowse or sleep. Hyperventilation was not performed.   Intermittent photic stimulation was performed and elicits a symmetrical driving response but fails to elicit any abnormalities.  IMPRESSION: This is an abnormal electroencephalogram secondary to slowing noted in the left parietal region consistent with the patient's radiological findings.     Alexis Goodell, MD Neurology [phone] 09/05/2019, 11:24 AM

## 2019-09-05 NOTE — Progress Notes (Signed)
EEG complete - results pending 

## 2019-09-05 NOTE — Evaluation (Signed)
Occupational Therapy Evaluation Patient Details Name: Renee [name] MRN: [id number] DOB: Mar 19, 1946 Today's Date: 09/05/2019    History of Present Illness 74 yo female presenting to [facility] ED after a fall reporting her right leg gave out. CT at [facility] showing L parietal lobe hypodensity. 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 PMHx of stage I CLL, neuralgia, HTN, CKD 3 and thyroid disease   Clinical Impression   PTA, pt was living with her son and was performing BADLs and using a RW for mobility. Pt reporting she has an aide who assist with ADLs and IADLs (coming Tues and Thurs) and has had several fall recently. Pt currently requiring Min A for UB ADLs, Mod-Max A +2 for LB ADLs, and Mod A +2 for functional mobility with RW. Pt presenting with decreased balance, strength, coordination of RUE/RLE, and safety. Pt highly motivated to return to PLOF. Pt will require further acute OT to increase occupational performance and facilitate safe dc. Recommend dc to CIR for further intensive OT to optimize safety, independence with ADLs, and return to PLOF as well as decrease caregiver burden.      Follow Up Recommendations  CIR;Supervision/Assistance - 24 hour    Equipment Recommendations  Other (comment)([place] to next venue)    Recommendations for Other Services Rehab consult;PT consult     Precautions / Restrictions Precautions Precautions: Fall Precaution Comments: Significant weakness at RLE Restrictions Weight Bearing Restrictions: No      Mobility Bed Mobility Overal bed mobility: Needs Assistance Bed Mobility: Supine to Sit     Supine to sit: Min assist     General bed mobility comments: Pt able to bring BLEs towards EOB and then using therpaist hand to pull into sitting position with Min A  Transfers Overall transfer level: Needs assistance Equipment used: Rolling walker (2 wheeled) Transfers: Sit to/from Stand Sit to Stand: Mod  assist;+2 physical assistance         General transfer comment: Mod A +2 to power up into standing and then main balance. Pt with tendency for right lateral lean    Balance Overall balance assessment: Needs assistance Sitting-balance support: No upper extremity supported;Feet supported Sitting balance-Leahy Scale: Fair Sitting balance - Comments: Able to maintain static sitting but requiring assistance to maintain balance during ADLs with tendency for right lateral lean Postural control: Right lateral lean Standing balance support: Bilateral upper extremity supported;During functional activity;No upper extremity supported Standing balance-Leahy Scale: Poor Standing balance comment: Reliant on physical A and UE support                           ADL either performed or assessed with clinical judgement   ADL Overall ADL's : Needs assistance/impaired Eating/Feeding: Set up;Sitting   Grooming: Oral care;Maximal assistance;Standing;Brushing hair;Sitting;Min guard Grooming Details (indicate cue type and reason): Pt requiring Max A for standing balance while at sink performing oral care. Pt with significant lean to right and required cues for correction. Decreased coorindation and grasp strength at RUE as she managed tooth brush and tooth paste. Able to brush her hair while seated in recliner Upper Body Bathing: Minimal assistance;Sitting   Lower Body Bathing: Moderate assistance;+2 for physical assistance;Sit to/from stand   Upper Body Dressing : Minimal assistance;Sitting   Lower Body Dressing: Moderate assistance;+2 for safety/equipment;Sit to/from stand Lower Body Dressing Details (indicate cue type and reason): Pt donning her left shoe with Mod A for postural  control and sitting balance (lateral leaning to right). Requriing assistance to bring right ankle to knee and then Mod A for sitting balance when donning right shoe.  Toilet Transfer: Moderate assistance;+2 for physical  assistance;Ambulation;BSC;RW Toilet Transfer Details (indicate cue type and reason): Mod A for power up and to maintain balance during mobility. Cues for hand placement at Frederic and Hygiene: Moderate assistance;+2 for physical assistance;Sit to/from stand Toileting - Clothing Manipulation Details (indicate cue type and reason): Pt able to perform peri care in standing with Mod A +2 for standing balance     Functional mobility during ADLs: Moderate assistance;+2 for physical assistance;Rolling walker General ADL Comments: Pt presenting with decreased balance, strength, and coorindation at RUE/RLE.      Vision Baseline Vision/History: Wears glasses Wears Glasses: At all times Patient Visual Report: No change from baseline Additional Comments: Pt denying any changes     Perception     Praxis      Pertinent Vitals/Pain Pain Assessment: Faces Faces Pain Scale: Hurts little more Pain Location: R knee Pain Descriptors / Indicators: Constant;Discomfort;Grimacing Pain Intervention(s): Monitored during session;Limited activity within patient's tolerance;Repositioned     Hand Dominance Right   Extremity/Trunk Assessment Upper Extremity Assessment Upper Extremity Assessment: RUE deficits/detail RUE Deficits / Details: Decreased strength and coordination as seen during ADLs, MMT, and finger to nose.  RUE Coordination: decreased fine motor;decreased gross motor   Lower Extremity Assessment Lower Extremity Assessment: Defer to PT evaluation   Cervical / Trunk Assessment Cervical / Trunk Assessment: Other exceptions Cervical / Trunk Exceptions: Tendency for right lateral lean   Communication Communication Communication: No difficulties   Cognition Arousal/Alertness: Awake/alert Behavior During Therapy: WFL for tasks assessed/performed;Anxious Overall Cognitive Status: Within Functional Limits for tasks assessed                                  General Comments: Pt verablizing fear of falling and anxious about performing mobility.    General Comments  VSS    Exercises     Shoulder Instructions      Home Living Family/patient expects to be discharged to:: Private residence Living Arrangements: Children(son) Available Help at Discharge: Family;Available PRN/intermittently Type of Home: House Home Access: Stairs to enter Entrance Stairs-Number of Steps: 2   Home Layout: Able to live on main level with bedroom/bathroom     Bathroom Shower/Tub: Teacher, early years/pre: Handicapped height     Home Equipment: Bedside commode;Walker - 2 wheels;Wheelchair - manual          Prior Functioning/Environment Level of Independence: Needs assistance  Gait / Transfers Assistance Needed: using walker, frequent falls ADL's / Homemaking Assistance Needed: Been doing sponge baths. Aide coming on Tu/Th to help with bathing, dressing, cooking/cleaning, laundry. Pt has required increased assist with ADL's over past week in setting of frequent falling.            OT Problem List: Decreased strength;Decreased range of motion;Decreased activity tolerance;Impaired balance (sitting and/or standing);Decreased coordination;Decreased safety awareness;Decreased knowledge of use of DME or AE;Decreased knowledge of precautions;Pain      OT Treatment/Interventions: Self-care/ADL training;Therapeutic exercise;Energy conservation;DME and/or AE instruction;Therapeutic activities;Patient/family education    OT Goals(Current goals can be found in the care plan section) Acute Rehab OT Goals Patient Stated Goal: Get strong again OT Goal Formulation: With patient Time For Goal Achievement: 09/19/19 Potential to Achieve Goals: Good  OT Frequency: Min 3X/week  Barriers to D/C:            Co-evaluation              AM-PAC OT "6 Clicks" Daily Activity     Outcome Measure Help from another person eating meals?: None Help  from another person taking care of personal grooming?: A Lot Help from another person toileting, which includes using toliet, bedpan, or urinal?: A Lot Help from another person bathing (including washing, rinsing, drying)?: A Lot Help from another person to put on and taking off regular upper body clothing?: A Little Help from another person to put on and taking off regular lower body clothing?: A Lot 6 Click Score: 15   End of Session Equipment Utilized During Treatment: Rolling walker;Gait belt Nurse Communication: Mobility status  Activity Tolerance: Patient tolerated treatment well Patient left: in chair;with call bell/phone within reach;with chair alarm set  OT Visit Diagnosis: Unsteadiness on feet (R26.81);Other abnormalities of gait and mobility (R26.89);Muscle weakness (generalized) (M62.81);Pain;History of falling (Z91.81);Repeated falls (R29.6);Ataxia, unspecified (R27.0) Pain - Right/Left: Right Pain - part of body: Knee                Time: [id number] OT Time Calculation (min): 37 min Charges:  OT General Charges $OT Visit: 1 Visit OT Evaluation $OT Eval Moderate Complexity: [place], OTR/L Acute Rehab Pager: [phone] Office: [place] 09/05/2019, 9:42 AM

## 2019-09-05 NOTE — Progress Notes (Signed)
Initial Nutrition Assessment  DOCUMENTATION CODES:   Not applicable  INTERVENTION:  -Continue Ensure Enlive po BID, each supplement provides 350 kcal and 20 grams of protein   NUTRITION DIAGNOSIS:   Increased nutrient needs related to chronic illness(I CCL, CKD3) as evidenced by estimated needs.    GOAL:   Patient will meet greater than or equal to 90% of their needs  MONITOR:   PO intake, Weight trends, Labs, Supplement acceptance  REASON FOR ASSESSMENT:   Malnutrition Screening Tool    ASSESSMENT:  RD working remotely.  74 year old female with past medical history of I CCL and neuralgia followed by [facility], HTN, CKD3, and thyroid disease who presented to ED with complaints of several week history of increasing paresthesia in her right foot and multiple falls due to poor balance. In ED, MRI/MRA revealed 2 cm cystic lesion in deep left parietal region with edema.  Patient admitted for cystic brain lesion.  1/2 - EEG: results pending  Per notes, lesion suggestive of mass verses cerebritis. Neurology recommeding IR for fluoroscopy guided spinal tap as well as CT of chest abdomen pelvis to assess for possible malignancy.  Patient on Heart Healthy diet, no recorded meals at this time for review. Will continue to monitor for po intake of meals and supplements. Patient is receiving Ensure twice daily per review of medications.   Current wt 83.5 kg (183.7 lbs) Weight history reviewed, noted 5.5 lb (2.9%) wt loss in the past month which is insignificant for time frame.   Medications reviewed and include: Decadron, SS novolog, Keppra, Linzess, Protonix, Miralax  Labs: CBGs 114-167 x 24 hrs, Cr 1.04 (H), WBC 74.1 (H), Hgb 9.7 (L)   NUTRITION - FOCUSED PHYSICAL EXAM: Unable to complete at this time. RD working remotely.    Diet Order:   Diet Order            Diet Heart Room service appropriate? Yes; Fluid consistency: Thin  Diet effective now               EDUCATION NEEDS:   No education needs have been identified at this time  Skin:  Skin Assessment: Reviewed RN Assessment  Last BM:  PTA  Height:   Ht Readings from Last 1 Encounters:  09/03/19 6\' 1"  (1.854 m)    Weight:   Wt Readings from Last 1 Encounters:  09/03/19 83.5 kg    Ideal Body Weight:  75 kg  BMI:  Body mass index is 24.28 kg/m.  Estimated Nutritional Needs:   Kcal:  1900-2100  Protein:  95-105  Fluid:  >/= 1.9 L/day   Lajuan Lines, RD, LDN Clinical Nutrition [facility] Telephone [phone] After Hours/Weekend Pager: [phone]

## 2019-09-05 NOTE — Progress Notes (Signed)
Rehab Admissions Coordinator Note:  Per PT/OT recommendation, patient was screened by Michel Santee for appropriateness for an Inpatient Acute Rehab Consult.  At this time, we are recommending Inpatient Rehab consult.  I will place a consult order so we may evaluate this pt on Monday.   Michel Santee 09/05/2019, 10:43 AM  I can be reached at [phone].

## 2019-09-05 NOTE — Evaluation (Signed)
Physical Therapy Evaluation Patient Details Name: Renee [name] MRN: [id number] DOB: Oct 17, 1945 Today's Date: 09/05/2019   History of Present Illness  74 yo female presenting to [facility] ED after a fall reporting her right leg gave out. CT at [facility] showing L parietal lobe hypodensity. 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 PMHx of stage I CLL, neuralgia, HTN, CKD 3 and thyroid disease  Clinical Impression  Pt admitted with above. Prior to admission, pt lives with her son and has been requiring increased assist with ADL's and mobility of late due to RLE weakness and frequent falling. On PT evaluation, pt presents with diminished RLE sensation distal to knee, decreased proprioception, RLE weakness, decreased coordination, and poor balance. Pt requiring two person moderate assist for ambulating 10 feet with use of walker. Presents as high fall risk based on history of falls and deficits listed above. Pt is an excellent candidate for CIR to address deficits and maximize functional independence.     Follow Up Recommendations CIR    Equipment Recommendations  Wheelchair (measurements PT);Wheelchair cushion (measurements PT)    Recommendations for Other Services Rehab consult     Precautions / Restrictions Precautions Precautions: Fall Precaution Comments: Significant weakness at RLE Restrictions Weight Bearing Restrictions: No      Mobility  Bed Mobility Overal bed mobility: Needs Assistance Bed Mobility: Supine to Sit     Supine to sit: Min assist     General bed mobility comments: Pt able to bring BLEs towards EOB and then using therpaist hand to pull into sitting position with Min A  Transfers Overall transfer level: Needs assistance Equipment used: Rolling walker (2 wheeled) Transfers: Sit to/from Stand Sit to Stand: Mod assist;+2 physical assistance         General transfer comment: Mod A +2 to power up into standing and  then main balance. Pt with tendency for right lateral lean  Ambulation/Gait Ambulation/Gait assistance: Mod assist;+2 physical assistance Gait Distance (Feet): 10 Feet Assistive device: Rolling walker (2 wheeled) Gait Pattern/deviations: Step-through pattern;Decreased stride length;Decreased stance time - right;Decreased dorsiflexion - right;Decreased weight shift to right Gait velocity: decreasecd Gait velocity interpretation: <1.8 ft/sec, indicate of risk for recurrent falls General Gait Details: Pt requiring modA + 2 for stability, heavy reliance through arms. Cues for sequencing, upright posture. Pt with erratic right step length/width, right lateral lean, decreased foot clearance, decreased heel strike at initial contact  Stairs            Wheelchair Mobility    Modified Rankin (Stroke Patients Only) Modified Rankin (Stroke Patients Only) Pre-Morbid Rankin Score: Moderately severe disability Modified Rankin: Moderately severe disability     Balance Overall balance assessment: Needs assistance Sitting-balance support: No upper extremity supported;Feet supported Sitting balance-Leahy Scale: Fair Sitting balance - Comments: Able to maintain static sitting but requiring assistance to maintain balance during ADLs with tendency for right lateral lean Postural control: Right lateral lean Standing balance support: Bilateral upper extremity supported;During functional activity;No upper extremity supported Standing balance-Leahy Scale: Poor Standing balance comment: Reliant on physical A and UE support                             Pertinent Vitals/Pain Pain Assessment: Faces Faces Pain Scale: Hurts little more Pain Location: R knee Pain Descriptors / Indicators: Constant;Discomfort;Grimacing Pain Intervention(s): Monitored during session;Repositioned    Home Living Family/patient expects to be discharged to:: Private residence  Living Arrangements:  Children(son) Available Help at Discharge: Family;Available PRN/intermittently Type of Home: House Home Access: Stairs to enter   Entrance Stairs-Number of Steps: 2 Home Layout: Able to live on main level with bedroom/bathroom Home Equipment: Bedside commode;Walker - 2 wheels;Wheelchair - manual      Prior Function Level of Independence: Needs assistance   Gait / Transfers Assistance Needed: using walker, frequent falls  ADL's / Homemaking Assistance Needed: Been doing sponge baths. Aide coming on Tu/Th to help with bathing, dressing, cooking/cleaning, laundry. Pt has required increased assist with ADL's over past week in setting of frequent falling.        Hand Dominance   Dominant Hand: Right    Extremity/Trunk Assessment   Upper Extremity Assessment Upper Extremity Assessment: RUE deficits/detail RUE Deficits / Details: Decreased strength and coordination as seen during ADLs, MMT, and finger to nose.  RUE Coordination: decreased fine motor;decreased gross motor    Lower Extremity Assessment Lower Extremity Assessment: RLE deficits/detail;LLE deficits/detail RLE Deficits / Details: Hip flexion 2/5, knee extension 4/5, ankle dorsiflexion 4/5. No sensation to light touch distal to knee RLE Sensation: decreased light touch;decreased proprioception RLE Coordination: decreased gross motor LLE Deficits / Details: WFL    Cervical / Trunk Assessment Cervical / Trunk Assessment: Other exceptions Cervical / Trunk Exceptions: Tendency for right lateral lean  Communication   Communication: No difficulties  Cognition Arousal/Alertness: Awake/alert Behavior During Therapy: WFL for tasks assessed/performed;Anxious Overall Cognitive Status: Within Functional Limits for tasks assessed                                 General Comments: Pt verbalizing fear of falling and anxious about performing mobility.       General Comments General comments (skin integrity, edema,  etc.): VSS    Exercises     Assessment/Plan    PT Assessment Patient needs continued PT services  PT Problem List Decreased strength;Decreased activity tolerance;Decreased balance;Decreased mobility;Decreased coordination;Impaired sensation       PT Treatment Interventions DME instruction;Gait training;Functional mobility training;Stair training;Therapeutic activities;Therapeutic exercise;Balance training;Patient/family education    PT Goals (Current goals can be found in the Care Plan section)  Acute Rehab PT Goals Patient Stated Goal: Get strong again PT Goal Formulation: With patient Time For Goal Achievement: 09/19/19 Potential to Achieve Goals: Good    Frequency Min 4X/week   Barriers to discharge        Co-evaluation PT/OT/SLP Co-Evaluation/Treatment: Yes Reason for Co-Treatment: For patient/therapist safety;To address functional/ADL transfers PT goals addressed during session: Mobility/safety with mobility OT goals addressed during session: ADL's and self-care       AM-PAC PT "6 Clicks" Mobility  Outcome Measure Help needed turning from your back to your side while in a flat bed without using bedrails?: A Little Help needed moving from lying on your back to sitting on the side of a flat bed without using bedrails?: A Little Help needed moving to and from a bed to a chair (including a wheelchair)?: A Lot Help needed standing up from a chair using your arms (e.g., wheelchair or bedside chair)?: A Lot Help needed to walk in hospital room?: A Lot Help needed climbing 3-5 steps with a railing? : Total 6 Click Score: 13    End of Session Equipment Utilized During Treatment: Gait belt Activity Tolerance: Patient tolerated treatment well Patient left: in chair;with call bell/phone within reach;with chair alarm set Nurse Communication: Mobility status PT Visit  Diagnosis: Unsteadiness on feet (R26.81);Other abnormalities of gait and mobility (R26.89);History of falling  (Z91.81);Difficulty in walking, not elsewhere classified (R26.2)    Time: [id number] PT Time Calculation (min) (ACUTE ONLY): 36 min   Charges:   PT Evaluation $PT Eval Moderate Complexity: 1 Mod          Ellamae Sia, [place], DPT Acute Rehabilitation Services Pager [phone] Office [phone]   Willy Eddy 09/05/2019, 9:53 AM

## 2019-09-05 NOTE — Progress Notes (Signed)
Attempted to transfer pt from bedside commode to bed. Pt's right leg gave out and assisted pt with sliding down to floor. Pt received no injuries, vital signs are WDL, and pt stated that she had no pain. MD aware.

## 2019-09-05 NOTE — Progress Notes (Signed)
Neurology Progress Note   S:// Seen and examined. No new complaints No improvement in leg weakness Spoke with the daughter-[name] [name] -over the phone and and then in person when she came to the hospital to visit her mother  O:// Current vital signs: BP 110/66 (BP Location: Right Arm)   Pulse 67   Temp 97.9 F (36.6 C) (Oral)   Resp 16   Ht 6\' 1"  (1.854 m)   Wt 83.5 kg   SpO2 97%   BMI 24.28 kg/m  Vital signs in last 24 hours: Temp:  [97.7 F (36.5 C)-98.3 F (36.8 C)] 97.9 F (36.6 C) (01/02 0804) Pulse Rate:  [64-95] 67 (01/02 0804) Resp:  [12-21] 16 (01/02 0804) BP: (94-130)/(47-71) 110/66 (01/02 0804) SpO2:  [91 %-98 %] 97 % (01/02 0804) General: Awake alert oriented x3 HEENT: Normocephalic atraumatic CVs: Regular rate rhythm Respiratory: Breathing normally saturating well on room air Extremities warm well perfused without edema Neurological exam Awake alert oriented x3 Speech is nondysarthric No evidence of aphasia Cranial: Pupils equal round react light, extraocular movements intact, visual field full, facial sensation intact, subtle right nasolabial fold flattening, mildly decreased auditory acuity, midline tongue and palate. Motor exam: Right upper extremity 4+/5.  Has pronator drift.  Right lower extremity barely 3/5 at the hip and knee and 3-4/5 at plantar and dorsiflexion. Sensory exam: Intact to light touch DTRs: Brisk on the right side with upgoing right toe. No gross ataxia  Medications  Current Facility-Administered Medications:  .  0.9 %  sodium chloride infusion, , Intravenous, Continuous, Norins, Heinz Knuckles, MD, Last Rate: 10 mL/hr at 09/04/19 1358, New Bag at 09/04/19 1358 .  acetaminophen (TYLENOL) tablet 650 mg, 650 mg, Oral, Q4H PRN **OR** acetaminophen (TYLENOL) 160 MG/5ML solution 650 mg, 650 mg, Per Tube, Q4H PRN **OR** acetaminophen (TYLENOL) suppository 650 mg, 650 mg, Rectal, Q4H PRN, Norins, Heinz Knuckles, MD .  clonazePAM Bobbye [place])  tablet 0.5 mg, 0.5 mg, Oral, BID, Norins, Heinz Knuckles, MD, 0.5 mg at 09/04/19 2133 .  dexamethasone (DECADRON) tablet 4 mg, 4 mg, Oral, Q8H, Emokpae, Courage, MD, 4 mg at 09/05/19 0615 .  DULoxetine (CYMBALTA) DR capsule 60 mg, 60 mg, Oral, Daily, Norins, Heinz Knuckles, MD, 60 mg at 09/05/19 [id number] .  feeding supplement (ENSURE ENLIVE) (ENSURE ENLIVE) liquid 237 mL, 237 mL, Oral, BID BM, Sheikh, Omair Latif, DO, 237 mL at 09/05/19 0940 .  insulin aspart (novoLOG) injection 0-5 Units, 0-5 Units, Subcutaneous, QHS, Emokpae, Courage, MD .  insulin aspart (novoLOG) injection 0-6 Units, 0-6 Units, Subcutaneous, TID WC, Emokpae, Courage, MD .  lactulose (CHRONULAC) 10 GM/15ML solution 10 g, 10 g, Oral, BID, Emokpae, Courage, MD, 10 g at 09/05/19 0938 .  levETIRAcetam (KEPPRA) tablet 250 mg, 250 mg, Oral, BID, Norins, Heinz Knuckles, MD, 250 mg at 09/05/19 [id number] .  levothyroxine (SYNTHROID) tablet 100 mcg, 100 mcg, Oral, QAC breakfast, Norins, Heinz Knuckles, MD, 100 mcg at 09/05/19 [id number] .  linaclotide (LINZESS) capsule 290 mcg, 290 mcg, Oral, QAC breakfast, Norins, Heinz Knuckles, MD, 290 mcg at 09/05/19 1039 .  lurasidone (LATUDA) tablet 40 mg, 40 mg, Oral, QHS, Norins, Heinz Knuckles, MD, 40 mg at 09/04/19 2217 .  methadone (DOLOPHINE) tablet 15 mg, 15 mg, Oral, Daily, 15 mg at 09/05/19 0938 **AND** methadone (DOLOPHINE) tablet 10 mg, 10 mg, Oral, q1800, Norins, Heinz Knuckles, MD, 10 mg at 09/04/19 1812 .  pantoprazole (PROTONIX) EC tablet 40 mg, 40 mg, Oral, BID, Norins, Heinz Knuckles, MD, 40 mg at 09/05/19  [id number] .  polyethylene glycol (MIRALAX / GLYCOLAX) packet 17 g, 17 g, Oral, Daily, Norins, Heinz Knuckles, MD, 17 g at 09/05/19 0939 .  rosuvastatin (CRESTOR) tablet 20 mg, 20 mg, Oral, q1800, Norins, Heinz Knuckles, MD, 20 mg at 09/04/19 1812 Labs CBC    Component Value Date/Time   WBC 74.1 (HH) 09/05/2019 0757   RBC 3.74 (L) 09/05/2019 0757   HGB 9.7 (L) 09/05/2019 0757   HCT 32.5 (L) 09/05/2019 0757   PLT 212 09/05/2019 0757   MCV 86.9  09/05/2019 0757   MCH 25.9 (L) 09/05/2019 0757   MCHC 29.8 (L) 09/05/2019 0757   RDW 15.7 (H) 09/05/2019 0757   LYMPHSABS 66.2 (H) 09/05/2019 0757   MONOABS 1.1 (H) 09/05/2019 0757   EOSABS 0.0 09/05/2019 0757   BASOSABS 0.0 09/05/2019 0757    CMP     Component Value Date/Time   NA 140 09/05/2019 0757   K 4.5 09/05/2019 0757   CL 108 09/05/2019 0757   CO2 24 09/05/2019 0757   GLUCOSE 131 (H) 09/05/2019 0757   BUN 10 09/05/2019 0757   CREATININE 1.04 (H) 09/05/2019 0757   CALCIUM 8.7 (L) 09/05/2019 0757   PROT 5.9 (L) 09/05/2019 0757   ALBUMIN 3.6 09/05/2019 0757   AST 18 09/05/2019 0757   ALT 15 09/05/2019 0757   ALKPHOS 52 09/05/2019 0757   BILITOT 0.7 09/05/2019 0757   GFRNONAA 54 (L) 09/05/2019 0757   GFRAA >60 09/05/2019 0757   Imaging I have reviewed images in epic and the results pertinent to this consultation are: CT-scan of the brain-Area of decreased attenuation is noted in the deep white matter of the left parietal lobe near the vertex  MRI examination of the brain-without contrast Findings not suggestive of ischemic infarction. 2 cm cystic lesion in the left parietal deep white matter with regional vasogenic edema and a possible wall showing some restricted diffusion. Differential diagnosis is tumor versus cerebritis. Postcontrast imaging would be suggested for more complete evaluation.  MRI of the brain with contrast IMPRESSION: 1. 2 cm cystic lesion involving the subcortical white matter of the left parietal lobe demonstrates no intrinsic enhancement. 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 Cerebritis or subacute infarction felt to be unlikely as these with expected to demonstrate associated enhancement. Short interval follow-up MRI in 3 months to document stability is recommended. 2. No other abnormal enhancement elsewhere within the brain.  EEG with slowing in the left parietal region consistent with the lesion seen on  MRI.  Assessment:  74 year old woman with past medical history of CLL, hypertension, CKD 3 and thyroid disease along with neuropathy presented with complaints of frequent falls for the past 2 to 3 weeks.  She has been progressively getting weaker in her right leg.  Initial evaluation at the outside hospital with a CAT scan was concerning for a stroke in the left ACA territory but further work-up with an MRI of the brain with and without contrast is concerning for that lesion being suggestive of mass versus cerebritis.  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  Recommendations: -She had the dose of Lovenox for DVT prophylaxis last night.  Hold the next dose of Lovenox.  I requested Dr. Clovis Riley- IR- to do a fluoroscopy guided spinal tap. -Send out for glucose, protein, cell count, Gram stain, cytology, flow cytometry, and also JC virus. -If the CSF results remain unremarkable for infection, and do  not point towards a malignant or other cause, the only way to obtain a definitive diagnosis would be a tissue sampling with a biopsy.  I have discussed this possibility with the family. -There is no evidence of underlying seizures.  No need for antiepileptics at this time. -Continue dexamethasone for now.  Watch sugars carefully while on steroids. We will follow with you  Discussed with the daughter at bedside in detail along with the patient.  Answered all questions.  -- Amie Portland, MD [facility] Pager: [phone] If 7pm to 7am, please call on call as listed on AMION.

## 2019-09-05 NOTE — Progress Notes (Signed)
PROGRESS NOTE    Renee [name]  [id number] DOB: [birth year] DOA: 09/03/2019 PCP: Ephriam Jenkins E   Brief Narrative:  HPI per Dr. Adella Hare on 09/03/2019 Renee [name] is a 74 y.o. female with medical history significant of stage I CLL, and a neuralgia followed at the [facility] pain clinic, hypertension, CKD 3, thyroid disease.  Patient reports that several weeks ago she started to develop increasing paresthesia in her right foot.  She does have a history of peripheral neuropathy to which she attributed her symptoms.  Over the intervening period of time she had progressive paresthesia in the right leg and increasing weakness.  She reports that she has had multiple falls due to poor balance.  She has had no loss of vision.  Had no severe headache.  She has had no tremor.  She had no fevers or chills.  Because of her progressive weakness and falling she presents to the [place] emergency department for evaluation  ED Course: Patient is hemodynamically stable in the emergency department.  Her evaluation did reveal some right-sided weakness and ataxia.  CT scan of the head revealed a left parietal abnormality.  MRI/MRA reveals 2 cm cystic lesion deep left parietal region with edema, no vascular occlusion. She is referred to [facility] for admission work-up for cystic brain lesion.  **Interim History  Patient was transferred to [facility] for further evaluation and neurology was consulted.  Neurology evaluated and recommended MRI and they will discuss with radiology for further interventions and work-up.  Patient continues to be weak on her right side and has fallen multiple times and now is complaining that her right arm is being weak.  States that she has been dragging her foot now.  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 Active Problems:   Chronic depression   Chronic pain   Hypothyroidism   Pudendal neuralgia   CLL (chronic  lymphocytic leukemia) (HCC)   History of breast cancer   Chronic renal disease, stage 3, moderately decreased glomerular filtration rate between 30-59 mL/min/1.73 square meter   GERD (gastroesophageal reflux disease)   Right sided weakness  Recurrent falls with right-sided weakness -With cranial imaging studies showing 2 CM Left parietal lobe lesion--- tumor versus cerebritis -Worked up for CVA and neurology does not feel that this is an acute CVA -Transferred to [place]  for further neurology and possibly neurosurgical and oncology consults; the neurologist is on board and there weighing in their opinions and will let me know if they feel that neurosurgical and oncology evaluation is necessary -Telemetry neurologist advised Keppra 250 twice daily for seizure prophylaxis, Decadron 4 mg every 8 hours and will continue for now -MRA Angio Head and Brain w/o Contrast showed "Normal MR angiography. Findings not suggestive of ischemic infarction. 2 cm cystic lesion in the left parietal deep white matter with regional vasogenic edema and a possible wall showing some restricted diffusion. Differential diagnosis is tumor versus cerebritis. Postcontrast imaging would be suggested for more complete evaluation." -MRI Brain with Contrastra done and showed "2 cm cystic lesion involving the subcortical white matter of the left parietal lobe demonstrates no intrinsic enhancement. 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 Cerebritis or subacute infarction felt to be unlikely as these with expected to demonstrate associated enhancement. Short interval follow-up MRI in 3 months to document stability is recommended. No other abnormal enhancement elsewhere within the brain." -Continue with very sensitive NovoLog  sliding scale insulin while patient is on steroids; CBGs have been ranging from 120-167 -EEG Done and showed "This is an abnormal electroencephalogram secondary to slowing noted in the  left parietal region consistent with the patient's radiological findings" -PT/OT recommending CIR -Neurology Consulted for further Recommendations recommended brain MRI as above -They also recommending CT of the chest abdomen pelvis to assess for possible primary malignancy the patient does have CLL -Per my discussion with the neurologist Dr. Rory Percy he will concur with radiology and will make further recommendations as this is an atypical presentation and they may consider a spinal tap and possible biopsy  Stage I CLL -WBC over 100k and is now improved and this morning was 74.1 -She sees hematology Dr. Delton Coombes at [place] WBC is around 80,000 per patient -Likely to elevate further in the setting of steroid demargination with dexamethasone 4 mg p.o. every 8h  Peripheral Neuropathy/Pudendal Neuralgia/Chronic Pain -Continue home pain regimen including Methadone 15 mg in the AM and 10 mg at 1800 and with Morphine 15 mg po q4hprn Severe Pain   History of Breast Cancer -Apparently in remission since 1999.   -Intracranial imaging does not suggest metastatic breast cancer  Hypothyroidism -Check TSH in a.m. -C/w Levothyroxine 100 mcg daily  Depressive Disorder with Anxiety  -Stable -Continue Lurasidone 40 mg p.o. daily and continue Duloxetine 60 mg daily -C/w Clonazepam 0.5 mg twice daily  CKD III -Stable at this time, renal function appears to be at baseline, with a creatinine of 1.0 and GFR of 53 -BUN/Cr is now 10/1.04 -Avoid nephrotoxic medications, contrast dyes, hypotension and renally adjust medications -Continue monitor and trend renal function  -Repeat CMP in a.m.  HLD -Lipid Panel Showed total cholesterol/HDL ratio of 3.1, cholesterol level of 110, HDL 35, LDL 52, triglycerides of 117, and VLDL of 23 -C/w Rosuvastatin 20 mg po qDaily   IBS/Constipation Concerns -Continue Lactulose 10 g twice daily along with Linaclotide 290 mcg po Daily before  breakfast -MiraLAX as ordered with 17 grams po Daily  -Resume Home Senna-Docusate 1 tab p.o. twice daily  Normocytic Anemia -Patient had a hemoglobin/hematocrit of 9.7/32.5 this morning with an MCV of 86.9  -Check anemia panel in the a.m. -Continue to monitor for signs and symptoms of bleeding; currently no overt bleeding noted -Repeat CBC in the a.m.  GERD -C/w Pantoprazole 40 mg po BID   GOC: DNR, poA  DVT prophylaxis: SCDs Code Status: DO NOT RESUSCITATE Family Communication: No family present at bedside Disposition Plan: Pending further Neurology Evalaution  Consultants:   TeleNeurology  Neurology    Procedures: MRI  EEG Conditions of Recording:  This is a 21 channel routine scalp EEG performed with bipolar and monopolar montages arranged in accordance to the international 10/20 system of electrode placement. One channel was dedicated to EKG recording.  The patient is in the awake state.  Description:  The waking background activity consists of a low voltage, symmetrical, fairly well organized, 10 Hz alpha activity, seen from the parieto-occipital and posterior temporal regions.  Low voltage fast activity, poorly organized, is seen anteriorly and is at times superimposed on more posterior regions.  A mixture of theta and alpha rhythms are seen from the central and temporal regions.  Despite the frequently noted artifact over the left hemisphere, this activity is slower over the left parietal region where there are noted underlying slower rhythms that slow the background in this region The patient does not drowse or sleep. Hyperventilation was not performed.  Intermittent photic stimulation was performed and elicits a symmetrical driving response but fails to elicit any abnormalities.  IMPRESSION: This is an abnormal electroencephalogram secondary to slowing noted in the left parietal region consistent with the patient's radiological findings.    ECHOCARDIOGRAM IMPRESSIONS    1. Left ventricular ejection fraction, by visual estimation, is 70 to 75%. The left ventricle has hyperdynamic function. There is no left ventricular hypertrophy.  2. Left ventricular diastolic parameters are indeterminate.  3. The left ventricle has no regional wall motion abnormalities.  4. Global right ventricle has normal systolic function.The right ventricular size is normal. No increase in right ventricular wall thickness.  5. Left atrial size was normal.  6. Right atrial size was normal.  7. Presence of pericardial fat pad.  8. The mitral valve is grossly normal. No evidence of mitral valve regurgitation.  9. The tricuspid valve is grossly normal. 10. The aortic valve is tricuspid. Aortic valve regurgitation is not visualized. 11. The pulmonic valve was grossly normal. Pulmonic valve regurgitation is trivial. 12. TR signal is inadequate for assessing pulmonary artery systolic pressure. 13. The inferior vena cava is normal in size with greater than 50% respiratory variability, suggesting right atrial pressure of 3 mmHg.  FINDINGS  Left Ventricle: Left ventricular ejection fraction, by visual estimation, is 70 to 75%. The left ventricle has hyperdynamic function. The left ventricle has no regional wall motion abnormalities. There is no left ventricular hypertrophy. Left  ventricular diastolic parameters are indeterminate.  Right Ventricle: The right ventricular size is normal. No increase in right ventricular wall thickness. Global RV systolic function is has normal systolic function.  Left Atrium: Left atrial size was normal in size.  Right Atrium: Right atrial size was normal in size  Pericardium: There is no evidence of pericardial effusion. Presence of pericardial fat pad.  Mitral Valve: The mitral valve is grossly normal. No evidence of mitral valve regurgitation.  Tricuspid Valve: The tricuspid valve is grossly normal. Tricuspid  valve regurgitation is trivial.  Aortic Valve: The aortic valve is tricuspid. Aortic valve regurgitation is not visualized.  Pulmonic Valve: The pulmonic valve was grossly normal. Pulmonic valve regurgitation is trivial. Pulmonic regurgitation is trivial.  Aorta: The aortic root is normal in size and structure.  Venous: The inferior vena cava is normal in size with greater than 50% respiratory variability, suggesting right atrial pressure of 3 mmHg.  IAS/Shunts: No atrial level shunt detected by color flow Doppler.    LEFT VENTRICLE PLAX 2D LVIDd:         3.75 cm  Diastology LVIDs:         2.13 cm  LV e' lateral:   8.92 cm/s LV PW:         0.98 cm  LV E/e' lateral: 8.7 LV IVS:        0.96 cm  LV e' medial:    11.50 cm/s LVOT diam:     2.20 cm  LV E/e' medial:  6.8 LV SV:         45 ml LV SV Index:   21.71 LVOT Area:     3.80 cm    RIGHT VENTRICLE RV S prime:     12.40 cm/s TAPSE (M-mode): 2.2 cm  LEFT ATRIUM             Index       RIGHT ATRIUM           Index LA diam:        2.60  cm 1.25 cm/m  RA Area:     11.20 cm LA Vol (A2C):   44.3 ml 21.33 ml/m RA Volume:   27.00 ml  13.00 ml/m LA Vol (A4C):   32.8 ml 15.79 ml/m LA Biplane Vol: 40.5 ml 19.50 ml/m    AORTA Ao Root diam: 2.80 cm  MITRAL VALVE MV Area (PHT): 3.37 cm             SHUNTS MV PHT:        65.25 msec           Systemic Diam: 2.20 cm MV Decel Time: 225 msec MV E velocity: 77.70 cm/s 103 cm/s MV A velocity: 83.20 cm/s 70.3 cm/s MV E/A ratio:  0.93       1.5    Antimicrobials:  Anti-infectives (From admission, onward)   None     Subjective: Seen and examined at bedside and she was complaining of right-sided weakness and now states that she is having weakness in her right arm as well.  No chest pain, lightheadedness or dizziness.  No shortness of breath.  Has chronic pain and states that she has pain in her rectum usually.  No other concerns or complaints at this  time.  Objective: Vitals:   09/05/19 0002 09/05/19 0106 09/05/19 0415 09/05/19 0804  BP: (!) 101/47 (!) 94/59 99/71 110/66  Pulse: 64 68 74 67  Resp: 16  16 16   Temp: 97.9 F (36.6 C)  97.7 F (36.5 C) 97.9 F (36.6 C)  TempSrc: Oral  Oral Oral  SpO2: 95%  96% 97%  Weight:      Height:        Intake/Output Summary (Last 24 hours) at 09/05/2019 1128 Last data filed at 09/05/2019 [id number] Gross per 24 hour  Intake 120 ml  Output 400 ml  Net -280 ml   Filed Weights   09/03/19 1302  Weight: 83.5 kg   Examination: Physical Exam:  Constitutional: WN/WD elderly Caucasian female currently sitting up in a chair bedside in no acute distress appears calm Eyes: Lids and conjunctivae normal, sclerae anicteric  ENMT: External Ears, Nose appear normal. Grossly normal hearing. Mucous membranes are moist.  Neck: Appears normal, supple, no cervical masses, normal ROM, no appreciable thyromegaly; no JVD Respiratory: Diminished to auscultation bilaterally, no wheezing, rales, rhonchi or crackles. Normal respiratory effort and patient is not tachypenic. No accessory muscle use.  Cardiovascular: RRR, no murmurs / rubs / gallops. S1 and S2 auscultated. No extremity edema Abdomen: Soft, non-tender, mildly distended. Bowel sounds positive x4.  GU: Deferred. Musculoskeletal: No clubbing / cyanosis of digits/nails. No joint deformity upper and lower extremities. Diminished strength on right Side with Right Leg being weak Skin: No rashes, lesions, ulcers on a limited skin evaluation. No induration; Warm and dry.  Neurologic: CN 2-12 grossly intact with no focal deficits. Romberg sign and cerebellar reflexes not assessed.  Psychiatric: Normal judgment and insight. Alert and oriented x 3. Normal mood and appropriate affect.   Data Reviewed: I have personally reviewed following labs and imaging studies  CBC: Recent Labs  Lab 09/03/19 1716 09/05/19 0757  WBC 103.5* 74.1*  NEUTROABS 5.3 6.6  HGB 11.2*  9.7*  HCT 39.3 32.5*  MCV 89.5 86.9  PLT 266 [id number]   Basic Metabolic Panel: Recent Labs  Lab 09/03/19 1716 09/05/19 0757  NA 140 140  K 4.0 4.5  CL 103 108  CO2 24 24  GLUCOSE 103* 131*  BUN 11 10  CREATININE 1.05* 1.04*  CALCIUM 9.2 8.7*  MG  --  2.2  PHOS  --  4.2   GFR: Estimated Creatinine Clearance: 59.1 mL/min (A) (by C-G formula based on SCr of 1.04 mg/dL (H)). Liver Function Tests: Recent Labs  Lab 09/03/19 1716 09/05/19 0757  AST 24 18  ALT 18 15  ALKPHOS 74 52  BILITOT 1.2 0.7  PROT 7.3 5.9*  ALBUMIN 4.4 3.6   No results for input(s): LIPASE, AMYLASE in the last 168 hours. No results for input(s): AMMONIA in the last 168 hours. Coagulation Profile: Recent Labs  Lab 09/03/19 1716  INR 0.9   Cardiac Enzymes: No results for input(s): CKTOTAL, CKMB, CKMBINDEX, TROPONINI in the last 168 hours. BNP (last 3 results) No results for input(s): PROBNP in the last 8760 hours. HbA1C: Recent Labs    09/04/19 0416  HGBA1C 5.4   CBG: Recent Labs  Lab 09/04/19 1806 09/04/19 2127 09/05/19 0607  GLUCAP 138* 167* 128*   Lipid Profile: Recent Labs    09/04/19 0416  CHOL 110  HDL 35*  LDLCALC 52  TRIG 117  CHOLHDL 3.1   Thyroid Function Tests: No results for input(s): TSH, T4TOTAL, FREET4, T3FREE, THYROIDAB in the last 72 hours. Anemia Panel: No results for input(s): VITAMINB12, FOLATE, FERRITIN, TIBC, IRON, RETICCTPCT in the last 72 hours. Sepsis Labs: No results for input(s): PROCALCITON, LATICACIDVEN in the last 168 hours.  Recent Results (from the past 240 hour(s))  SARS CORONAVIRUS 2 (TAT 6-24 HRS) Nasopharyngeal Nasopharyngeal Swab     Status: None   Collection Time: 09/03/19  9:04 PM   Specimen: Nasopharyngeal Swab  Result Value Ref Range Status   SARS Coronavirus 2 NEGATIVE NEGATIVE Final    Comment: (NOTE) SARS-CoV-2 target nucleic acids are NOT DETECTED. The SARS-CoV-2 RNA is generally detectable in upper and lower respiratory  specimens during the acute phase of infection. Negative results do not preclude SARS-CoV-2 infection, do not rule out co-infections with other pathogens, and should not be used as the sole basis for treatment or other patient management decisions. Negative results must be combined with clinical observations, patient history, and epidemiological information. The expected result is Negative. Fact Sheet for Patients: SugarRoll.be Fact Sheet for Healthcare Providers: https://www.woods-mathews.com/ This test is not yet approved or cleared by the [place] FDA and  has been authorized for detection and/or diagnosis of SARS-CoV-2 by FDA under an Emergency Use Authorization (EUA). This EUA will remain  in effect (meaning this test can be used) for the duration of the COVID-19 declaration under Section 56 4(b)(1) of the Act, 21 U.S.C. section 360bbb-3(b)(1), unless the authorization is terminated or revoked sooner. Performed at [facility], [place] [address] [place], [place] [address]    Radiology Studies: DG Chest 2 View  Result Date: 09/03/2019 CLINICAL DATA:  Fall EXAM: CHEST - 2 VIEW COMPARISON:  CT 05/06/2019 FINDINGS: No acute airspace disease, pleural effusion or pneumothorax. Normal heart size. Aortic atherosclerosis. Nodular opacities at the bilateral hila, felt to correspond to adenopathy noted on previous CT. Moderate hiatal hernia. No pneumothorax. Degenerative changes of the spine. IMPRESSION: 1. No active cardiopulmonary disease. 2. Nodular opacities at the hila, felt to correspond to adenopathy noted on previous CT. 3. Moderate hiatal hernia. Electronically Signed   By: Donavan Foil M.D.   On: 09/03/2019 20:06   CT Head Wo Contrast  Result Date: 09/03/2019 CLINICAL DATA:  Recent fall with right leg weakness, initial encounter EXAM: CT HEAD WITHOUT CONTRAST TECHNIQUE: Contiguous axial images were obtained  from the base of the skull  through the vertex without intravenous contrast. COMPARISON:  None. FINDINGS: Brain: Area of decreased attenuation is noted in the deep white matter of the left parietal lobe near the vertex consistent with subacute ischemia. No findings to suggest acute infarct are seen. No findings to suggest acute hemorrhage or space-occupying mass lesion are noted. Vascular: No hyperdense vessel or unexpected calcification. Skull: Normal. Negative for fracture or focal lesion. Sinuses/Orbits: No acute finding. Other: None. IMPRESSION: Area consistent with prior ischemia within the deep white matter of the left parietal lobe as described. No acute infarct is noted. Electronically Signed   By: Inez Catalina M.D.   On: 09/03/2019 14:50   MR ANGIO HEAD WO CONTRAST  Result Date: 09/03/2019 CLINICAL DATA:  [place] today with right leg weakness. EXAM: MRI HEAD WITHOUT CONTRAST MRA HEAD WITHOUT CONTRAST TECHNIQUE: Multiplanar, multiecho pulse sequences of the brain and surrounding structures were obtained without intravenous contrast. Angiographic images of the head were obtained using MRA technique without contrast. COMPARISON:  Head CT same day FINDINGS: MRI HEAD FINDINGS Brain: The brainstem and cerebellum are normal. Cerebral hemispheres show age related volume loss with some frontal predominance. Right cerebral hemisphere shows minimal small vessel change of the white matter. Left cerebral hemisphere shows mild small vessel change of the white matter. At the left parietal vertex, within the deep white matter, there is a 2 cm in diameter cystic lesion with surrounding low level restricted diffusion and some adjacent vasogenic edema. This does not look like a typical ischemic infarction. No evidence of susceptibility artifact to suggest resolving hematoma. The differential diagnosis includes tumor and cerebritis, both infectious inflammatory and autoimmune. This includes progressive multifocal leukoencephalopathy. I would suggest  postcontrast imaging to evaluate this more completely. Vascular: Major vessels at the base of the brain show flow. Skull and upper cervical spine: Negative Sinuses/Orbits: Clear/normal Other: None MRA HEAD FINDINGS Both internal carotid arteries are widely patent into the brain. No siphon stenosis. The anterior and middle cerebral vessels are patent without proximal stenosis, aneurysm or vascular malformation. Both vertebral arteries are widely patent to the basilar. No basilar stenosis. Posterior circulation branch vessels appear normal. IMPRESSION: Normal MR angiography. Findings not suggestive of ischemic infarction. 2 cm cystic lesion in the left parietal deep white matter with regional vasogenic edema and a possible wall showing some restricted diffusion. Differential diagnosis is tumor versus cerebritis. Postcontrast imaging would be suggested for more complete evaluation. Electronically Signed   By: Nelson Chimes M.D.   On: 09/03/2019 20:10   MR BRAIN WO CONTRAST  Result Date: 09/03/2019 CLINICAL DATA:  [place] today with right leg weakness. EXAM: MRI HEAD WITHOUT CONTRAST MRA HEAD WITHOUT CONTRAST TECHNIQUE: Multiplanar, multiecho pulse sequences of the brain and surrounding structures were obtained without intravenous contrast. Angiographic images of the head were obtained using MRA technique without contrast. COMPARISON:  Head CT same day FINDINGS: MRI HEAD FINDINGS Brain: The brainstem and cerebellum are normal. Cerebral hemispheres show age related volume loss with some frontal predominance. Right cerebral hemisphere shows minimal small vessel change of the white matter. Left cerebral hemisphere shows mild small vessel change of the white matter. At the left parietal vertex, within the deep white matter, there is a 2 cm in diameter cystic lesion with surrounding low level restricted diffusion and some adjacent vasogenic edema. This does not look like a typical ischemic infarction. No evidence of  susceptibility artifact to suggest resolving hematoma. The differential diagnosis includes tumor and cerebritis,  both infectious inflammatory and autoimmune. This includes progressive multifocal leukoencephalopathy. I would suggest postcontrast imaging to evaluate this more completely. Vascular: Major vessels at the base of the brain show flow. Skull and upper cervical spine: Negative Sinuses/Orbits: Clear/normal Other: None MRA HEAD FINDINGS Both internal carotid arteries are widely patent into the brain. No siphon stenosis. The anterior and middle cerebral vessels are patent without proximal stenosis, aneurysm or vascular malformation. Both vertebral arteries are widely patent to the basilar. No basilar stenosis. Posterior circulation branch vessels appear normal. IMPRESSION: Normal MR angiography. Findings not suggestive of ischemic infarction. 2 cm cystic lesion in the left parietal deep white matter with regional vasogenic edema and a possible wall showing some restricted diffusion. Differential diagnosis is tumor versus cerebritis. Postcontrast imaging would be suggested for more complete evaluation. Electronically Signed   By: Nelson Chimes M.D.   On: 09/03/2019 20:10   MR BRAIN W CONTRAST  Result Date: 09/05/2019 CLINICAL DATA:  Follow-up examination for signal abnormality at the left parietal lobe. EXAM: MRI HEAD WITH CONTRAST TECHNIQUE: Multiplanar, multiecho pulse sequences of the brain and surrounding structures were obtained with intravenous contrast. CONTRAST:  57mL GADAVIST GADOBUTROL 1 MMOL/ML IV SOLN COMPARISON:  Prior brain MRI from 09/03/2019. FINDINGS: Brain: Postcontrast imaging only was performed. Following contrast administration, the previously identified 2 cm cystic lesion involving the subcortical white matter of the mesial left parietal lobe does not demonstrate any significant intrinsic enhancement. No other abnormal enhancement seen elsewhere within the brain. Vascular: Normal  intravascular enhancement seen throughout the major intracranial vasculature. Skull and upper cervical spine: Craniocervical junction within normal limits. Bone marrow signal intensity normal. No scalp soft tissue abnormality. Sinuses/Orbits: Globes and orbital soft tissues within normal limits. Paranasal sinuses are largely clear. No mastoid effusion. Other: None. IMPRESSION: 1. 2 cm cystic lesion involving the subcortical white matter of the left parietal lobe demonstrates no intrinsic enhancement. 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 Cerebritis or subacute infarction felt to be unlikely as these with expected to demonstrate associated enhancement. Short interval follow-up MRI in 3 months to document stability is recommended. 2. No other abnormal enhancement elsewhere within the brain. Electronically Signed   By: Jeannine Boga M.D.   On: 09/05/2019 01:14   ECHOCARDIOGRAM COMPLETE  Result Date: 09/04/2019   ECHOCARDIOGRAM REPORT   Patient Name:   RESA ANLIKER Date of Exam: 09/04/2019 Medical Rec #:  [id number]    Height:       73.0 in Accession #:    [id number]   Weight:       184.0 lb Date of Birth:  [birth year]    BSA:          2.08 m Patient Age:    44 years     BP:           108/53 mmHg Patient Gender: F            HR:           67 bpm. Exam Location:  [facility] Procedure: 2D Echo Indications:    Stroke 434.91 / I163.9  History:        Patient has no prior history of Echocardiogram examinations.                 Risk Factors:Former Smoker and Hypertension. History of breast                 cancer, Chronic Renal Disease, GERD.  Sonographer:    Leavy Cella  RDCS (AE) Referring Phys: [place]  1. Left ventricular ejection fraction, by visual estimation, is 70 to 75%. The left ventricle has hyperdynamic function. There is no left ventricular hypertrophy.  2. Left ventricular diastolic parameters are indeterminate.  3. The left ventricle has no regional wall  motion abnormalities.  4. Global right ventricle has normal systolic function.The right ventricular size is normal. No increase in right ventricular wall thickness.  5. Left atrial size was normal.  6. Right atrial size was normal.  7. Presence of pericardial fat pad.  8. The mitral valve is grossly normal. No evidence of mitral valve regurgitation.  9. The tricuspid valve is grossly normal. 10. The aortic valve is tricuspid. Aortic valve regurgitation is not visualized. 11. The pulmonic valve was grossly normal. Pulmonic valve regurgitation is trivial. 12. TR signal is inadequate for assessing pulmonary artery systolic pressure. 13. The inferior vena cava is normal in size with greater than 50% respiratory variability, suggesting right atrial pressure of 3 mmHg. FINDINGS  Left Ventricle: Left ventricular ejection fraction, by visual estimation, is 70 to 75%. The left ventricle has hyperdynamic function. The left ventricle has no regional wall motion abnormalities. There is no left ventricular hypertrophy. Left ventricular diastolic parameters are indeterminate. Right Ventricle: The right ventricular size is normal. No increase in right ventricular wall thickness. Global RV systolic function is has normal systolic function. Left Atrium: Left atrial size was normal in size. Right Atrium: Right atrial size was normal in size Pericardium: There is no evidence of pericardial effusion. Presence of pericardial fat pad. Mitral Valve: The mitral valve is grossly normal. No evidence of mitral valve regurgitation. Tricuspid Valve: The tricuspid valve is grossly normal. Tricuspid valve regurgitation is trivial. Aortic Valve: The aortic valve is tricuspid. Aortic valve regurgitation is not visualized. Pulmonic Valve: The pulmonic valve was grossly normal. Pulmonic valve regurgitation is trivial. Pulmonic regurgitation is trivial. Aorta: The aortic root is normal in size and structure. Venous: The inferior vena cava is normal in  size with greater than 50% respiratory variability, suggesting right atrial pressure of 3 mmHg. IAS/Shunts: No atrial level shunt detected by color flow Doppler.  LEFT VENTRICLE PLAX 2D LVIDd:         3.75 cm  Diastology LVIDs:         2.13 cm  LV e' lateral:   8.92 cm/s LV PW:         0.98 cm  LV E/e' lateral: 8.7 LV IVS:        0.96 cm  LV e' medial:    11.50 cm/s LVOT diam:     2.20 cm  LV E/e' medial:  6.8 LV SV:         45 ml LV SV Index:   21.71 LVOT Area:     3.80 cm  RIGHT VENTRICLE RV S prime:     12.40 cm/s TAPSE (M-mode): 2.2 cm LEFT ATRIUM             Index       RIGHT ATRIUM           Index LA diam:        2.60 cm 1.25 cm/m  RA Area:     11.20 cm LA Vol (A2C):   44.3 ml 21.33 ml/m RA Volume:   27.00 ml  13.00 ml/m LA Vol (A4C):   32.8 ml 15.79 ml/m LA Biplane Vol: 40.5 ml 19.50 ml/m   AORTA Ao Root diam: 2.80 cm  MITRAL VALVE MV Area (PHT): 3.37 cm             SHUNTS MV PHT:        65.25 msec           Systemic Diam: 2.20 cm MV Decel Time: 225 msec MV E velocity: 77.70 cm/s 103 cm/s MV A velocity: 83.20 cm/s 70.3 cm/s MV E/A ratio:  0.93       1.5  Rozann Lesches MD Electronically signed by Rozann Lesches MD Signature Date/Time: 09/04/2019/1:53:34 PM    Final    Scheduled Meds:  clonazePAM  0.5 mg Oral BID   dexamethasone  4 mg Oral Q8H   DULoxetine  60 mg Oral Daily   feeding supplement (ENSURE ENLIVE)  237 mL Oral BID BM   insulin aspart  0-5 Units Subcutaneous QHS   insulin aspart  0-6 Units Subcutaneous TID WC   lactulose  10 g Oral BID   levETIRAcetam  250 mg Oral BID   levothyroxine  100 mcg Oral QAC breakfast   linaclotide  290 mcg Oral QAC breakfast   lurasidone  40 mg Oral QHS   methadone  15 mg Oral Daily   And   methadone  10 mg Oral q1800   pantoprazole  40 mg Oral BID   polyethylene glycol  17 g Oral Daily   rosuvastatin  20 mg Oral q1800   Continuous Infusions:  sodium chloride 10 mL/hr at 09/04/19 1358    LOS: 2 days   Kerney Elbe,  DO [facility] PAGER is on AMION  If 7PM-7AM, please contact night-coverage [email or web address]

## 2019-09-06 ENCOUNTER — Inpatient Hospital Stay (HOSPITAL_COMMUNITY): Payer: Medicare Other

## 2019-09-06 DIAGNOSIS — Z853 Personal history of malignant neoplasm of breast: Secondary | ICD-10-CM

## 2019-09-06 LAB — CBC WITH DIFFERENTIAL/PLATELET
Abs Immature Granulocytes: 0.21 10*3/uL — ABNORMAL HIGH (ref 0.00–0.07)
Basophils Absolute: 0 10*3/uL (ref 0.0–0.1)
Basophils Relative: 0 %
Eosinophils Absolute: 0 10*3/uL (ref 0.0–0.5)
Eosinophils Relative: 0 %
HCT: 33.7 % — ABNORMAL LOW (ref 36.0–46.0)
Hemoglobin: 10 g/dL — ABNORMAL LOW (ref 12.0–15.0)
Immature Granulocytes: 0 %
Lymphocytes Relative: 90 %
Lymphs Abs: 72.1 10*3/uL — ABNORMAL HIGH (ref 0.7–4.0)
MCH: 25.8 pg — ABNORMAL LOW (ref 26.0–34.0)
MCHC: 29.7 g/dL — ABNORMAL LOW (ref 30.0–36.0)
MCV: 86.9 fL (ref 80.0–100.0)
Monocytes Absolute: 1.2 10*3/uL — ABNORMAL HIGH (ref 0.1–1.0)
Monocytes Relative: 1 %
Neutro Abs: 7.4 10*3/uL (ref 1.7–7.7)
Neutrophils Relative %: 9 %
Platelets: 231 10*3/uL (ref 150–400)
RBC: 3.88 MIL/uL (ref 3.87–5.11)
RDW: 15.8 % — ABNORMAL HIGH (ref 11.5–15.5)
WBC: 80.9 10*3/uL (ref 4.0–10.5)
nRBC: 0 % (ref 0.0–0.2)

## 2019-09-06 LAB — TSH: TSH: 2.035 u[IU]/mL (ref 0.350–4.500)

## 2019-09-06 LAB — FERRITIN: Ferritin: 5 ng/mL — ABNORMAL LOW (ref 11–307)

## 2019-09-06 LAB — PROTEIN, CSF: Total  Protein, CSF: 27 mg/dL (ref 15–45)

## 2019-09-06 LAB — COMPREHENSIVE METABOLIC PANEL
ALT: 13 U/L (ref 0–44)
AST: 16 U/L (ref 15–41)
Albumin: 3.6 g/dL (ref 3.5–5.0)
Alkaline Phosphatase: 59 U/L (ref 38–126)
Anion gap: 8 (ref 5–15)
BUN: 15 mg/dL (ref 8–23)
CO2: 25 mmol/L (ref 22–32)
Calcium: 9 mg/dL (ref 8.9–10.3)
Chloride: 107 mmol/L (ref 98–111)
Creatinine, Ser: 0.96 mg/dL (ref 0.44–1.00)
GFR calc Af Amer: >60 mL/min (ref >60)
GFR calc non Af Amer: 60 mL/min — ABNORMAL LOW (ref >60)
Glucose, Bld: 124 mg/dL — ABNORMAL HIGH (ref 70–99)
Potassium: 4 mmol/L (ref 3.5–5.1)
Sodium: 140 mmol/L (ref 135–145)
Total Bilirubin: 0.9 mg/dL (ref 0.3–1.2)
Total Protein: 5.9 g/dL — ABNORMAL LOW (ref 6.5–8.1)

## 2019-09-06 LAB — PHOSPHORUS: Phosphorus: 5.1 mg/dL — ABNORMAL HIGH (ref 2.5–4.6)

## 2019-09-06 LAB — IRON AND TIBC
Iron: 30 ug/dL (ref 28–170)
Saturation Ratios: 6 % — ABNORMAL LOW (ref 10.4–31.8)
TIBC: 476 ug/dL — ABNORMAL HIGH (ref 250–450)
UIBC: 446 ug/dL

## 2019-09-06 LAB — CSF CELL COUNT WITH DIFFERENTIAL
RBC Count, CSF: 89 /mm3 — ABNORMAL HIGH
Tube #: 3
WBC, CSF: 1 /mm3 (ref 0–5)

## 2019-09-06 LAB — VITAMIN B12: Vitamin B-12: 231 pg/mL (ref 180–914)

## 2019-09-06 LAB — GLUCOSE, CAPILLARY
Glucose-Capillary: 126 mg/dL — ABNORMAL HIGH (ref 70–99)
Glucose-Capillary: 129 mg/dL — ABNORMAL HIGH (ref 70–99)
Glucose-Capillary: 175 mg/dL — ABNORMAL HIGH (ref 70–99)
Glucose-Capillary: 165 mg/dL — ABNORMAL HIGH (ref 70–99)

## 2019-09-06 LAB — RETICULOCYTES
Immature Retic Fract: 16 % — ABNORMAL HIGH (ref 2.3–15.9)
RBC.: 3.88 MIL/uL (ref 3.87–5.11)
Retic Count, Absolute: 56.3 10*3/uL (ref 19.0–186.0)
Retic Ct Pct: 1.5 % (ref 0.4–3.1)

## 2019-09-06 LAB — GLUCOSE, CSF: Glucose, CSF: 79 mg/dL — ABNORMAL HIGH (ref 40–70)

## 2019-09-06 LAB — MAGNESIUM: Magnesium: 2.3 mg/dL (ref 1.7–2.4)

## 2019-09-06 LAB — FOLATE: Folate: 7.3 ng/mL (ref >5.9)

## 2019-09-06 MED ORDER — POLYETHYLENE GLYCOL 3350 17 G PO PACK
17.0000 g | PACK | Freq: Two times a day (BID) | ORAL | Status: DC
  Administered 2019-09-06 – 2019-09-08 (×5): 17 g via ORAL
  Filled 2019-09-06 (×5): qty 1

## 2019-09-06 MED ORDER — BISACODYL 10 MG RE SUPP
10.0000 mg | Freq: Every day | RECTAL | Status: DC | PRN
  Filled 2019-09-06: qty 1

## 2019-09-06 NOTE — Progress Notes (Addendum)
PT Cancellation Note  Patient Details Name: Renee [name] MRN: [id number] DOB: May 03, 1946   Cancelled Treatment:    Reason Eval/Treat Not Completed: Medical issues which prohibited therapy Bedrest s/p LP  Ellamae Sia, PT, DPT Acute Rehabilitation Services Pager [phone] Office [phone]    Willy Eddy 09/06/2019, 12:37 PM

## 2019-09-06 NOTE — Progress Notes (Signed)
Neurology Progress Note   S:// Seen and examined.  No change in weakness.   O:// Current vital signs: BP (!) 100/54 (BP Location: Right Arm)   Pulse 67   Temp 97.7 F (36.5 C) (Oral)   Resp 16   Ht 6\' 1"  (1.854 m)   Wt 83.5 kg   SpO2 99%   BMI 24.28 kg/m  Vital signs in last 24 hours: Temp:  [97.5 F (36.4 C)-98.2 F (36.8 C)] 97.7 F (36.5 C) (01/03 0832) Pulse Rate:  [66-78] 67 (01/03 0832) Resp:  [15-17] 16 (01/03 0832) BP: (94-121)/(38-61) 100/54 (01/03 0832) SpO2:  [94 %-99 %] 99 % (01/03 0832) General exam: Awake alert oriented x3 no distress sitting comfortably in bed HEENT: Normocephalic atraumatic Cardiovascular: Regular rate rhythm Respiratory: Breathing normally saturating well on room air Extremities warm well perfused without edema Neurological exam Awake alert oriented x3 Speech is nondysarthric Naming repetition comprehension intact Cranial nerves: Pupils equal round reactive to light, extraocular movements intact, visual fields full, facial sensation intact, subtle right nasolabial fold flattening, mildly decreased auditory acuity bilaterally, tongue and palate are midline. Motor exam: Right upper extremity 4/5 with pronator drift.  Right lower extremity barely 3/5 at the hip and 4 - at the knee and 4 at ankle plantar and dorsiflexion. Sensory exam: Intact light touch DTRs: Brisk on the right with upgoing right toe. No gross dysmetria noted  Medications  Current Facility-Administered Medications:  .  acetaminophen (TYLENOL) tablet 650 mg, 650 mg, Oral, Q4H PRN **OR** acetaminophen (TYLENOL) 160 MG/5ML solution 650 mg, 650 mg, Per Tube, Q4H PRN **OR** acetaminophen (TYLENOL) suppository 650 mg, 650 mg, Rectal, Q4H PRN, Norins, Heinz Knuckles, MD .  bisacodyl (DULCOLAX) suppository 10 mg, 10 mg, Rectal, Daily PRN, Sheikh, Omair Latif, DO .  clonazePAM Bobbye [place]) tablet 0.5 mg, 0.5 mg, Oral, BID, Norins, Heinz Knuckles, MD, 0.5 mg at 09/05/19 2132 .  dexamethasone  (DECADRON) tablet 4 mg, 4 mg, Oral, Q8H, Emokpae, Courage, MD, 4 mg at 09/06/19 [id number] .  DULoxetine (CYMBALTA) DR capsule 60 mg, 60 mg, Oral, Daily, Norins, Heinz Knuckles, MD, 60 mg at 09/05/19 [id number] .  feeding supplement (ENSURE ENLIVE) (ENSURE ENLIVE) liquid 237 mL, 237 mL, Oral, BID BM, Sheikh, Omair Latif, DO, 237 mL at 09/05/19 1401 .  insulin aspart (novoLOG) injection 0-5 Units, 0-5 Units, Subcutaneous, QHS, Emokpae, Courage, MD .  insulin aspart (novoLOG) injection 0-6 Units, 0-6 Units, Subcutaneous, TID WC, Emokpae, Courage, MD .  lactulose (CHRONULAC) 10 GM/15ML solution 10 g, 10 g, Oral, BID, Emokpae, Courage, MD, 10 g at 09/05/19 2131 .  levETIRAcetam (KEPPRA) tablet 250 mg, 250 mg, Oral, BID, Norins, Heinz Knuckles, MD, 250 mg at 09/05/19 2132 .  levothyroxine (SYNTHROID) tablet 100 mcg, 100 mcg, Oral, QAC breakfast, Norins, Heinz Knuckles, MD, 100 mcg at 09/05/19 [id number] .  linaclotide (LINZESS) capsule 290 mcg, 290 mcg, Oral, QAC breakfast, Norins, Heinz Knuckles, MD, 290 mcg at 09/05/19 1039 .  lurasidone (LATUDA) tablet 40 mg, 40 mg, Oral, QHS, Norins, Heinz Knuckles, MD, 40 mg at 09/05/19 2221 .  methadone (DOLOPHINE) tablet 15 mg, 15 mg, Oral, Daily, 15 mg at 09/05/19 0938 **AND** methadone (DOLOPHINE) tablet 10 mg, 10 mg, Oral, q1800, Norins, Heinz Knuckles, MD, 10 mg at 09/05/19 1745 .  pantoprazole (PROTONIX) EC tablet 40 mg, 40 mg, Oral, BID, Norins, Heinz Knuckles, MD, 40 mg at 09/05/19 2132 .  polyethylene glycol (MIRALAX / GLYCOLAX) packet 17 g, 17 g, Oral, BID, Sheikh, [place], DO .  rosuvastatin (CRESTOR) tablet 20 mg, 20 mg, Oral, q1800, Norins, Heinz Knuckles, MD, 20 mg at 09/05/19 1744 .  senna-docusate (Senokot-S) tablet 1 tablet, 1 tablet, Oral, BID, Raiford Noble [place], [place], 1 tablet at 09/05/19 2132 Labs CBC    Component Value Date/Time   WBC 80.9 (HH) 09/06/2019 0551   RBC 3.88 09/06/2019 0551   RBC 3.88 09/06/2019 0551   HGB 10.0 (L) 09/06/2019 0551   HCT 33.7 (L) 09/06/2019 0551   PLT 231  09/06/2019 0551   MCV 86.9 09/06/2019 0551   MCH 25.8 (L) 09/06/2019 0551   MCHC 29.7 (L) 09/06/2019 0551   RDW 15.8 (H) 09/06/2019 0551   LYMPHSABS 72.1 (H) 09/06/2019 0551   MONOABS 1.2 (H) 09/06/2019 0551   EOSABS 0.0 09/06/2019 0551   BASOSABS 0.0 09/06/2019 0551    CMP     Component Value Date/Time   NA 140 09/06/2019 0551   K 4.0 09/06/2019 0551   CL 107 09/06/2019 0551   CO2 25 09/06/2019 0551   GLUCOSE 124 (H) 09/06/2019 0551   BUN 15 09/06/2019 0551   CREATININE 0.96 09/06/2019 0551   CALCIUM 9.0 09/06/2019 0551   PROT 5.9 (L) 09/06/2019 0551   ALBUMIN 3.6 09/06/2019 0551   AST 16 09/06/2019 0551   ALT 13 09/06/2019 0551   ALKPHOS 59 09/06/2019 0551   BILITOT 0.9 09/06/2019 0551   GFRNONAA 60 (L) 09/06/2019 0551   GFRAA >60 09/06/2019 0551    Imaging I have reviewed images in epic and the results pertinent to this consultation are: No new imaging or diagnostics MRI examination of the brain-without contrast Findings not suggestive of ischemic infarction. 2 cm cystic lesion in the left parietal deep white matter with regional vasogenic edema and a possible wall showing some restricted diffusion. Differential diagnosis is tumor versus cerebritis. Postcontrast imaging would be suggested for more complete evaluation.  MRI of the brain with contrast IMPRESSION: 1. 2 cm cystic lesion involving the subcortical white matter of the left parietal lobe demonstrates no intrinsic enhancement. 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 Cerebritis or subacute infarction felt to be unlikely as these with expected to demonstrate associated enhancement. Short interval follow-up MRI in 3 months to document stability is recommended. 2. No other abnormal enhancement elsewhere within the brain.  EEG with slowing in the left parietal region consistent with the lesion seen on MRI.  Assessment: 74 year old woman with past medical history of CLL, hypertension,  CKD 3 and thyroid disease along with neuropathy presented with complaints of frequent falls for the past 3 weeks and reports he has been getting progressively worse in terms of strength of the right leg and has been dragging her right leg. Noncontrast head CT concerning for left frontal hypodensity, MRI brain with and without contrast reveals lesion in the left frontal area suggestive of a mass Spinal tap pending  Recommendations: Obtain LP Check for glucose, protein, cell count, Gram stain, cytology, flow cytometry and also JC virus due to given immunocompromise status. If CSF results remain unremarkable, biopsy should be considered.  I think that can be done outpatient-can have the patient follow-up with Dr. Mickeal Skinner in neuro-oncology for follow-up. Consider PET scan-will defer to neuro-oncology on outpatient evaluation Continue with dexamethasone for now PT OT  -- Amie Portland, MD [facility] Pager: [phone] If 7pm to 7am, please call on call as listed on AMION.

## 2019-09-06 NOTE — Progress Notes (Signed)
PROGRESS NOTE    Renee [name]  [id number] DOB: [birth year] DOA: 09/03/2019 PCP: Ephriam Jenkins E   Brief Narrative:  HPI per Dr. Adella Hare on 09/03/2019 Renee [name] is a 74 y.o. female with medical history significant of stage I CLL, and a neuralgia followed at the [facility] pain clinic, hypertension, CKD 3, thyroid disease.  Patient reports that several weeks ago she started to develop increasing paresthesia in her right foot.  She does have a history of peripheral neuropathy to which she attributed her symptoms.  Over the intervening period of time she had progressive paresthesia in the right leg and increasing weakness.  She reports that she has had multiple falls due to poor balance.  She has had no loss of vision.  Had no severe headache.  She has had no tremor.  She had no fevers or chills.  Because of her progressive weakness and falling she presents to the [facility] emergency department for evaluation  ED Course: Patient is hemodynamically stable in the emergency department.  Her evaluation did reveal some right-sided weakness and ataxia.  CT scan of the head revealed a left parietal abnormality.  MRI/MRA reveals 2 cm cystic lesion deep left parietal region with edema, no vascular occlusion. She is referred to [facility] for admission work-up for cystic brain lesion.  **Interim History  Patient was transferred to [facility] for further evaluation and neurology was consulted.  Neurology evaluated and recommended MRI and they will discuss with radiology for further interventions and work-up.  Patient is to undergo a fluoroscopic guided spinal tap CSF is to be sent out for glucose, protein, cell count, Gram stain, cytology, flow cytometry, and also JC virus.  Per neurology if the CSF remains unremarkable for infection does not point towards a malignant or other cause, the only other way to be sure of what we are dealing with would be a definitive diagnosis with tissue sampling and biopsy and  then at that time neurosurgery will be consulted.    Patient continues to be weak on her right side and has fallen multiple times and now is complaining that her right arm is being weak.  States that she has been dragging her foot now.  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 Active Problems:   Chronic depression   Chronic pain   Hypothyroidism   Pudendal neuralgia   CLL (chronic lymphocytic leukemia) (HCC)   History of breast cancer   Chronic renal disease, stage 3, moderately decreased glomerular filtration rate between 30-59 mL/min/1.73 square meter   GERD (gastroesophageal reflux disease)   Right sided weakness  Recurrent falls with right-sided weakness -With cranial imaging studies showing 2 CM Left parietal lobe lesion--- tumor versus cerebritis -Worked up for CVA and neurology does not feel that this is an acute CVA -Transferred to [place]  for further neurology and possibly neurosurgical and oncology consults; the neurologist is on board and there weighing in their opinions and will let me know if they feel that neurosurgical and oncology evaluation is necessary -Telemetry neurologist advised Keppra 250 twice daily for seizure prophylaxis but our in-house neurologist recommends discontinuation as of any underlying seizures and no need for antiepileptics at this time. -Decadron 4 mg every 8 hours and will continue for now -MRA Angio Head and Brain w/o Contrast showed "Normal MR angiography. Findings not suggestive of ischemic infarction. 2 cm cystic lesion in the left parietal deep white matter  with regional vasogenic edema and a possible wall showing some restricted diffusion. Differential diagnosis is tumor versus cerebritis. Postcontrast imaging would be suggested for more complete evaluation." -MRI Brain with Contrast done and showed "2 cm cystic lesion involving the subcortical white matter of the left  parietal lobe demonstrates no intrinsic enhancement. 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 Cerebritis or subacute infarction felt to be unlikely as these with expected to demonstrate associated enhancement. Short interval follow-up MRI in 3 months to document stability is recommended. No other abnormal enhancement elsewhere within the brain." -Patient slid down and fell to the floor yesterday witnessed by nursing staff as she attempted to get from the bedside commode back to the bed -Continue with very sensitive NovoLog sliding scale insulin while patient is on steroids; CBGs have been ranging from 126-157 -EEG Done and showed "This is an abnormal electroencephalogram secondary to slowing noted in the left parietal region consistent with the patient's radiological findings" -PT/OT recommending CIR; Consult to be placed for Rehab Admissions Coordinator -Neurology Consulted for further Recommendations recommended brain MRI as above -They also recommending CT of the chest abdomen pelvis to assess for possible primary malignancy the patient does have CLL and will need to speak to them more about this  -Per my discussion with the neurologist Dr. Rory Percy he will concur with radiology and will make further recommendations as this is an atypical presentation and they may consider a spinal tap and possible biopsy -Patient is to undergo a fluoroscopic guided spinal tap CSF is to be sent out for glucose, protein, cell count, Gram stain, cytology, flow cytometry, and also JC virus.   -Per neurology if the CSF remains unremarkable for infection does not point towards a malignant or other cause, the only other way to be sure of what we are dealing with would be a definitive diagnosis with tissue sampling and biopsy and then at that time neurosurgery will be consulted but Dr. Malen Gauze feels that this can be done in outpatient setting and patient can follow-up with Dr. Mickeal Skinner neuro oncology for  follow-up -Neurology recommending continuing dexamethasone for now as above -Continue to Monitor closely  Stage I CLL -WBC over 100k and is now improved and this morning was 80.9 -She sees Hematology/Oncology Dr. Delton Coombes at [facility] WBC is around 80,000 per patient -Likely to elevate further in the setting of steroid demargination with dexamethasone 4 mg p.o. every 8h  Peripheral Neuropathy/Pudendal Neuralgia/Chronic Pain -Continue home pain regimen including Methadone 15 mg in the AM and 10 mg at 1800 daily and with Morphine 15 mg po q4hprn Severe Pain  -Patient's weakness is the same as yesterday and she did slide to the floor yesterday as she was attempting to transfer from the bedside commode to the bed  History of Breast Cancer -Apparently in remission since 1999.   -Intracranial imaging does not suggest metastatic breast cancer  Hypothyroidism -Check TSH and was 2.035 -C/w Levothyroxine 100 mcg daily  Depressive Disorder with Anxiety  -Stable -Continue Lurasidone 40 mg p.o. daily and continue Duloxetine 60 mg daily -C/w Clonazepam 0.5 mg twice daily  CKD III -Stable at this time, renal function appears to be at baseline, with a creatinine of 1.0 and GFR of 53 -BUN/Cr went from 10/1.04 -> 15/0.96 -Avoid nephrotoxic medications, contrast dyes, hypotension and renally adjust medications -Continue monitor and trend renal function  -Repeat CMP in a.m.  HLD -Lipid Panel Showed total cholesterol/HDL ratio of 3.1, cholesterol level of 110, HDL  35, LDL 52, triglycerides of 117, and VLDL of 23 -C/w Rosuvastatin 20 mg po qDaily   IBS/Constipation Concerns -Continue Lactulose 10 g twice daily along with Linaclotide 290 mcg po Daily before breakfast -MiraLAX as ordered with 17 grams po Daily but increased to 17 grams BID -Added Bisacodyl Suppository  -Resume Home Senna-Docusate 1 tab p.o. twice daily  Normocytic Anemia -Patient had a hemoglobin/hematocrit  of 9.7/32.5 yesterday morning with an MCV of 86.9; morning it was 10.0/33.7 -Checked anemia panel and showed an iron level of 30, IBC of 446, TIBC of 476, saturation ratio of 6%, ferritin of 5, folate of 7.3, and vitamin B12 level of 231 -Continue to monitor for signs and symptoms of bleeding; currently no overt bleeding noted -Repeat CBC in the a.m.  GERD -C/w Pantoprazole 40 mg po BID   GOC: DNR, poA  DVT prophylaxis: SCDs Code Status: DO NOT RESUSCITATE Family Communication: No family present at bedside Disposition Plan: Pending further Neurology Evalaution  Consultants:   TeleNeurology  Neurology   Interventional radiology   Procedures: MRI  EEG Conditions of Recording:  This is a 21 channel routine scalp EEG performed with bipolar and monopolar montages arranged in accordance to the international 10/20 system of electrode placement. One channel was dedicated to EKG recording.  The patient is in the awake state.  Description:  The waking background activity consists of a low voltage, symmetrical, fairly well organized, 10 Hz alpha activity, seen from the parieto-occipital and posterior temporal regions.  Low voltage fast activity, poorly organized, is seen anteriorly and is at times superimposed on more posterior regions.  A mixture of theta and alpha rhythms are seen from the central and temporal regions.  Despite the frequently noted artifact over the left hemisphere, this activity is slower over the left parietal region where there are noted underlying slower rhythms that slow the background in this region The patient does not drowse or sleep. Hyperventilation was not performed.   Intermittent photic stimulation was performed and elicits a symmetrical driving response but fails to elicit any abnormalities.  IMPRESSION: This is an abnormal electroencephalogram secondary to slowing noted in the left parietal region consistent with the patient's radiological findings.    ECHOCARDIOGRAM IMPRESSIONS    1. Left ventricular ejection fraction, by visual estimation, is 70 to 75%. The left ventricle has hyperdynamic function. There is no left ventricular hypertrophy.  2. Left ventricular diastolic parameters are indeterminate.  3. The left ventricle has no regional wall motion abnormalities.  4. Global right ventricle has normal systolic function.The right ventricular size is normal. No increase in right ventricular wall thickness.  5. Left atrial size was normal.  6. Right atrial size was normal.  7. Presence of pericardial fat pad.  8. The mitral valve is grossly normal. No evidence of mitral valve regurgitation.  9. The tricuspid valve is grossly normal. 10. The aortic valve is tricuspid. Aortic valve regurgitation is not visualized. 11. The pulmonic valve was grossly normal. Pulmonic valve regurgitation is trivial. 12. TR signal is inadequate for assessing pulmonary artery systolic pressure. 13. The inferior vena cava is normal in size with greater than 50% respiratory variability, suggesting right atrial pressure of 3 mmHg.  FINDINGS  Left Ventricle: Left ventricular ejection fraction, by visual estimation, is 70 to 75%. The left ventricle has hyperdynamic function. The left ventricle has no regional wall motion abnormalities. There is no left ventricular hypertrophy. Left  ventricular diastolic parameters are indeterminate.  Right Ventricle: The right ventricular size  is normal. No increase in right ventricular wall thickness. Global RV systolic function is has normal systolic function.  Left Atrium: Left atrial size was normal in size.  Right Atrium: Right atrial size was normal in size  Pericardium: There is no evidence of pericardial effusion. Presence of pericardial fat pad.  Mitral Valve: The mitral valve is grossly normal. No evidence of mitral valve regurgitation.  Tricuspid Valve: The tricuspid valve is grossly normal. Tricuspid  valve regurgitation is trivial.  Aortic Valve: The aortic valve is tricuspid. Aortic valve regurgitation is not visualized.  Pulmonic Valve: The pulmonic valve was grossly normal. Pulmonic valve regurgitation is trivial. Pulmonic regurgitation is trivial.  Aorta: The aortic root is normal in size and structure.  Venous: The inferior vena cava is normal in size with greater than 50% respiratory variability, suggesting right atrial pressure of 3 mmHg.  IAS/Shunts: No atrial level shunt detected by color flow Doppler.    LEFT VENTRICLE PLAX 2D LVIDd:         3.75 cm  Diastology LVIDs:         2.13 cm  LV e' lateral:   8.92 cm/s LV PW:         0.98 cm  LV E/e' lateral: 8.7 LV IVS:        0.96 cm  LV e' medial:    11.50 cm/s LVOT diam:     2.20 cm  LV E/e' medial:  6.8 LV SV:         45 ml LV SV Index:   21.71 LVOT Area:     3.80 cm    RIGHT VENTRICLE RV S prime:     12.40 cm/s TAPSE (M-mode): 2.2 cm  LEFT ATRIUM             Index       RIGHT ATRIUM           Index LA diam:        2.60 cm 1.25 cm/m  RA Area:     11.20 cm LA Vol (A2C):   44.3 ml 21.33 ml/m RA Volume:   27.00 ml  13.00 ml/m LA Vol (A4C):   32.8 ml 15.79 ml/m LA Biplane Vol: 40.5 ml 19.50 ml/m    AORTA Ao Root diam: 2.80 cm  MITRAL VALVE MV Area (PHT): 3.37 cm             SHUNTS MV PHT:        65.25 msec           Systemic Diam: 2.20 cm MV Decel Time: 225 msec MV E velocity: 77.70 cm/s 103 cm/s MV A velocity: 83.20 cm/s 70.3 cm/s MV E/A ratio:  0.93       1.5    Antimicrobials:  Anti-infectives (From admission, onward)   None     Subjective: Seen and examined at bedside and states that her leg weakness is about the same and she is little frustrated laying in bed.  Still not had a proper bowel movement in about 4 days and asking for a suppository.  Also complaining that she is not getting her Linzess on time as she takes it first thing in the morning when she wakes up.  No nausea or  vomiting.  No other concerns or complaints at this time.  Objective: Vitals:   09/05/19 1945 09/06/19 0011 09/06/19 0014 09/06/19 0426  BP: 121/60 (!) 110/54 (!) 114/56 (!) 109/53  Pulse: 66 68 66 67  Resp: 17 17  16  Temp: 98.1 F (36.7 C) (!) 97.5 F (36.4 C) 97.8 F (36.6 C) 98.2 F (36.8 C)  TempSrc: Oral Oral Oral Oral  SpO2: 96% 99%  98%  Weight:      Height:        Intake/Output Summary (Last 24 hours) at 09/06/2019 0749 Last data filed at 09/06/2019 [id number] Gross per 24 hour  Intake --  Output 1700 ml  Net -1700 ml   Filed Weights   09/03/19 1302  Weight: 83.5 kg   Examination:  Physical Exam:  Constitutional: WN/WD elderly Caucasian female currently a little frustrated laying in bed but does not actively appear to be in any acute distress Eyes: Lids and conjunctivae normal, sclerae anicteric  ENMT: External Ears, Nose appear normal. Grossly normal hearing. Mucous membranes are moist.  Neck: Appears normal, supple, no cervical masses, normal ROM, no appreciable thyromegaly; no JVD Respiratory: Diminished to auscultation bilaterally, no wheezing, rales, rhonchi or crackles. Normal respiratory effort and patient is not tachypenic. No accessory muscle use.  Unlabored breathing Cardiovascular: RRR, no murmurs / rubs / gallops. S1 and S2 auscultated. No extremity edema.  Abdomen: Soft, non-tender, non-distended. Bowel sounds positive x4.  GU: Deferred. Musculoskeletal: No clubbing / cyanosis of digits/nails. Good ROM, no contractures. Normal strength and muscle tone.  She has diminished strength on the right side with right lower leg being weak. Skin: No rashes, lesions, ulcers on limited skin evaluation. No induration; Warm and dry.  Neurologic: CN 2-12 grossly intact with no focal deficits. She states that the sensation in bilateral legs are essentially the same  Psychiatric: Normal judgment and insight. Alert and oriented x 3. Normal mood and appropriate affect.   Data  Reviewed: I have personally reviewed following labs and imaging studies  CBC: Recent Labs  Lab 09/03/19 1716 09/05/19 0757 09/06/19 0551  WBC 103.5* 74.1* 80.9*  NEUTROABS 5.3 6.6 7.4  HGB 11.2* 9.7* 10.0*  HCT 39.3 32.5* 33.7*  MCV 89.5 86.9 86.9  PLT 266 212 [id number]   Basic Metabolic Panel: Recent Labs  Lab 09/03/19 1716 09/05/19 0757 09/06/19 0551  NA 140 140 140  K 4.0 4.5 4.0  CL 103 108 107  CO2 24 24 25   GLUCOSE 103* 131* 124*  BUN 11 10 15   CREATININE 1.05* 1.04* 0.96  CALCIUM 9.2 8.7* 9.0  MG  --  2.2 2.3  PHOS  --  4.2 5.1*   GFR: Estimated Creatinine Clearance: 64 mL/min (by C-G formula based on SCr of 0.96 mg/dL). Liver Function Tests: Recent Labs  Lab 09/03/19 1716 09/05/19 0757 09/06/19 0551  AST 24 18 16   ALT 18 15 13   ALKPHOS 74 52 59  BILITOT 1.2 0.7 0.9  PROT 7.3 5.9* 5.9*  ALBUMIN 4.4 3.6 3.6   No results for input(s): LIPASE, AMYLASE in the last 168 hours. No results for input(s): AMMONIA in the last 168 hours. Coagulation Profile: Recent Labs  Lab 09/03/19 1716  INR 0.9   Cardiac Enzymes: No results for input(s): CKTOTAL, CKMB, CKMBINDEX, TROPONINI in the last 168 hours. BNP (last 3 results) No results for input(s): PROBNP in the last 8760 hours. HbA1C: Recent Labs    09/04/19 0416  HGBA1C 5.4   CBG: Recent Labs  Lab 09/05/19 0607 09/05/19 1208 09/05/19 1724 09/05/19 2116 09/06/19 0621  GLUCAP 128* 114* 157* 141* 126*   Lipid Profile: Recent Labs    09/04/19 0416  CHOL 110  HDL 35*  LDLCALC 52  TRIG 117  CHOLHDL  3.1   Thyroid Function Tests: Recent Labs    09/06/19 0551  TSH 2.035   Anemia Panel: Recent Labs    09/06/19 0551  VITAMINB12 231  FOLATE 7.3  FERRITIN 5*  TIBC 476*  IRON 30  RETICCTPCT 1.5   Sepsis Labs: No results for input(s): PROCALCITON, LATICACIDVEN in the last 168 hours.  Recent Results (from the past 240 hour(s))  SARS CORONAVIRUS 2 (TAT 6-24 HRS) Nasopharyngeal Nasopharyngeal  Swab     Status: None   Collection Time: 09/03/19  9:04 PM   Specimen: Nasopharyngeal Swab  Result Value Ref Range Status   SARS Coronavirus 2 NEGATIVE NEGATIVE Final    Comment: (NOTE) SARS-CoV-2 target nucleic acids are NOT DETECTED. The SARS-CoV-2 RNA is generally detectable in upper and lower respiratory specimens during the acute phase of infection. Negative results do not preclude SARS-CoV-2 infection, do not rule out co-infections with other pathogens, and should not be used as the sole basis for treatment or other patient management decisions. Negative results must be combined with clinical observations, patient history, and epidemiological information. The expected result is Negative. Fact Sheet for Patients: SugarRoll.be Fact Sheet for Healthcare Providers: https://www.woods-mathews.com/ This test is not yet approved or cleared by the [place] FDA and  has been authorized for detection and/or diagnosis of SARS-CoV-2 by FDA under an Emergency Use Authorization (EUA). This EUA will remain  in effect (meaning this test can be used) for the duration of the COVID-19 declaration under Section 56 4(b)(1) of the Act, 21 U.S.C. section 360bbb-3(b)(1), unless the authorization is terminated or revoked sooner. Performed at [facility], [place] [address], [place], [place] [address]    Radiology Studies: EEG  Result Date: 09/05/2019 Alexis Goodell, MD     09/05/2019 11:29 AM ELECTROENCEPHALOGRAM REPORT Patient: Renee [name]       Room #: D501236 EEG No. ID: [id number] Age: 74 y.o.        Sex: female Referring Physician: Alfredia Ferguson Report Date:  09/05/2019       Interpreting Physician: Alexis Goodell History: Renee [name] is an 74 y.o. female with right sided weakness Medications: Klonopin, Decadron, Cymbalta, Insulin, Keppra, Synthroid, Linzess, Latuda, Methadone, Crestor Conditions of Recording:  This is a 21 channel routine scalp EEG performed  with bipolar and monopolar montages arranged in accordance to the international 10/20 system of electrode placement. One channel was dedicated to EKG recording. The patient is in the awake state. Description:  The waking background activity consists of a low voltage, symmetrical, fairly well organized, 10 Hz alpha activity, seen from the parieto-occipital and posterior temporal regions.  Low voltage fast activity, poorly organized, is seen anteriorly and is at times superimposed on more posterior regions.  A mixture of theta and alpha rhythms are seen from the central and temporal regions.  Despite the frequently noted artifact over the left hemisphere, this activity is slower over the left parietal region where there are noted underlying slower rhythms that slow the background in this region The patient does not drowse or sleep. Hyperventilation was not performed.   Intermittent photic stimulation was performed and elicits a symmetrical driving response but fails to elicit any abnormalities. IMPRESSION: This is an abnormal electroencephalogram secondary to slowing noted in the left parietal region consistent with the patient's radiological findings.  Alexis Goodell, MD Neurology [phone] 09/05/2019, 11:24 AM   MR BRAIN W CONTRAST  Result Date: 09/05/2019 CLINICAL DATA:  Follow-up examination for signal abnormality at the left parietal lobe. EXAM: MRI  HEAD WITH CONTRAST TECHNIQUE: Multiplanar, multiecho pulse sequences of the brain and surrounding structures were obtained with intravenous contrast. CONTRAST:  59mL GADAVIST GADOBUTROL 1 MMOL/ML IV SOLN COMPARISON:  Prior brain MRI from 09/03/2019. FINDINGS: Brain: Postcontrast imaging only was performed. Following contrast administration, the previously identified 2 cm cystic lesion involving the subcortical white matter of the mesial left parietal lobe does not demonstrate any significant intrinsic enhancement. No other abnormal enhancement seen elsewhere  within the brain. Vascular: Normal intravascular enhancement seen throughout the major intracranial vasculature. Skull and upper cervical spine: Craniocervical junction within normal limits. Bone marrow signal intensity normal. No scalp soft tissue abnormality. Sinuses/Orbits: Globes and orbital soft tissues within normal limits. Paranasal sinuses are largely clear. No mastoid effusion. Other: None. IMPRESSION: 1. 2 cm cystic lesion involving the subcortical white matter of the left parietal lobe demonstrates no intrinsic enhancement. 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 Cerebritis or subacute infarction felt to be unlikely as these with expected to demonstrate associated enhancement. Short interval follow-up MRI in 3 months to document stability is recommended. 2. No other abnormal enhancement elsewhere within the brain. Electronically Signed   By: Jeannine Boga M.D.   On: 09/05/2019 01:14   ECHOCARDIOGRAM COMPLETE  Result Date: 09/04/2019   ECHOCARDIOGRAM REPORT   Patient Name:   Renee [name] Date of Exam: 09/04/2019 Medical Rec #:  [id number]    Height:       73.0 in Accession #:    [id number]   Weight:       184.0 lb Date of Birth:  1945/09/17    BSA:          2.08 m Patient Age:    36 years     BP:           108/53 mmHg Patient Gender: F            HR:           67 bpm. Exam Location:  [facility] Procedure: 2D Echo Indications:    Stroke 434.91 / I163.9  History:        Patient has no prior history of Echocardiogram examinations.                 Risk Factors:Former Smoker and Hypertension. History of breast                 cancer, Chronic Renal Disease, GERD.  Sonographer:    Leavy Cella RDCS (AE) Referring Phys: [place]  1. Left ventricular ejection fraction, by visual estimation, is 70 to 75%. The left ventricle has hyperdynamic function. There is no left ventricular hypertrophy.  2. Left ventricular diastolic parameters are indeterminate.  3. The  left ventricle has no regional wall motion abnormalities.  4. Global right ventricle has normal systolic function.The right ventricular size is normal. No increase in right ventricular wall thickness.  5. Left atrial size was normal.  6. Right atrial size was normal.  7. Presence of pericardial fat pad.  8. The mitral valve is grossly normal. No evidence of mitral valve regurgitation.  9. The tricuspid valve is grossly normal. 10. The aortic valve is tricuspid. Aortic valve regurgitation is not visualized. 11. The pulmonic valve was grossly normal. Pulmonic valve regurgitation is trivial. 12. TR signal is inadequate for assessing pulmonary artery systolic pressure. 13. The inferior vena cava is normal in size with greater than 50% respiratory variability, suggesting right atrial pressure of 3 mmHg. FINDINGS  Left Ventricle: Left ventricular ejection fraction, by visual estimation, is 70 to 75%. The left ventricle has hyperdynamic function. The left ventricle has no regional wall motion abnormalities. There is no left ventricular hypertrophy. Left ventricular diastolic parameters are indeterminate. Right Ventricle: The right ventricular size is normal. No increase in right ventricular wall thickness. Global RV systolic function is has normal systolic function. Left Atrium: Left atrial size was normal in size. Right Atrium: Right atrial size was normal in size Pericardium: There is no evidence of pericardial effusion. Presence of pericardial fat pad. Mitral Valve: The mitral valve is grossly normal. No evidence of mitral valve regurgitation. Tricuspid Valve: The tricuspid valve is grossly normal. Tricuspid valve regurgitation is trivial. Aortic Valve: The aortic valve is tricuspid. Aortic valve regurgitation is not visualized. Pulmonic Valve: The pulmonic valve was grossly normal. Pulmonic valve regurgitation is trivial. Pulmonic regurgitation is trivial. Aorta: The aortic root is normal in size and structure. Venous:  The inferior vena cava is normal in size with greater than 50% respiratory variability, suggesting right atrial pressure of 3 mmHg. IAS/Shunts: No atrial level shunt detected by color flow Doppler.  LEFT VENTRICLE PLAX 2D LVIDd:         3.75 cm  Diastology LVIDs:         2.13 cm  LV e' lateral:   8.92 cm/s LV PW:         0.98 cm  LV E/e' lateral: 8.7 LV IVS:        0.96 cm  LV e' medial:    11.50 cm/s LVOT diam:     2.20 cm  LV E/e' medial:  6.8 LV SV:         45 ml LV SV Index:   21.71 LVOT Area:     3.80 cm  RIGHT VENTRICLE RV S prime:     12.40 cm/s TAPSE (M-mode): 2.2 cm LEFT ATRIUM             Index       RIGHT ATRIUM           Index LA diam:        2.60 cm 1.25 cm/m  RA Area:     11.20 cm LA Vol (A2C):   44.3 ml 21.33 ml/m RA Volume:   27.00 ml  13.00 ml/m LA Vol (A4C):   32.8 ml 15.79 ml/m LA Biplane Vol: 40.5 ml 19.50 ml/m   AORTA Ao Root diam: 2.80 cm MITRAL VALVE MV Area (PHT): 3.37 cm             SHUNTS MV PHT:        65.25 msec           Systemic Diam: 2.20 cm MV Decel Time: 225 msec MV E velocity: 77.70 cm/s 103 cm/s MV A velocity: 83.20 cm/s 70.3 cm/s MV E/A ratio:  0.93       1.5  Rozann Lesches MD Electronically signed by Rozann Lesches MD Signature Date/Time: 09/04/2019/1:53:34 PM    Final    Scheduled Meds:  clonazePAM  0.5 mg Oral BID   dexamethasone  4 mg Oral Q8H   DULoxetine  60 mg Oral Daily   feeding supplement (ENSURE ENLIVE)  237 mL Oral BID BM   insulin aspart  0-5 Units Subcutaneous QHS   insulin aspart  0-6 Units Subcutaneous TID WC   lactulose  10 g Oral BID   levETIRAcetam  250 mg Oral BID   levothyroxine  100 mcg Oral QAC  breakfast   linaclotide  290 mcg Oral QAC breakfast   lurasidone  40 mg Oral QHS   methadone  15 mg Oral Daily   And   methadone  10 mg Oral q1800   pantoprazole  40 mg Oral BID   polyethylene glycol  17 g Oral Daily   rosuvastatin  20 mg Oral q1800   senna-docusate  1 tablet Oral BID   Continuous Infusions:   LOS: 3  days   Kerney Elbe, DO [facility] PAGER is on AMION  If 7PM-7AM, please contact night-coverage [email or web address]

## 2019-09-07 ENCOUNTER — Encounter (HOSPITAL_COMMUNITY): Payer: Self-pay | Admitting: Internal Medicine

## 2019-09-07 DIAGNOSIS — G939 Disorder of brain, unspecified: Secondary | ICD-10-CM

## 2019-09-07 LAB — CYTOLOGY - NON PAP

## 2019-09-07 LAB — PATHOLOGIST SMEAR REVIEW

## 2019-09-07 LAB — GLUCOSE, CAPILLARY
Glucose-Capillary: 112 mg/dL — ABNORMAL HIGH (ref 70–99)
Glucose-Capillary: 104 mg/dL — ABNORMAL HIGH (ref 70–99)
Glucose-Capillary: 105 mg/dL — ABNORMAL HIGH (ref 70–99)
Glucose-Capillary: 157 mg/dL — ABNORMAL HIGH (ref 70–99)

## 2019-09-07 NOTE — Consult Note (Signed)
[facility] Consult Note  Patient Care Team: Thea Alken as PCP - General (Nurse Practitioner)  CHIEF COMPLAINTS/PURPOSE OF CONSULTATION:  Right Side Weakness Brain Mass  HISTORY OF PRESENTING ILLNESS:  Renee [name] 74 y.o. female presented with several weeks of right sided weakness.  She describes dragging of the right leg, leading to several falls.  This started during mid December and has progressed somewhat.  She is now relying on a walker for ambulation.  Also describes right arm weakness and clumsiness limiting her ability to use utensils and brush her teeth.  Imaging demonstrated left frontal brain mass.  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  MEDICAL HISTORY:  Past Medical History:  Diagnosis Date  . Chronic renal disease, stage 3, moderately decreased glomerular filtration rate between 30-59 mL/min/1.73 square meter 12/27/2017  . Constipation   . Depression   . History of breast cancer 12/27/2017  . Hypertension   . Leukemia ([place])    CLL  . PVD (peripheral vascular disease) ([place])   . Thyroid disease     SURGICAL HISTORY: Past Surgical History:  Procedure Laterality Date  . ABDOMINAL HYSTERECTOMY    . BREAST SURGERY     Left Breast Lumpectomy  . COLON SURGERY    . HERNIA REPAIR  2000   Hiatal hernia repair      SOCIAL HISTORY: Social History   Socioeconomic History  . Marital status: Widowed    Spouse name: Not on file  . Number of children: Not on file  . Years of education: Not on file  . Highest education level: Not on file  Occupational History  . Not on file  Tobacco Use  . Smoking status: Former Smoker    Packs/day: 0.75    Types: Cigarettes    Start date: 09/19/1962    Quit date: 09/20/1979    Years since quitting: 39.9  . Smokeless tobacco: Never Used  Substance and Sexual Activity  . Alcohol use: No    Alcohol/week: 0.0 standard drinks  . Drug use: No  . Sexual activity: Never  Other Topics  Concern  . Not on file  Social History Narrative  . Not on file   Social Determinants of Health   Financial Resource Strain:   . Difficulty of Paying Living Expenses: Not on file  Food Insecurity:   . Worried About Charity fundraiser in the Last Year: Not on file  . Ran Out of Food in the Last Year: Not on file  Transportation Needs:   . Lack of Transportation (Medical): Not on file  . Lack of Transportation (Non-Medical): Not on file  Physical Activity:   . Days of Exercise per Week: Not on file  . Minutes of Exercise per Session: Not on file  Stress:   . Feeling of Stress : Not on file  Social Connections:   . Frequency of Communication with Friends and Family: Not on file  . Frequency of Social Gatherings with Friends and Family: Not on file  . Attends Religious Services: Not on file  . Active Member of Clubs or Organizations: Not on file  . Attends Archivist Meetings: Not on file  . Marital Status: Not on file  Intimate Partner Violence:   . Fear of Current or Ex-Partner: Not on file  . Emotionally Abused: Not on file  . Physically Abused: Not on file  . Sexually Abused: Not on file    FAMILY HISTORY: Family History  Problem Relation Age of Onset  . Healthy Mother   . Healthy Father   . Healthy Sister        "normal stuff for her age"  . Pancreatic cancer Brother     ALLERGIES:  is allergic to bupropion; clonidine derivatives; codeine; diclofenac-misoprostol; olanzapine; pregabalin; valdecoxib; and zonisamide.  MEDICATIONS:  Current Facility-Administered Medications  Medication Dose Route Frequency Provider Last Rate Last Admin  . acetaminophen (TYLENOL) tablet 650 mg  650 mg Oral Q4H PRN Norins, Heinz Knuckles, MD       Or  . acetaminophen (TYLENOL) 160 MG/5ML solution 650 mg  650 mg Per Tube Q4H PRN Norins, Heinz Knuckles, MD       Or  . acetaminophen (TYLENOL) suppository 650 mg  650 mg Rectal Q4H PRN Norins, Heinz Knuckles, MD      . bisacodyl (DULCOLAX)  suppository 10 mg  10 mg Rectal Daily PRN Sheikh, Omair Latif, DO      . clonazePAM Bobbye [place]) tablet 0.5 mg  0.5 mg Oral BID Neena Rhymes, MD   0.5 mg at 09/07/19 1018  . dexamethasone (DECADRON) tablet 4 mg  4 mg Oral Q8H Emokpae, Courage, MD   4 mg at 09/07/19 1348  . DULoxetine (CYMBALTA) DR capsule 60 mg  60 mg Oral Daily Norins, Heinz Knuckles, MD   60 mg at 09/07/19 1019  . feeding supplement (ENSURE ENLIVE) (ENSURE ENLIVE) liquid 237 mL  237 mL Oral BID BM Sheikh, Omair Latif, DO   237 mL at 09/07/19 1019  . insulin aspart (novoLOG) injection 0-5 Units  0-5 Units Subcutaneous QHS Emokpae, Courage, MD      . insulin aspart (novoLOG) injection 0-6 Units  0-6 Units Subcutaneous TID WC Roxan Hockey, MD   1 Units at 09/06/19 1658  . lactulose (CHRONULAC) 10 GM/15ML solution 10 g  10 g Oral BID Emokpae, Courage, MD   10 g at 09/07/19 1020  . levETIRAcetam (KEPPRA) tablet 250 mg  250 mg Oral BID Norins, Heinz Knuckles, MD   250 mg at 09/07/19 1019  . levothyroxine (SYNTHROID) tablet 100 mcg  100 mcg Oral QAC breakfast Neena Rhymes, MD   100 mcg at 09/07/19 1019  . linaclotide (LINZESS) capsule 290 mcg  290 mcg Oral QAC breakfast Neena Rhymes, MD   290 mcg at 09/07/19 1349  . lurasidone (LATUDA) tablet 40 mg  40 mg Oral QHS Norins, Heinz Knuckles, MD   40 mg at 09/06/19 2335  . methadone (DOLOPHINE) tablet 15 mg  15 mg Oral Daily Norins, Heinz Knuckles, MD   15 mg at 09/07/19 1019   And  . methadone (DOLOPHINE) tablet 10 mg  10 mg Oral q1800 Norins, Heinz Knuckles, MD   10 mg at 09/06/19 1755  . pantoprazole (PROTONIX) EC tablet 40 mg  40 mg Oral BID Norins, Heinz Knuckles, MD   40 mg at 09/07/19 1019  . polyethylene glycol (MIRALAX / GLYCOLAX) packet 17 g  17 g Oral BID Raiford Noble [place], DO   17 g at 09/06/19 2150  . rosuvastatin (CRESTOR) tablet 20 mg  20 mg Oral q1800 Norins, Heinz Knuckles, MD   20 mg at 09/06/19 1755  . senna-docusate (Senokot-S) tablet 1 tablet  1 tablet Oral BID Raiford Noble [place]emore, DO    1 tablet at 09/07/19 1018    REVIEW OF SYSTEMS:   Constitutional: Denies fevers, chills or abnormal weight loss Eyes: Denies blurriness of vision Ears, nose, mouth, throat, and face: Denies mucositis or  sore throat Respiratory: Denies cough, dyspnea or wheezes Cardiovascular: Denies palpitation, chest discomfort or lower extremity swelling Gastrointestinal:  Denies nausea, constipation, diarrhea GU: Denies dysuria or incontinence Skin: Denies abnormal skin rashes Neurological: Per HPI Musculoskeletal: Denies joint pain, back or neck discomfort. No decrease in ROM Behavioral/Psych: Denies anxiety, disturbance in thought content, and mood instability   PHYSICAL EXAMINATION: Vitals:   09/07/19 1145 09/07/19 1615  BP: 116/62 109/71  Pulse: 67 80  Resp: 20 20  Temp: 98.4 F (36.9 C) 98.6 F (37 C)  SpO2: 97% 97%   KPS: 60. General: Alert, cooperative, pleasant, in no acute distress Head: Normal EENT: No conjunctival injection or scleral icterus. Oral mucosa moist Lungs: Resp effort normal Cardiac: Regular rate and rhythm Abdomen: Soft, non-distended abdomen Skin: No rashes cyanosis or petechiae. Extremities: No clubbing or edema  NEUROLOGIC EXAM: Mental Status: Awake, alert, attentive to examiner. Oriented to self and environment. Language is fluent with intact comprehension.  Cranial Nerves: Visual acuity is grossly normal. Visual fields are full. Extra-ocular movements intact. No ptosis. Face is symmetric, tongue midline. Motor: Tone and bulk are normal. Power is 3/5 in right leg, 4/5 in right arm. Reflexes are symmetric, no pathologic reflexes present. Sensory: Intact to light touch and temperature Gait: deferred   LABORATORY DATA:  I have reviewed the data as listed Lab Results  Component Value Date   WBC 80.9 (HH) 09/06/2019   HGB 10.0 (L) 09/06/2019   HCT 33.7 (L) 09/06/2019   MCV 86.9 09/06/2019   PLT 231 09/06/2019   Recent Labs    09/03/19 1716  09/05/19 0757 09/06/19 0551  NA 140 140 140  K 4.0 4.5 4.0  CL 103 108 107  CO2 24 24 25   GLUCOSE 103* 131* 124*  BUN 11 10 15   CREATININE 1.05* 1.04* 0.96  CALCIUM 9.2 8.7* 9.0  GFRNONAA 53* 54* 60*  GFRAA >60 >60 >60  PROT 7.3 5.9* 5.9*  ALBUMIN 4.4 3.6 3.6  AST 24 18 16   ALT 18 15 13   ALKPHOS 74 52 59  BILITOT 1.2 0.7 0.9    RADIOGRAPHIC STUDIES: I have personally reviewed the radiological images as listed and agreed with the findings in the report. EEG  Result Date: 09/05/2019 Alexis Goodell, MD     09/05/2019 11:29 AM ELECTROENCEPHALOGRAM REPORT Patient: Paitin Kallis       Room #: D501236 EEG No. ID: [id number] Age: 74 y.o.        Sex: female Referring Physician: Alfredia Ferguson Report Date:  09/05/2019       Interpreting Physician: Alexis Goodell History: Desani Rudin is an 74 y.o. female with right sided weakness Medications: Klonopin, Decadron, Cymbalta, Insulin, Keppra, Synthroid, Linzess, Latuda, Methadone, Crestor Conditions of Recording:  This is a 21 channel routine scalp EEG performed with bipolar and monopolar montages arranged in accordance to the international 10/20 system of electrode placement. One channel was dedicated to EKG recording. The patient is in the awake state. Description:  The waking background activity consists of a low voltage, symmetrical, fairly well organized, 10 Hz alpha activity, seen from the parieto-occipital and posterior temporal regions.  Low voltage fast activity, poorly organized, is seen anteriorly and is at times superimposed on more posterior regions.  A mixture of theta and alpha rhythms are seen from the central and temporal regions.  Despite the frequently noted artifact over the left hemisphere, this activity is slower over the left parietal region where there are noted underlying slower rhythms that slow the background  in this region The patient does not drowse or sleep. Hyperventilation was not performed.   Intermittent photic stimulation was performed  and elicits a symmetrical driving response but fails to elicit any abnormalities. IMPRESSION: This is an abnormal electroencephalogram secondary to slowing noted in the left parietal region consistent with the patient's radiological findings.  Alexis Goodell, MD Neurology [phone] 09/05/2019, 11:24 AM   DG Chest 2 View  Result Date: 09/03/2019 CLINICAL DATA:  Fall EXAM: CHEST - 2 VIEW COMPARISON:  CT 05/06/2019 FINDINGS: No acute airspace disease, pleural effusion or pneumothorax. Normal heart size. Aortic atherosclerosis. Nodular opacities at the bilateral hila, felt to correspond to adenopathy noted on previous CT. Moderate hiatal hernia. No pneumothorax. Degenerative changes of the spine. IMPRESSION: 1. No active cardiopulmonary disease. 2. Nodular opacities at the hila, felt to correspond to adenopathy noted on previous CT. 3. Moderate hiatal hernia. Electronically Signed   By: Donavan Foil M.D.   On: 09/03/2019 20:06   CT Head Wo Contrast  Result Date: 09/03/2019 CLINICAL DATA:  Recent fall with right leg weakness, initial encounter EXAM: CT HEAD WITHOUT CONTRAST TECHNIQUE: Contiguous axial images were obtained from the base of the skull through the vertex without intravenous contrast. COMPARISON:  None. FINDINGS: Brain: Area of decreased attenuation is noted in the deep white matter of the left parietal lobe near the vertex consistent with subacute ischemia. No findings to suggest acute infarct are seen. No findings to suggest acute hemorrhage or space-occupying mass lesion are noted. Vascular: No hyperdense vessel or unexpected calcification. Skull: Normal. Negative for fracture or focal lesion. Sinuses/Orbits: No acute finding. Other: None. IMPRESSION: Area consistent with prior ischemia within the deep white matter of the left parietal lobe as described. No acute infarct is noted. Electronically Signed   By: Inez Catalina M.D.   On: 09/03/2019 14:50   MR ANGIO HEAD WO CONTRAST  Result Date:  09/03/2019 CLINICAL DATA:  [place] today with right leg weakness. EXAM: MRI HEAD WITHOUT CONTRAST MRA HEAD WITHOUT CONTRAST TECHNIQUE: Multiplanar, multiecho pulse sequences of the brain and surrounding structures were obtained without intravenous contrast. Angiographic images of the head were obtained using MRA technique without contrast. COMPARISON:  Head CT same day FINDINGS: MRI HEAD FINDINGS Brain: The brainstem and cerebellum are normal. Cerebral hemispheres show age related volume loss with some frontal predominance. Right cerebral hemisphere shows minimal small vessel change of the white matter. Left cerebral hemisphere shows mild small vessel change of the white matter. At the left parietal vertex, within the deep white matter, there is a 2 cm in diameter cystic lesion with surrounding low level restricted diffusion and some adjacent vasogenic edema. This does not look like a typical ischemic infarction. No evidence of susceptibility artifact to suggest resolving hematoma. The differential diagnosis includes tumor and cerebritis, both infectious inflammatory and autoimmune. This includes progressive multifocal leukoencephalopathy. I would suggest postcontrast imaging to evaluate this more completely. Vascular: Major vessels at the base of the brain show flow. Skull and upper cervical spine: Negative Sinuses/Orbits: Clear/normal Other: None MRA HEAD FINDINGS Both internal carotid arteries are widely patent into the brain. No siphon stenosis. The anterior and middle cerebral vessels are patent without proximal stenosis, aneurysm or vascular malformation. Both vertebral arteries are widely patent to the basilar. No basilar stenosis. Posterior circulation branch vessels appear normal. IMPRESSION: Normal MR angiography. Findings not suggestive of ischemic infarction. 2 cm cystic lesion in the left parietal deep white matter with regional vasogenic edema and a possible wall showing  some restricted diffusion.  Differential diagnosis is tumor versus cerebritis. Postcontrast imaging would be suggested for more complete evaluation. Electronically Signed   By: Nelson Chimes M.D.   On: 09/03/2019 20:10   MR BRAIN WO CONTRAST  Result Date: 09/03/2019 CLINICAL DATA:  [place] today with right leg weakness. EXAM: MRI HEAD WITHOUT CONTRAST MRA HEAD WITHOUT CONTRAST TECHNIQUE: Multiplanar, multiecho pulse sequences of the brain and surrounding structures were obtained without intravenous contrast. Angiographic images of the head were obtained using MRA technique without contrast. COMPARISON:  Head CT same day FINDINGS: MRI HEAD FINDINGS Brain: The brainstem and cerebellum are normal. Cerebral hemispheres show age related volume loss with some frontal predominance. Right cerebral hemisphere shows minimal small vessel change of the white matter. Left cerebral hemisphere shows mild small vessel change of the white matter. At the left parietal vertex, within the deep white matter, there is a 2 cm in diameter cystic lesion with surrounding low level restricted diffusion and some adjacent vasogenic edema. This does not look like a typical ischemic infarction. No evidence of susceptibility artifact to suggest resolving hematoma. The differential diagnosis includes tumor and cerebritis, both infectious inflammatory and autoimmune. This includes progressive multifocal leukoencephalopathy. I would suggest postcontrast imaging to evaluate this more completely. Vascular: Major vessels at the base of the brain show flow. Skull and upper cervical spine: Negative Sinuses/Orbits: Clear/normal Other: None MRA HEAD FINDINGS Both internal carotid arteries are widely patent into the brain. No siphon stenosis. The anterior and middle cerebral vessels are patent without proximal stenosis, aneurysm or vascular malformation. Both vertebral arteries are widely patent to the basilar. No basilar stenosis. Posterior circulation branch vessels appear normal.  IMPRESSION: Normal MR angiography. Findings not suggestive of ischemic infarction. 2 cm cystic lesion in the left parietal deep white matter with regional vasogenic edema and a possible wall showing some restricted diffusion. Differential diagnosis is tumor versus cerebritis. Postcontrast imaging would be suggested for more complete evaluation. Electronically Signed   By: Nelson Chimes M.D.   On: 09/03/2019 20:10   MR BRAIN W CONTRAST  Result Date: 09/05/2019 CLINICAL DATA:  Follow-up examination for signal abnormality at the left parietal lobe. EXAM: MRI HEAD WITH CONTRAST TECHNIQUE: Multiplanar, multiecho pulse sequences of the brain and surrounding structures were obtained with intravenous contrast. CONTRAST:  74mL GADAVIST GADOBUTROL 1 MMOL/ML IV SOLN COMPARISON:  Prior brain MRI from 09/03/2019. FINDINGS: Brain: Postcontrast imaging only was performed. Following contrast administration, the previously identified 2 cm cystic lesion involving the subcortical white matter of the mesial left parietal lobe does not demonstrate any significant intrinsic enhancement. No other abnormal enhancement seen elsewhere within the brain. Vascular: Normal intravascular enhancement seen throughout the major intracranial vasculature. Skull and upper cervical spine: Craniocervical junction within normal limits. Bone marrow signal intensity normal. No scalp soft tissue abnormality. Sinuses/Orbits: Globes and orbital soft tissues within normal limits. Paranasal sinuses are largely clear. No mastoid effusion. Other: None. IMPRESSION: 1. 2 cm cystic lesion involving the subcortical white matter of the left parietal lobe demonstrates no intrinsic enhancement. 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 Cerebritis or subacute infarction felt to be unlikely as these with expected to demonstrate associated enhancement. Short interval follow-up MRI in 3 months to document stability is recommended. 2. No other abnormal  enhancement elsewhere within the brain. Electronically Signed   By: Jeannine Boga M.D.   On: 09/05/2019 01:14   ECHOCARDIOGRAM COMPLETE  Result Date: 09/04/2019   ECHOCARDIOGRAM REPORT   Patient Name:  [name] Date of Exam: 09/04/2019 Medical Rec #:  [id number]    Height:       73.0 in Accession #:    [id number]   Weight:       184.0 lb Date of Birth:  October 11, 1945    BSA:          2.08 m Patient Age:    5 years     BP:           108/53 mmHg Patient Gender: F            HR:           67 bpm. Exam Location:  [facility] Procedure: 2D Echo Indications:    Stroke 434.91 / I163.9  History:        Patient has no prior history of Echocardiogram examinations.                 Risk Factors:Former Smoker and Hypertension. History of breast                 cancer, Chronic Renal Disease, GERD.  Sonographer:    Leavy Cella RDCS (AE) Referring Phys: [place]  1. Left ventricular ejection fraction, by visual estimation, is 70 to 75%. The left ventricle has hyperdynamic function. There is no left ventricular hypertrophy.  2. Left ventricular diastolic parameters are indeterminate.  3. The left ventricle has no regional wall motion abnormalities.  4. Global right ventricle has normal systolic function.The right ventricular size is normal. No increase in right ventricular wall thickness.  5. Left atrial size was normal.  6. Right atrial size was normal.  7. Presence of pericardial fat pad.  8. The mitral valve is grossly normal. No evidence of mitral valve regurgitation.  9. The tricuspid valve is grossly normal. 10. The aortic valve is tricuspid. Aortic valve regurgitation is not visualized. 11. The pulmonic valve was grossly normal. Pulmonic valve regurgitation is trivial. 12. TR signal is inadequate for assessing pulmonary artery systolic pressure. 13. The inferior vena cava is normal in size with greater than 50% respiratory variability, suggesting right atrial pressure of 3 mmHg. FINDINGS   Left Ventricle: Left ventricular ejection fraction, by visual estimation, is 70 to 75%. The left ventricle has hyperdynamic function. The left ventricle has no regional wall motion abnormalities. There is no left ventricular hypertrophy. Left ventricular diastolic parameters are indeterminate. Right Ventricle: The right ventricular size is normal. No increase in right ventricular wall thickness. Global RV systolic function is has normal systolic function. Left Atrium: Left atrial size was normal in size. Right Atrium: Right atrial size was normal in size Pericardium: There is no evidence of pericardial effusion. Presence of pericardial fat pad. Mitral Valve: The mitral valve is grossly normal. No evidence of mitral valve regurgitation. Tricuspid Valve: The tricuspid valve is grossly normal. Tricuspid valve regurgitation is trivial. Aortic Valve: The aortic valve is tricuspid. Aortic valve regurgitation is not visualized. Pulmonic Valve: The pulmonic valve was grossly normal. Pulmonic valve regurgitation is trivial. Pulmonic regurgitation is trivial. Aorta: The aortic root is normal in size and structure. Venous: The inferior vena cava is normal in size with greater than 50% respiratory variability, suggesting right atrial pressure of 3 mmHg. IAS/Shunts: No atrial level shunt detected by color flow Doppler.  LEFT VENTRICLE PLAX 2D LVIDd:         3.75 cm  Diastology LVIDs:         2.13 cm  LV e' lateral:  8.92 cm/s LV PW:         0.98 cm  LV E/e' lateral: 8.7 LV IVS:        0.96 cm  LV e' medial:    11.50 cm/s LVOT diam:     2.20 cm  LV E/e' medial:  6.8 LV SV:         45 ml LV SV Index:   21.71 LVOT Area:     3.80 cm  RIGHT VENTRICLE RV S prime:     12.40 cm/s TAPSE (M-mode): 2.2 cm LEFT ATRIUM             Index       RIGHT ATRIUM           Index LA diam:        2.60 cm 1.25 cm/m  RA Area:     11.20 cm LA Vol (A2C):   44.3 ml 21.33 ml/m RA Volume:   27.00 ml  13.00 ml/m LA Vol (A4C):   32.8 ml 15.79 ml/m LA  Biplane Vol: 40.5 ml 19.50 ml/m   AORTA Ao Root diam: 2.80 cm MITRAL VALVE MV Area (PHT): 3.37 cm             SHUNTS MV PHT:        65.25 msec           Systemic Diam: 2.20 cm MV Decel Time: 225 msec MV E velocity: 77.70 cm/s 103 cm/s MV A velocity: 83.20 cm/s 70.3 cm/s MV E/A ratio:  0.93       1.5  Rozann Lesches MD Electronically signed by Rozann Lesches MD Signature Date/Time: 09/04/2019/1:53:34 PM    Final    DG FLUORO GUIDE LUMBAR PUNCTURE  Result Date: 09/06/2019 CLINICAL DATA:  Right leg weakness.  Cerebritis.  Brain mass EXAM: DIAGNOSTIC LUMBAR PUNCTURE UNDER FLUOROSCOPIC GUIDANCE FLUOROSCOPY TIME:  Radiation Exposure Index (as provided by the fluoroscopic device): 17.9 If the device does not provide the exposure index: Fluoroscopy Time (in minutes and seconds):  48 seconds Number of Acquired Images:  0 PROCEDURE: Informed consent was obtained from the patient prior to the procedure, including potential complications of headache, allergy, and pain. With the patient prone, the lower back was prepped with Betadine. 1% Lidocaine was used for local anesthesia. Lumbar puncture was performed at the L2-3 level using a 22 gauge needle with return of clear CSF with an opening pressure of 10 cm water. 13.5 ml of CSF were obtained for laboratory studies. The patient tolerated the procedure well and there were no apparent complications. IMPRESSION: Technically successful lumbar puncture with removal of 13.5 cc of clear CSF. Opening pressure was 10 cm of water. Electronically Signed   By: Kerby Moors M.D.   On: 09/06/2019 15:37    ASSESSMENT & PLAN:  Left Frontal Brain Lesion  Etiology of the lesion of interest is of unclear etiology.  Clinically significant involvement of the CNS in CLL is very uncommon.  There is not a typical radiographic appearance of high grade glioma, and clinical course is not consistent with indolent or low grade glioma.  It is also possible there is non-neoplastic etiology here,  such as vascular or inflammatory process.    We recommend repeating contrast enhanced brain MRI in 1 month.  Will defer brain biopsy for now.   Decadron can be reduced to 4mg  daily at discharge.  Encourage aggressive rehabilitation as planned.  Our office will arrange follow up imaging and clinic visit.  Case will  be discussed further in brain/spine tumor board meeting on 09/14/19.  All questions were answered. The patient knows to call the clinic with any problems, questions or concerns.  The total time spent in the encounter was 55 minutes and more than 50% was on counseling and review of test results     Ventura Sellers, MD 09/07/2019 5:04 PM

## 2019-09-07 NOTE — Care Management Important Message (Signed)
Important Message  Patient Details  Name: Renee [name] MRN: [id number] Date of Birth: [birth year]   Medicare Important Message Given:  Yes     Orbie Pyo 09/07/2019, 3:36 PM

## 2019-09-07 NOTE — Progress Notes (Addendum)
PROGRESS NOTE  Rumor Sirman [id number] DOB: Mar 04, 1946 DOA: 09/03/2019 PCP: Ephriam Jenkins E   LOS: 4 days   Brief narrative: As per HPI, Shandelle Blairis a 74 y.o.femalewith medical history significant ofstage I CLL, and a neuralgia followed at the [facility] pain clinic, hypertension, CKD 3, thyroid disease. Patient reports that several weeks ago she started to develop increasing paresthesia in her right foot. She does have a history of peripheral neuropathy to which she attributed her symptoms. Over the intervening period of timeshe had progressive paresthesia in the right leg and increasing weakness. She reports that she has had multiple falls due to poor balance.She has had no loss of vision. Had no severe headache. She has had no tremor. She had no fevers or chills. Because of her progressive weakness and falling she presents to the [facility] emergency department for evaluation  ED Course:Patient is hemodynamically stable in the emergency department. Her evaluation did reveal some right-sided weakness and ataxia. CT scan of the head revealed a left parietal abnormality.MRI/MRA reveals 2 cm cystic lesion deep left parietal region with edema, no vascular occlusion.She is referred to [facility] for admission work-up forcystic brain lesion.  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 MRI showed 2 cm cystic lesion involving the subcortical white matter of the left parietal lobe demonstrates no intrinsic enhancement.  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   Neuro oncology has been consulted will follow recommendation.  Continue Keppra Decadron.  Abnormal EEG.  Seen by physical therapy who recommend CIR.  Status post spinal tap.  No evidence of infection.  Follow neurology and neuro-oncology recommendations.  Hyperglycemia while on steroids.  Continue sliding scale insulin.  Closely monitor blood glucose levels.  Last POC glucose was 112  Stage I CLL -WBC at 80.9.  Patient follows up with Dr. Delton [facility]omb[facility]. Baseline WBC is around 80,000 per patient.  Watch closely on steroids.  Peripheral Neuropathy/Pudendal Neuralgia/Chronic Pain -Continue home pain regimen with methadone and morphine   History of Breast Cancer -Apparently in remission since 1999.  Hypothyroidism continue levothyroxine 100 mcg daily.  TSH of 2.0.  Depressive Disorder with Anxiety  -Stable. Continue Lurasidone, Duloxetine and Clonazepam   CKD III -Stable at this time, creatinine of 0.9  Hyperlipidemia -continue rosuvastatin   IBS/Constipation -Continue Lactulose Linaclotide, MiraLAX, Senna-Docusate   Normocytic Anemia -hemoglobin of 10.0 today.  Will closely monitor  GERD continue pantoprazole   VTE Prophylaxis: SCDs  Code Status: DNR  Family Communication:  I spoke with the patient' daughter Ms [name] on the phone and updated her about the clinical condition of the patient.   Disposition Plan: PT recommended CIR. Rehab has been consulted.  Consultants:  Neurology,   neuro oncology  CIR  Procedures:  Lumbar puncture  Antibiotics:  Anti-infectives (From admission, onward)   None     Subjective: Today, patient denies any headache, nausea or vomiting.  Does have right lower extremity weakness.  No shortness of breath, chest pain.  Has had bowel movements  Objective: Vitals:   09/07/19  0418 09/07/19 0743  BP: 103/63 (!) 110/54  Pulse: 70 69  Resp: 16 16   Temp: 97.8 F (36.6 C) 98.4 F (36.9 C)  SpO2: 98% 96%    Intake/Output Summary (Last 24 hours) at 09/07/2019 1119 Last data filed at 09/06/2019 1809 Gross per 24 hour  Intake 120 ml  Output 500 ml  Net -380 ml   Filed Weights   09/03/19 1302  Weight: 83.5 kg   Body mass index is 24.28 kg/m.   Physical Exam: GENERAL: Patient is alert awake and oriented. Not in obvious distress. HENT: No scleral pallor or icterus. Pupils equally reactive to light. Oral mucosa is moist NECK: is supple, no palpable thyroid enlargement. CHEST: Clear to auscultation. No crackles or wheezes. Non tender on palpation. Diminished breath sounds bilaterally. CVS: S1 and S2 heard, no murmur. Regular rate and rhythm. No pericardial rub. ABDOMEN: Soft, non-tender, bowel sounds are present. EXTREMITIES: No edema. CNS: Cranial nerves are intact.  Right lower extremity weakness.  Mild right upper extremity handgrip weakness. SKIN: warm and dry without rashes.  Data Review: I have personally reviewed the following laboratory data and studies,  CBC: Recent Labs  Lab 09/03/19 1716 09/05/19 0757 09/06/19 0551  WBC 103.5* 74.1* 80.9*  NEUTROABS 5.3 6.6 7.4  HGB 11.2* 9.7* 10.0*  HCT 39.3 32.5* 33.7*  MCV 89.5 86.9 86.9  PLT 266 212 [id number]   Basic Metabolic Panel: Recent Labs  Lab 09/03/19 1716 09/05/19 0757 09/06/19 0551  NA 140 140 140  K 4.0 4.5 4.0  CL 103 108 107  CO2 24 24 25   GLUCOSE 103* 131* 124*  BUN 11 10 15   CREATININE 1.05* 1.04* 0.96  CALCIUM 9.2 8.7* 9.0  MG  --  2.2 2.3  PHOS  --  4.2 5.1*   Liver Function Tests: Recent Labs  Lab 09/03/19 1716 09/05/19 0757 09/06/19 0551  AST 24 18 16   ALT 18 15 13   ALKPHOS 74 52 59  BILITOT 1.2 0.7 0.9  PROT 7.3 5.9* 5.9*  ALBUMIN 4.4 3.6 3.6   No results for input(s): LIPASE, AMYLASE in the last 168 hours. No results for input(s): AMMONIA in the last 168 hours. Cardiac Enzymes: No results for input(s): CKTOTAL, CKMB, CKMBINDEX,  TROPONINI in the last 168 hours. BNP (last 3 results) No results for input(s): BNP in the last 8760 hours.  ProBNP (last 3 results) No results for input(s): PROBNP in the last 8760 hours.  CBG: Recent Labs  Lab 09/06/19 0621 09/06/19 1152 09/06/19 1618 09/06/19 2112 09/07/19 0618  GLUCAP 126* 129* 175* 165* 112*   Recent Results (from the past 240 hour(s))  SARS CORONAVIRUS 2 (TAT 6-24 HRS) Nasopharyngeal Nasopharyngeal Swab     Status: None   Collection Time: 09/03/19  9:04 PM   Specimen: Nasopharyngeal Swab  Result Value Ref Range Status   SARS Coronavirus 2 NEGATIVE NEGATIVE Final    Comment: (NOTE) SARS-CoV-2 target nucleic acids are NOT DETECTED. The SARS-CoV-2 RNA is generally detectable in upper and lower respiratory specimens during the acute phase of infection. Negative results do not preclude SARS-CoV-2 infection, do not rule out co-infections with other pathogens, and should not be used as the sole basis for treatment or other patient management decisions. Negative results must be combined with clinical observations, patient history, and epidemiological information. The expected result is Negative. Fact Sheet for Patients: SugarRoll.be Fact Sheet for Healthcare Providers: https://www.woods-mathews.com/ This test is not yet approved or cleared by the [place] FDA and  has been authorized for detection and/or diagnosis of SARS-CoV-2 by FDA under an Emergency Use Authorization (EUA). This EUA will remain  in effect (meaning this test can be used) for the duration of the COVID-19 declaration under Section 56 4(b)(1) of the Act, 21 U.S.C. section 360bbb-3(b)(1), unless the authorization is terminated or revoked sooner. Performed at [facility], [place] [address] [place], [place] [address]   CSF culture     Status: None (Preliminary result)   Collection Time: 09/06/19 11:00 AM   Specimen: PATH Cytology CSF;  Cerebrospinal Fluid  Result Value Ref Range Status   Specimen Description CSF  Final   Special Requests NONE  Final   Gram Stain   Final    NO WBC SEEN NO ORGANISMS SEEN CYTOSPIN SMEAR Performed at [facility], [place] [address], [place], [place] [address]    Culture PENDING  Incomplete   Report Status PENDING  Incomplete     Studies: EEG  Result Date: 09/05/2019 Alexis Goodell, MD     09/05/2019 11:29 AM ELECTROENCEPHALOGRAM REPORT Patient: Anabela Manginelli       Room #: [id number] EEG No. ID: [id number] Age: 74 y.o.        Sex: female Referring Physician: Alfredia Ferguson Report Date:  09/05/2019       Interpreting Physician: Alexis Goodell History: Maeghan Grape is an 74 y.o. female with right sided weakness Medications: Klonopin, Decadron, Cymbalta, Insulin, Keppra, Synthroid, Linzess, Latuda, Methadone, Crestor Conditions of Recording:  This is a 21 channel routine scalp EEG performed with bipolar and monopolar montages arranged in accordance to the international 10/20 system of electrode placement. One channel was dedicated to EKG recording. The patient is in the awake state. Description:  The waking background activity consists of a low voltage, symmetrical, fairly well organized, 10 Hz alpha activity, seen from the parieto-occipital and posterior temporal regions.  Low voltage fast activity, poorly organized, is seen anteriorly and is at times superimposed on more posterior regions.  A mixture of theta and alpha rhythms are seen from the central and temporal regions.  Despite the frequently noted artifact over the left hemisphere, this activity is slower over the left parietal region where there are noted underlying slower rhythms that slow the background in this region The patient does not drowse or sleep. Hyperventilation was not performed.   Intermittent photic stimulation was performed and elicits a symmetrical driving response but fails to elicit any abnormalities. IMPRESSION: This is an abnormal  electroencephalogram secondary to slowing noted in the left parietal region consistent with the patient's radiological findings.  Alexis Goodell, MD Neurology [phone] 09/05/2019, 11:24 AM   DG FLUORO GUIDE LUMBAR PUNCTURE  Result Date: 09/06/2019 CLINICAL DATA:  Right leg weakness.  Cerebritis.  Brain mass EXAM: DIAGNOSTIC LUMBAR PUNCTURE UNDER FLUOROSCOPIC GUIDANCE FLUOROSCOPY TIME:  Radiation Exposure Index (as provided by the fluoroscopic device): 17.9 If the device does not provide the exposure index: Fluoroscopy Time (in minutes and seconds):  48 seconds Number of Acquired Images:  0 PROCEDURE: Informed consent was obtained from the patient prior to the procedure, including potential complications of headache, allergy, and pain. With the patient prone, the lower back was prepped with Betadine. 1% Lidocaine was used for local anesthesia. Lumbar puncture was performed at the L2-3 level using a 22 gauge needle with return of clear CSF with an opening pressure of 10 cm water. 13.5 ml of CSF were obtained for laboratory studies. The patient tolerated the procedure well and there were no  apparent complications. IMPRESSION: Technically successful lumbar puncture with removal of 13.5 cc of clear CSF. Opening pressure was 10 cm of water. Electronically Signed   By: Kerby Moors M.D.   On: 09/06/2019 15:37    Scheduled Meds: . clonazePAM  0.5 mg Oral BID  . dexamethasone  4 mg Oral Q8H  . DULoxetine  60 mg Oral Daily  . feeding supplement (ENSURE ENLIVE)  237 mL Oral BID BM  . insulin aspart  0-5 Units Subcutaneous QHS  . insulin aspart  0-6 Units Subcutaneous TID WC  . lactulose  10 g Oral BID  . levETIRAcetam  250 mg Oral BID  . levothyroxine  100 mcg Oral QAC breakfast  . linaclotide  290 mcg Oral QAC breakfast  . lurasidone  40 mg Oral QHS  . methadone  15 mg Oral Daily   And  . methadone  10 mg Oral q1800  . pantoprazole  40 mg Oral BID  . polyethylene glycol  17 g Oral BID  .  rosuvastatin  20 mg Oral q1800  . senna-docusate  1 tablet Oral BID    Continuous Infusions:   Flora Lipps, MD  [facility] 09/07/2019

## 2019-09-07 NOTE — Progress Notes (Signed)
Physical Therapy Treatment Patient Details Name: Schuyler Halperin MRN: [id number] DOB: 07-Feb-1946 Today's Date: 09/07/2019    History of Present Illness 74 yo female presenting to [facility] ED after a fall reporting her right leg gave out. CT at [facility] showing L parietal lobe hypodensity. 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 PMHx of stage I CLL, neuralgia, HTN, CKD 3 and thyroid disease    PT Comments    Pt progressing with functional mobility today and able to tolerate increased transfer training and right neuro muscular re-education. Pt transferred to sitting EOB with min assist and donned her shoes with supervision, in sitting cues for awareness of R lateral lean in order for her to correct and return to midline. Pt performed multiple sit<>stands today with and without RW, mod-max assist. Pt worked on R knee control and dynamic balance this session to perform mini squats, mod assist from therapist with cues for techniques. Pt performed pre-gait, stepping in place with each LE while using RW for UE support, mod assist. Pt able to perform stand pivot to the recliner today with max assist. Pt continues to be appropriate candidate for CIR follow up therapies in order to maximize functional independence with mobility, progress gait training and address balance impairments.     Follow Up Recommendations  CIR     Equipment Recommendations  Other (comment)(tbd next venue)    Recommendations for Other Services       Precautions / Restrictions Precautions Precautions: Fall Precaution Comments: Significant weakness at RLE, impaired R UE proprioception Restrictions Weight Bearing Restrictions: No    Mobility  Bed Mobility Overal bed mobility: Needs Assistance Bed Mobility: Supine to Sit     Supine to sit: Min assist     General bed mobility comments: Pt able to bring BLEs towards EOB and then using therpaist hand to pull into sitting position  with Min A  Transfers Overall transfer level: Needs assistance   Transfers: Sit to/from Stand;Stand Pivot Transfers Sit to Stand: Max assist;Mod assist Stand pivot transfers: Max assist       General transfer comment: Max assist for sit<>stand without assistive device, mod assist for sit<>stand with use of RW. Max assist for stand pivot to the recliner with cues for techniques and hand placement. Performed x 4 mini squats this session with RW and mod assist, working on R knee control and strength, pt with posterior lean.  Ambulation/Gait Ambulation/Gait assistance: Max assist;Mod assist           General Gait Details: pt performed pre-gait this session with UE support on RW and mod assist, pt keeps R knee hyperextending during stance when stepping with L LE (cues and facilitation to limit this). Pt requires cues and facilitation for R foot placement.   Stairs             Wheelchair Mobility    Modified Rankin (Stroke Patients Only) Modified Rankin (Stroke Patients Only) Pre-Morbid Rankin Score: Moderately severe disability Modified Rankin: Moderately severe disability     Balance Overall balance assessment: Needs assistance Sitting-balance support: No upper extremity supported;Feet supported Sitting balance-Leahy Scale: Poor Sitting balance - Comments: intermittent CGA-min assist needed at times to correct R lateral lean and return to midline, cues for awareness. Postural control: Right lateral lean Standing balance support: Bilateral upper extremity supported;During functional activity;No upper extremity supported Standing balance-Leahy Scale: Poor Standing balance comment: Reliant on physical A and UE support  Cognition Arousal/Alertness: Awake/alert Behavior During Therapy: WFL for tasks assessed/performed Overall Cognitive Status: Impaired/Different from baseline Area of Impairment: Attention;Awareness                    Current Attention Level: Sustained       Awareness: Intellectual;Anticipatory   General Comments: pt with decreased attention to and awareness of R side during mobility, cues for attention to R UE. Decreased awareness of R lateral lean at times, cues to correct      Exercises      General Comments        Pertinent Vitals/Pain Pain Assessment: No/denies pain    Home Living                      Prior Function            PT Goals (current goals can now be found in the care plan section) Progress towards PT goals: Progressing toward goals    Frequency    Min 4X/week      PT Plan      Co-evaluation              AM-PAC PT "6 Clicks" Mobility   Outcome Measure  Help needed turning from your back to your side while in a flat bed without using bedrails?: A Little Help needed moving from lying on your back to sitting on the side of a flat bed without using bedrails?: A Little Help needed moving to and from a bed to a chair (including a wheelchair)?: A Lot Help needed standing up from a chair using your arms (e.g., wheelchair or bedside chair)?: A Lot   Help needed climbing 3-5 steps with a railing? : Total 6 Click Score: 11    End of Session Equipment Utilized During Treatment: Gait belt Activity Tolerance: Patient tolerated treatment well Patient left: in chair;with call bell/phone within reach;with chair alarm set Nurse Communication: Mobility status PT Visit Diagnosis: Unsteadiness on feet (R26.81);Other abnormalities of gait and mobility (R26.89);History of falling (Z91.81);Difficulty in walking, not elsewhere classified (R26.2)     Time: [id number] PT Time Calculation (min) (ACUTE ONLY): 24 min  Charges:  $Therapeutic Activity: 8-22 mins $Neuromuscular Re-education: 8-22 mins                     Netta Corrigan, PT, DPT, [place] [facility] [place] 09/07/2019, 1:55 PM

## 2019-09-07 NOTE — Consult Note (Signed)
Physical Medicine and Rehabilitation Consult  Reason for Consult: Functional decline --work up underway.  Referring Physician: Dr. Alfredia Ferguson   HPI: Renee [name] is a 74 y.o. female with history of CKD, HTN, CLL,left breast cancer s/p lumpectomy with XRT '99, neuralgia, chronic rectal pain, anxiety disorder, PVD who was admitted on 09/04/19 with 2-3 week history of multiple falls with progressive weakness affecting [place] or right hand and right foot drop. MRI brain done showing 2 cm cystic lesion with vasogenic edema and question of tumor v/s cerebritis. She was transferred to [facility] for further workup. She was evaluated by neurology, started on decadron and MRI brain with contrast 2 cm subcortical white matter lesion in parietal lobe without enhancement --cerebritis or subacute infarct felt to be unlikely. UDS negative. LP done for work up showing elevated glucose and 1 WBC with 89 RBC. Dr. Rory Percy recommends biopsy with PET scan and follow up with Dr. Marijo Sanes be done on outpatient basis.  Two weeks ago patient hired aide and required walker due to falls/inability to care for herself. Was independent without AD prior to that.  Therapy evaluations completed revealing decreased balance, right lateral lean and right sided weakness affecting functional status. CIR recommended for follow up therapy.    Review of Systems  Constitutional: Negative for chills and fever.  HENT: Negative for hearing loss and tinnitus.   Eyes: Negative for blurred vision and double vision.  Respiratory: Positive for shortness of breath (DOE). Negative for wheezing.   Cardiovascular: Negative for chest pain, palpitations and leg swelling.  Gastrointestinal: Positive for constipation. Negative for heartburn and nausea.  Musculoskeletal: Positive for falls.  Skin: Negative for itching and rash.  Neurological: Positive for sensory change (in feet), focal weakness and weakness (right foot weakness).  Psychiatric/Behavioral:  Positive for depression. The patient is nervous/anxious. The patient does not have insomnia.      Past Medical History:  Diagnosis Date  . Chronic renal disease, stage 3, moderately decreased glomerular filtration rate between 30-59 mL/min/1.73 square meter 12/27/2017  . Constipation   . Depression   . History of breast cancer 12/27/2017  . Hypertension   . 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 PVD (peripheral vascular disease) ([place])   . Thyroid disease     Past Surgical History:  Procedure Laterality Date  . ABDOMINAL HYSTERECTOMY    . BREAST SURGERY     Left Breast Lumpectomy  . COLON SURGERY    . HERNIA REPAIR  2000   Hiatal hernia repair     Family History  Problem Relation Age of Onset  . Healthy Mother   . Healthy Father   . Healthy Sister        "normal stuff for her age"  . Pancreatic cancer Brother      Social History:  Reports that son stays with her and does some home management.  She was independent and driving prior to 3 weeks ago.  She reports that she quit smoking about in 1981.  She started smoking about 57 years ago. She smoked 0.75 packs per day. She has never used smokeless tobacco. She reports that she does not drink alcohol or use drugs.    Allergies  Allergen Reactions  . Bupropion Other (See Comments)    Fidgety   . Clonidine Derivatives Other (See Comments)  . Codeine Nausea Only  . Diclofenac-Misoprostol Other (See Comments)    Fidgety   . Olanzapine Other (See Comments)    Fidgety   .  Pregabalin Other (See Comments) and Rash  . Valdecoxib Other (See Comments)  . Zonisamide Other (See Comments)    Fidgety     Medications Prior to Admission  Medication Sig Dispense Refill  . clonazePAM (KLONOPIN) 0.5 MG tablet Take 0.5 mg by mouth 2 (two) times daily.     . DULoxetine (CYMBALTA) 60 MG capsule Take 60 mg by mouth daily.    [name] Kitchen ENULOSE 10 GM/15ML SOLN TAKE 15 MLS (10 G TOTAL) BY MOUTH TWO (2) (TWO) TIMES DAILY AS NEEDED FOR MILD CONSTIPATION OR SEVERE  CONSTIPATION.  236 mL 0  . KRILL OIL PO Take 900 mg by mouth daily.    [name] Kitchen levothyroxine (SYNTHROID, LEVOTHROID) 100 MCG tablet Take 100 mcg by mouth daily before breakfast.    . linaclotide (LINZESS) 290 MCG CAPS capsule Take 1 capsule (290 mcg total) by mouth daily before breakfast. 30 capsule 1  . LINZESS 72 MCG capsule     . lurasidone (LATUDA) 40 MG TABS tablet Take 40 mg by mouth daily with breakfast.    . methadone (DOLOPHINE) 10 MG tablet 1.5 tabs ( 15 mg ) bid and 1 tab at HS, 4 per day    . pantoprazole (PROTONIX) 40 MG tablet Take 40 mg by mouth daily.    . polyethylene glycol (MIRALAX / GLYCOLAX) packet Take 17 g by mouth daily.    . rosuvastatin (CRESTOR) 20 MG tablet Take 20 mg by mouth daily.    [name] Kitchen senna-docusate (SENOKOT-S) 8.6-50 MG tablet Take 1 tablet by mouth 2 (two) times daily. 60 tablet 3  . Calcium Carbonate-Vitamin D (CALCIUM HIGH POTENCY/VITAMIN D) 600-200 MG-UNIT TABS Take by mouth daily.     [name] Kitchen lactulose (CHRONULAC) 10 GM/15ML solution Take 15 mLs (10 g total) by mouth 4 (four) times daily as needed for mild constipation or severe constipation. 236 mL 5  . lubiprostone (AMITIZA) 8 MCG capsule Take 1 capsule (8 mcg total) by mouth 2 (two) times daily with a meal. 60 capsule 0  . morphine (MSIR) 15 MG tablet Take 15 mg by mouth 4 (four) times daily as needed.    . pantoprazole (PROTONIX) 20 MG tablet Take 1 tablet (20 mg total) by mouth 2 (two) times daily. 60 tablet 1    Home: Home Living Family/patient expects to be discharged to:: Private residence Living Arrangements: Children(son) Available Help at Discharge: Family, Available PRN/intermittently Type of Home: House Home Access: Stairs to enter [employer] of Steps: 2 Home Layout: Able to live on main level with bedroom/bathroom Bathroom Shower/Tub: Chiropodist: Handicapped height Home Equipment: Bedside commode, Environmental consultant - 2 wheels, Wheelchair - manual  Functional History: Prior  Function Level of Independence: Needs assistance Gait / Transfers Assistance Needed: using walker, frequent falls ADL's / Homemaking Assistance Needed: Been doing sponge baths. Aide coming on Tu/Th to help with bathing, dressing, cooking/cleaning, laundry. Pt has required increased assist with ADL's over past week in setting of frequent falling. Functional Status:  Mobility: Bed Mobility Overal bed mobility: Needs Assistance Bed Mobility: Supine to Sit Supine to sit: Min assist General bed mobility comments: Pt able to bring BLEs towards EOB and then using therpaist hand to pull into sitting position with Min A Transfers Overall transfer level: Needs assistance Equipment used: Rolling walker (2 wheeled) Transfers: Sit to/from Stand Sit to Stand: Mod assist, +2 physical assistance General transfer comment: Mod A +2 to power up into standing and then main balance. Pt with tendency for right lateral lean Ambulation/Gait  Ambulation/Gait assistance: Mod assist, +2 physical assistance Gait Distance (Feet): 10 Feet Assistive device: Rolling walker (2 wheeled) Gait Pattern/deviations: Step-through pattern, Decreased stride length, Decreased stance time - right, Decreased dorsiflexion - right, Decreased weight shift to right General Gait Details: Pt requiring modA + 2 for stability, heavy reliance through arms. Cues for sequencing, upright posture. Pt with erratic right step length/width, right lateral lean, decreased foot clearance, decreased heel strike at initial contact Gait velocity: decreasecd Gait velocity interpretation: <1.8 ft/sec, indicate of risk for recurrent falls    ADL: ADL Overall ADL's : Needs assistance/impaired Eating/Feeding: Set up, Sitting Grooming: Oral care, Maximal assistance, Standing, Brushing hair, Sitting, Min guard Grooming Details (indicate cue type and reason): Pt requiring Max A for standing balance while at sink performing oral care. Pt with significant lean  to right and required cues for correction. Decreased coorindation and grasp strength at RUE as she managed tooth brush and tooth paste. Able to brush her hair while seated in recliner Upper Body Bathing: Minimal assistance, Sitting Lower Body Bathing: Moderate assistance, +2 for physical assistance, Sit to/from stand Upper Body Dressing : Minimal assistance, Sitting Lower Body Dressing: Moderate assistance, +2 for safety/equipment, Sit to/from stand Lower Body Dressing Details (indicate cue type and reason): Pt donning her left shoe with Mod A for postural control and sitting balance (lateral leaning to right). Requriing assistance to bring right ankle to knee and then Mod A for sitting balance when donning right shoe.  Toilet Transfer: Moderate assistance, +2 for physical assistance, Ambulation, BSC, RW Toilet Transfer Details (indicate cue type and reason): Mod A for power up and to maintain balance during mobility. Cues for hand placement at [place] and Hygiene: Moderate assistance, +2 for physical assistance, Sit to/from stand Toileting - Clothing Manipulation Details (indicate cue type and reason): Pt able to perform peri care in standing with Mod A +2 for standing balance Functional mobility during ADLs: Moderate assistance, +2 for physical assistance, Rolling walker General ADL Comments: Pt presenting with decreased balance, strength, and coorindation at RUE/RLE.   Cognition: Cognition Overall Cognitive Status: Within Functional Limits for tasks assessed Orientation Level: Oriented X4 Cognition Arousal/Alertness: Awake/alert Behavior During Therapy: WFL for tasks assessed/performed, Anxious Overall Cognitive Status: Within Functional Limits for tasks assessed General Comments: Pt verbalizing fear of falling and anxious about performing mobility.   Blood pressure (!) 110/54, pulse 69, temperature 98.4 F (36.9 C), temperature source Oral, resp. rate 16,  height 6\' 1"  (1.854 m), weight 83.5 kg, SpO2 96 %. Physical Exam  Constitutional: She is oriented to person, place, and time. She appears well-developed and well-nourished. No distress.  HENT:  Head: Normocephalic and atraumatic.  Facial flushing  Eyes: Pupils are equal, round, and reactive to light. Conjunctivae and EOM are normal.  Neck: No JVD present.  Cardiovascular: Normal rate, regular rhythm and normal heart sounds.  No murmur heard. Respiratory: Effort normal and breath sounds normal. No stridor. No respiratory distress. She has no wheezes.  GI: Soft. Bowel sounds are normal. She exhibits no distension. There is no abdominal tenderness.  Musculoskeletal:        General: No deformity or edema.  Neurological: She is alert and oriented to person, place, and time. Coordination abnormal.  Motor strength  3- Right deltoid, 3/5 Bi tri finger flex and ext  3- Righ thip flexion, 4/5 R Knee ext, 2- Right ankle DF/  Left side  4/5  In LUE and LLE  Sensation reduced to LT in  bilateral feet, intact in hands    Skin: Skin is warm and dry. She is not diaphoretic.  Psychiatric: She has a normal mood and affect. Her behavior is normal. Judgment and thought content normal.    Results for orders placed or performed during the hospital encounter of 09/03/19 (from the past 24 hour(s))  CSF culture     Status: None (Preliminary result)   Collection Time: 09/06/19 11:00 AM   Specimen: PATH Cytology CSF; Cerebrospinal Fluid  Result Value Ref Range   Specimen Description CSF    Special Requests NONE    Gram Stain      NO WBC SEEN NO ORGANISMS SEEN CYTOSPIN SMEAR Performed at [facility], [place] [address], [place], [place] [address]    Culture PENDING    Report Status PENDING   Glucose, CSF     Status: Abnormal   Collection Time: 09/06/19 11:17 AM  Result Value Ref Range   Glucose, CSF 79 (H) 40 - 70 mg/dL  Protein, CSF     Status: None   Collection Time: 09/06/19 11:17 AM    Result Value Ref Range   Total  Protein, CSF 27 15 - 45 mg/dL  CSF cell count with differential     Status: Abnormal   Collection Time: 09/06/19 11:17 AM  Result Value Ref Range   Tube # 3    Color, CSF COLORLESS COLORLESS   Appearance, CSF CLEAR (A) CLEAR   Supernatant NOT INDICATED    RBC Count, CSF 89 (H) 0 /cu mm   WBC, CSF 1 0 - 5 /cu mm   Other Cells, CSF TOO FEW TO COUNT, SMEAR AVAILABLE FOR REVIEW   Glucose, capillary     Status: Abnormal   Collection Time: 09/06/19 11:52 AM  Result Value Ref Range   Glucose-Capillary 129 (H) 70 - 99 mg/dL  Glucose, capillary     Status: Abnormal   Collection Time: 09/06/19  4:18 PM  Result Value Ref Range   Glucose-Capillary 175 (H) 70 - 99 mg/dL  Glucose, capillary     Status: Abnormal   Collection Time: 09/06/19  9:12 PM  Result Value Ref Range   Glucose-Capillary 165 (H) 70 - 99 mg/dL  Glucose, capillary     Status: Abnormal   Collection Time: 09/07/19  6:18 AM  Result Value Ref Range   Glucose-Capillary 112 (H) 70 - 99 mg/dL   EEG  Result Date: 09/05/2019 Alexis Goodell, MD     09/05/2019 11:29 AM ELECTROENCEPHALOGRAM REPORT Patient: Renee [name]       Room #: D501236 EEG No. ID: [id number] Age: 74 y.o.        Sex: female Referring Physician: Alfredia Ferguson Report Date:  09/05/2019       Interpreting Physician: Alexis Goodell History: Hilari Hable is an 74 y.o. female with right sided weakness Medications: Klonopin, Decadron, Cymbalta, Insulin, Keppra, Synthroid, Linzess, Latuda, Methadone, Crestor Conditions of Recording:  This is a 21 channel routine scalp EEG performed with bipolar and monopolar montages arranged in accordance to the international 10/20 system of electrode placement. One channel was dedicated to EKG recording. The patient is in the awake state. Description:  The waking background activity consists of a low voltage, symmetrical, fairly well organized, 10 Hz alpha activity, seen from the parieto-occipital and posterior temporal  regions.  Low voltage fast activity, poorly organized, is seen anteriorly and is at times superimposed on more posterior regions.  A mixture of theta and alpha rhythms are seen from  the central and temporal regions.  Despite the frequently noted artifact over the left hemisphere, this activity is slower over the left parietal region where there are noted underlying slower rhythms that slow the background in this region The patient does not drowse or sleep. Hyperventilation was not performed.   Intermittent photic stimulation was performed and elicits a symmetrical driving response but fails to elicit any abnormalities. IMPRESSION: This is an abnormal electroencephalogram secondary to slowing noted in the left parietal region consistent with the patient's radiological findings.  Alexis Goodell, MD Neurology [phone] 09/05/2019, 11:24 AM   DG FLUORO GUIDE LUMBAR PUNCTURE  Result Date: 09/06/2019 CLINICAL DATA:  Right leg weakness.  Cerebritis.  Brain mass EXAM: DIAGNOSTIC LUMBAR PUNCTURE UNDER FLUOROSCOPIC GUIDANCE FLUOROSCOPY TIME:  Radiation Exposure Index (as provided by the fluoroscopic device): 17.9 If the device does not provide the exposure index: Fluoroscopy Time (in minutes and seconds):  48 seconds Number of Acquired Images:  0 PROCEDURE: Informed consent was obtained from the patient prior to the procedure, including potential complications of headache, allergy, and pain. With the patient prone, the lower back was prepped with Betadine. 1% Lidocaine was used for local anesthesia. Lumbar puncture was performed at the L2-3 level using a 22 gauge needle with return of clear CSF with an opening pressure of 10 cm water. 13.5 ml of CSF were obtained for laboratory studies. The patient tolerated the procedure well and there were no apparent complications. IMPRESSION: Technically successful lumbar puncture with removal of 13.5 cc of clear CSF. Opening pressure was 10 cm of water. Electronically Signed   By:  Kerby Moors M.D.   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 Does the need for close, 24 hr/day medical supervision in concert with the patient's rehab needs make it unreasonable for this patient to be served in a less intensive setting? Yes 2. 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 Due to bladder management, bowel management, safety, skin/wound care, disease management, medication administration, pain management and patient education, does the patient require 24 hr/day rehab nursing? Yes 4. Does the patient require coordinated care of a physician, rehab nurse, therapy disciplines of PT, OT to address physical and functional deficits in the context of the above medical diagnosis(es)? Yes Addressing deficits in the following areas: balance, endurance, locomotion, strength, transferring, bowel/bladder control, bathing, dressing, feeding, grooming, toileting and psychosocial support 5. Can the patient actively participate in an intensive therapy program of at least 3 hrs of therapy per day at least 5 days per week? Yes 6. The potential for patient to make measurable gains while on inpatient rehab is good 7. Anticipated functional outcomes upon discharge from inpatient rehab are supervision  with PT, supervision with OT, n/a with SLP. 8. Estimated rehab length of stay to reach the above functional goals is: 10-14d 9. Anticipated discharge destination: Home 10. Overall Rehab/Functional Prognosis: good  RECOMMENDATIONS: This patient's condition is appropriate for continued rehabilitative care in the following setting: CIR Patient has agreed to participate in recommended program. Yes Note that insurance prior authorization may be required for reimbursement for recommended care.  Comment: Discussed with pt and daughter that further w/u of  brain mass will be completed on OP basis.  Pt will need referral to Dr Mickeal Skinner Neuro/Onc  Bary Leriche, PA-C 09/07/2019  "I have personally performed a face to face diagnostic evaluation of this  patient.  Additionally, I have reviewed and concur with the physician assistant's documentation above." Charlett Blake M.D. [facility] FAAPM&R (, Neuromuscular Med) Diplomate Am Board of Electrodiagnostic Med

## 2019-09-07 NOTE — Progress Notes (Signed)
No acute overnight events.  Patient continues to have right-sided weakness.  Patient will be seen by Dr. Mickeal Skinner, neuro oncologist today.  Will defer to him whether patient needs inpatient biopsy versus outpatient biopsy. We will continue to follow remaining CSF labs

## 2019-09-07 NOTE — Progress Notes (Signed)
Inpatient Rehabilitation Admissions Coordinator  I met with patient and her daughter at bedside. We discussed goals and expectations of an inpt rehab admit. They wish to proceed with authorization with her insurance. I will begin authorization and will follow up tomorrow.  Danne Baxter, RN, MSN Rehab Admissions Coordinator [phone] 09/07/2019 12:28 PM

## 2019-09-08 LAB — GLUCOSE, CAPILLARY
Glucose-Capillary: 107 mg/dL — ABNORMAL HIGH (ref 70–99)
Glucose-Capillary: 93 mg/dL (ref 70–99)
Glucose-Capillary: 110 mg/dL — ABNORMAL HIGH (ref 70–99)
Glucose-Capillary: 107 mg/dL — ABNORMAL HIGH (ref 70–99)

## 2019-09-08 LAB — BASIC METABOLIC PANEL
Anion gap: 10 (ref 5–15)
BUN: 22 mg/dL (ref 8–23)
CO2: 24 mmol/L (ref 22–32)
Calcium: 8.9 mg/dL (ref 8.9–10.3)
Chloride: 105 mmol/L (ref 98–111)
Creatinine, Ser: 1.03 mg/dL — ABNORMAL HIGH (ref 0.44–1.00)
GFR calc Af Amer: >60 mL/min (ref >60)
GFR calc non Af Amer: 55 mL/min — ABNORMAL LOW (ref >60)
Glucose, Bld: 133 mg/dL — ABNORMAL HIGH (ref 70–99)
Potassium: 4.6 mmol/L (ref 3.5–5.1)
Sodium: 139 mmol/L (ref 135–145)

## 2019-09-08 LAB — MAGNESIUM: Magnesium: 2.3 mg/dL (ref 1.7–2.4)

## 2019-09-08 LAB — PATHOLOGIST SMEAR REVIEW

## 2019-09-08 MED ORDER — DEXAMETHASONE 4 MG PO TABS
4.0000 mg | ORAL_TABLET | Freq: Two times a day (BID) | ORAL | Status: DC
  Administered 2019-09-08: 4 mg via ORAL
  Filled 2019-09-08: qty 1

## 2019-09-08 MED ORDER — DEXAMETHASONE 4 MG PO TABS: 4.0000 mg | ORAL_TABLET | Freq: Every day | ORAL | Status: DC

## 2019-09-08 MED ORDER — ENOXAPARIN SODIUM 40 MG/0.4ML ~~LOC~~ SOLN
40.0000 mg | SUBCUTANEOUS | Status: DC
  Administered 2019-09-08: 40 mg via SUBCUTANEOUS
  Filled 2019-09-08: qty 0.4

## 2019-09-08 MED ORDER — PANTOPRAZOLE SODIUM 40 MG PO TBEC: 40.0000 mg | DELAYED_RELEASE_TABLET | Freq: Two times a day (BID) | ORAL | Status: AC

## 2019-09-08 NOTE — Progress Notes (Signed)
Orthopedic Tech Progress Note Patient Details:  Renee [name] Jul 17, 1946 [id number]  Ortho Devices Type of Ortho Device: Prafo boot/shoe Ortho Device/Splint Location: rle Ortho Device/Splint Interventions: Ordered, Application, Adjustment   Post Interventions Patient Tolerated: Well Instructions Provided: Care of device, Adjustment of device   Karolee Stamps 09/08/2019, 1:39 PM

## 2019-09-08 NOTE — Progress Notes (Signed)
Charlett Blake, MD  Physician  Physical Medicine and Rehabilitation  Consult Note  Signed  Date of Service:  09/07/2019  8:45 AM      Related encounter: ED to Hosp-Admission (Current) from 09/03/2019 in [place] [facility]      Signed      Expand AllCollapse All   Show:Clear all [x] Manual[x] Template[] Copied  Added by: [x] Kirsteins, Luanna Salk, MD[x] Love, Ivan Anchors, PA-C  [] Hover for details          Physical Medicine and Rehabilitation Consult   Reason for Consult: Functional decline --work up underway.  Referring Physician: Dr. Alfredia Ferguson     HPI: Renee [name] is a 74 y.o. female with history of CKD, HTN, CLL,left breast cancer s/p lumpectomy with XRT '99, neuralgia, chronic rectal pain, anxiety disorder, PVD who was admitted on 09/04/19 with 2-3 week history of multiple falls with progressive weakness affecting [place] or right hand and right foot drop. MRI brain done showing 2 cm cystic lesion with vasogenic edema and question of tumor v/s cerebritis. She was transferred to [facility] for further workup. She was evaluated by neurology, started on decadron and MRI brain with contrast 2 cm subcortical white matter lesion in parietal lobe without enhancement --cerebritis or subacute infarct felt to be unlikely. UDS negative. LP done for work up showing elevated glucose and 1 WBC with 89 RBC. Dr. Rory Percy recommends biopsy with PET scan and follow up with Dr. Marijo Sanes be done on outpatient basis.  Two weeks ago patient hired aide and required walker due to falls/inability to care for herself. Was independent without AD prior to that.  Therapy evaluations completed revealing decreased balance, right lateral lean and right sided weakness affecting functional status. CIR recommended for follow up therapy.      Review of Systems  Constitutional: Negative for chills and fever.  HENT: Negative for hearing loss and tinnitus.   Eyes: Negative for blurred vision and double vision.    Respiratory: Positive for shortness of breath (DOE). Negative for wheezing.   Cardiovascular: Negative for chest pain, palpitations and leg swelling.  Gastrointestinal: Positive for constipation. Negative for heartburn and nausea.  Musculoskeletal: Positive for falls.  Skin: Negative for itching and rash.  Neurological: Positive for sensory change (in feet), focal weakness and weakness (right foot weakness).  Psychiatric/Behavioral: Positive for depression. The patient is nervous/anxious. The patient does not have insomnia.           Past Medical History:  Diagnosis Date  . Chronic renal disease, stage 3, moderately decreased glomerular filtration rate between 30-59 mL/min/1.73 square meter 12/27/2017  . Constipation    . Depression    . History of breast cancer 12/27/2017  . Hypertension    . Leukemia ([place])      CLL  . PVD (peripheral vascular disease) ([place])    . Thyroid disease             Past Surgical History:  Procedure Laterality Date  . ABDOMINAL HYSTERECTOMY      . BREAST SURGERY        Left Breast Lumpectomy  . COLON SURGERY      . HERNIA REPAIR   2000    Hiatal hernia repair           Family History  Problem Relation Age of Onset  . Healthy Mother    . Healthy Father    . Healthy Sister          "normal stuff for her age"  .  Pancreatic cancer Brother        Social History:  Reports that son stays with her and does some home management.  She was independent and driving prior to 3 weeks ago.  She reports that she quit smoking about in 1981.  She started smoking about 57 years ago. She smoked 0.75 packs per day. She has never used smokeless tobacco. She reports that she does not drink alcohol or use drugs.           Allergies  Allergen Reactions  . Bupropion Other (See Comments)      Fidgety   . Clonidine Derivatives Other (See Comments)  . Codeine Nausea Only  . Diclofenac-Misoprostol Other (See Comments)      Fidgety   . Olanzapine Other (See Comments)       Fidgety   . Pregabalin Other (See Comments) and Rash  . Valdecoxib Other (See Comments)  . Zonisamide Other (See Comments)      Fidgety             Medications Prior to Admission  Medication Sig Dispense Refill  . clonazePAM (KLONOPIN) 0.5 MG tablet Take 0.5 mg by mouth 2 (two) times daily.       . DULoxetine (CYMBALTA) 60 MG capsule Take 60 mg by mouth daily.      [name] Kitchen ENULOSE 10 GM/15ML SOLN TAKE 15 MLS (10 G TOTAL) BY MOUTH TWO (2) (TWO) TIMES DAILY AS NEEDED FOR MILD CONSTIPATION OR SEVERE CONSTIPATION.  236 mL 0  . KRILL OIL PO Take 900 mg by mouth daily.      [name] Kitchen levothyroxine (SYNTHROID, LEVOTHROID) 100 MCG tablet Take 100 mcg by mouth daily before breakfast.      . linaclotide (LINZESS) 290 MCG CAPS capsule Take 1 capsule (290 mcg total) by mouth daily before breakfast. 30 capsule 1  . LINZESS 72 MCG capsule        . lurasidone (LATUDA) 40 MG TABS tablet Take 40 mg by mouth daily with breakfast.      . methadone (DOLOPHINE) 10 MG tablet 1.5 tabs ( 15 mg ) bid and 1 tab at HS, 4 per day      . pantoprazole (PROTONIX) 40 MG tablet Take 40 mg by mouth daily.      . polyethylene glycol (MIRALAX / GLYCOLAX) packet Take 17 g by mouth daily.      . rosuvastatin (CRESTOR) 20 MG tablet Take 20 mg by mouth daily.      [name] Kitchen senna-docusate (SENOKOT-S) 8.6-50 MG tablet Take 1 tablet by mouth 2 (two) times daily. 60 tablet 3  . Calcium Carbonate-Vitamin D (CALCIUM HIGH POTENCY/VITAMIN D) 600-200 MG-UNIT TABS Take by mouth daily.       [name] Kitchen lactulose (CHRONULAC) 10 GM/15ML solution Take 15 mLs (10 g total) by mouth 4 (four) times daily as needed for mild constipation or severe constipation. 236 mL 5  . lubiprostone (AMITIZA) 8 MCG capsule Take 1 capsule (8 mcg total) by mouth 2 (two) times daily with a meal. 60 capsule 0  . morphine (MSIR) 15 MG tablet Take 15 mg by mouth 4 (four) times daily as needed.      . pantoprazole (PROTONIX) 20 MG tablet Take 1 tablet (20 mg total) by mouth 2 (two) times  daily. 60 tablet 1      Home: Home Living Family/patient expects to be discharged to:: Private residence Living Arrangements: Children(son) Available Help at Discharge: Family, Available PRN/intermittently Type of Home: House Home Access: Stairs to enter  Entrance Stairs-Number of Steps: 2 Home Layout: Able to live on main level with bedroom/bathroom Bathroom Shower/Tub: Chiropodist: Handicapped height Home Equipment: Bedside commode, Environmental consultant - 2 wheels, Wheelchair - manual  Functional History: Prior Function Level of Independence: Needs assistance Gait / Transfers Assistance Needed: using walker, frequent falls ADL's / Homemaking Assistance Needed: Been doing sponge baths. Aide coming on Tu/Th to help with bathing, dressing, cooking/cleaning, laundry. Pt has required increased assist with ADL's over past week in setting of frequent falling. Functional Status:  Mobility: Bed Mobility Overal bed mobility: Needs Assistance Bed Mobility: Supine to Sit Supine to sit: Min assist General bed mobility comments: Pt able to bring BLEs towards EOB and then using therpaist hand to pull into sitting position with Min A Transfers Overall transfer level: Needs assistance Equipment used: Rolling walker (2 wheeled) Transfers: Sit to/from Stand Sit to Stand: Mod assist, +2 physical assistance General transfer comment: Mod A +2 to power up into standing and then main balance. Pt with tendency for right lateral lean Ambulation/Gait Ambulation/Gait assistance: Mod assist, +2 physical assistance Gait Distance (Feet): 10 Feet Assistive device: Rolling walker (2 wheeled) Gait Pattern/deviations: Step-through pattern, Decreased stride length, Decreased stance time - right, Decreased dorsiflexion - right, Decreased weight shift to right General Gait Details: Pt requiring modA + 2 for stability, heavy reliance through arms. Cues for sequencing, upright posture. Pt with erratic right  step length/width, right lateral lean, decreased foot clearance, decreased heel strike at initial contact Gait velocity: decreasecd Gait velocity interpretation: <1.8 ft/sec, indicate of risk for recurrent falls   ADL: ADL Overall ADL's : Needs assistance/impaired Eating/Feeding: Set up, Sitting Grooming: Oral care, Maximal assistance, Standing, Brushing hair, Sitting, Min guard Grooming Details (indicate cue type and reason): Pt requiring Max A for standing balance while at sink performing oral care. Pt with significant lean to right and required cues for correction. Decreased coorindation and grasp strength at RUE as she managed tooth brush and tooth paste. Able to brush her hair while seated in recliner Upper Body Bathing: Minimal assistance, Sitting Lower Body Bathing: Moderate assistance, +2 for physical assistance, Sit to/from stand Upper Body Dressing : Minimal assistance, Sitting Lower Body Dressing: Moderate assistance, +2 for safety/equipment, Sit to/from stand Lower Body Dressing Details (indicate cue type and reason): Pt donning her left shoe with Mod A for postural control and sitting balance (lateral leaning to right). Requriing assistance to bring right ankle to knee and then Mod A for sitting balance when donning right shoe.  Toilet Transfer: Moderate assistance, +2 for physical assistance, Ambulation, BSC, RW Toilet Transfer Details (indicate cue type and reason): Mod A for power up and to maintain balance during mobility. Cues for hand placement at [place] and Hygiene: Moderate assistance, +2 for physical assistance, Sit to/from stand Toileting - Clothing Manipulation Details (indicate cue type and reason): Pt able to perform peri care in standing with Mod A +2 for standing balance Functional mobility during ADLs: Moderate assistance, +2 for physical assistance, Rolling walker General ADL Comments: Pt presenting with decreased balance, strength, and  coorindation at RUE/RLE.    Cognition: Cognition Overall Cognitive Status: Within Functional Limits for tasks assessed Orientation Level: Oriented X4 Cognition Arousal/Alertness: Awake/alert Behavior During Therapy: WFL for tasks assessed/performed, Anxious Overall Cognitive Status: Within Functional Limits for tasks assessed General Comments: Pt verbalizing fear of falling and anxious about performing mobility.    Blood pressure (!) 110/54, pulse 69, temperature 98.4 F (36.9 C), temperature source  Oral, resp. rate 16, height 6\' 1"  (1.854 m), weight 83.5 kg, SpO2 96 %. Physical Exam  Constitutional: She is oriented to person, place, and time. She appears well-developed and well-nourished. No distress.  HENT:  Head: Normocephalic and atraumatic.  Facial flushing  Eyes: Pupils are equal, round, and reactive to light. Conjunctivae and EOM are normal.  Neck: No JVD present.  Cardiovascular: Normal rate, regular rhythm and normal heart sounds.  No murmur heard. Respiratory: Effort normal and breath sounds normal. No stridor. No respiratory distress. She has no wheezes.  GI: Soft. Bowel sounds are normal. She exhibits no distension. There is no abdominal tenderness.  Musculoskeletal:        General: No deformity or edema.  Neurological: She is alert and oriented to person, place, and time. Coordination abnormal.  Motor strength  3- Right deltoid, 3/5 Bi tri finger flex and ext  3- Righ thip flexion, 4/5 R Knee ext, 2- Right ankle DF/  Left side  4/5  In LUE and LLE  Sensation reduced to LT in bilateral feet, intact in hands    Skin: Skin is warm and dry. She is not diaphoretic.  Psychiatric: She has a normal mood and affect. Her behavior is normal. Judgment and thought content normal.      Lab Results Last 24 Hours        Results for orders placed or performed during the hospital encounter of 09/03/19 (from the past 24 hour(s))  CSF culture     Status: None (Preliminary result)     Collection Time: 09/06/19 11:00 AM    Specimen: PATH Cytology CSF; Cerebrospinal Fluid  Result Value Ref Range    Specimen Description CSF      Special Requests NONE      Gram Stain          NO WBC SEEN NO ORGANISMS SEEN CYTOSPIN SMEAR Performed at [facility], [address], [place], [place] [address]      Culture PENDING      Report Status PENDING    Glucose, CSF     Status: Abnormal    Collection Time: 09/06/19 11:17 AM  Result Value Ref Range    Glucose, CSF 79 (H) 40 - 70 mg/dL  Protein, CSF     Status: None    Collection Time: 09/06/19 11:17 AM  Result Value Ref Range    Total  Protein, CSF 27 15 - 45 mg/dL  CSF cell count with differential     Status: Abnormal    Collection Time: 09/06/19 11:17 AM  Result Value Ref Range    Tube # 3      Color, CSF COLORLESS COLORLESS    Appearance, CSF CLEAR (A) CLEAR    Supernatant NOT INDICATED      RBC Count, CSF 89 (H) 0 /cu mm    WBC, CSF 1 0 - 5 /cu mm    Other Cells, CSF TOO FEW TO COUNT, SMEAR AVAILABLE FOR REVIEW    Glucose, capillary     Status: Abnormal    Collection Time: 09/06/19 11:52 AM  Result Value Ref Range    Glucose-Capillary 129 (H) 70 - 99 mg/dL  Glucose, capillary     Status: Abnormal    Collection Time: 09/06/19  4:18 PM  Result Value Ref Range    Glucose-Capillary 175 (H) 70 - 99 mg/dL  Glucose, capillary     Status: Abnormal    Collection Time: 09/06/19  9:12 PM  Result Value  Ref Range    Glucose-Capillary 165 (H) 70 - 99 mg/dL  Glucose, capillary     Status: Abnormal    Collection Time: 09/07/19  6:18 AM  Result Value Ref Range    Glucose-Capillary 112 (H) 70 - 99 mg/dL       Imaging Results (Last 48 hours)  EEG   Result Date: 09/05/2019 Alexis Goodell, MD     09/05/2019 11:29 AM ELECTROENCEPHALOGRAM REPORT Patient: Kateryn Nighswonger       Room #: [id number] EEG No. ID: [id number] Age: 74 y.o.        Sex: female Referring Physician: Alfredia Ferguson Report Date:  09/05/2019       Interpreting Physician:  Alexis Goodell History: Renee [name] is an 74 y.o. female with right sided weakness Medications: Klonopin, Decadron, Cymbalta, Insulin, Keppra, Synthroid, Linzess, Latuda, Methadone, Crestor Conditions of Recording:  This is a 21 channel routine scalp EEG performed with bipolar and monopolar montages arranged in accordance to the international 10/20 system of electrode placement. One channel was dedicated to EKG recording. The patient is in the awake state. Description:  The waking background activity consists of a low voltage, symmetrical, fairly well organized, 10 Hz alpha activity, seen from the parieto-occipital and posterior temporal regions.  Low voltage fast activity, poorly organized, is seen anteriorly and is at times superimposed on more posterior regions.  A mixture of theta and alpha rhythms are seen from the central and temporal regions.  Despite the frequently noted artifact over the left hemisphere, this activity is slower over the left parietal region where there are noted underlying slower rhythms that slow the background in this region The patient does not drowse or sleep. Hyperventilation was not performed.   Intermittent photic stimulation was performed and elicits a symmetrical driving response but fails to elicit any abnormalities. IMPRESSION: This is an abnormal electroencephalogram secondary to slowing noted in the left parietal region consistent with the patient's radiological findings.  Alexis Goodell, MD Neurology [phone] 09/05/2019, 11:24 AM    DG FLUORO GUIDE LUMBAR PUNCTURE   Result Date: 09/06/2019 CLINICAL DATA:  Right leg weakness.  Cerebritis.  Brain mass EXAM: DIAGNOSTIC LUMBAR PUNCTURE UNDER FLUOROSCOPIC GUIDANCE FLUOROSCOPY TIME:  Radiation Exposure Index (as provided by the fluoroscopic device): 17.9 If the device does not provide the exposure index: Fluoroscopy Time (in minutes and seconds):  48 seconds Number of Acquired Images:  0 PROCEDURE: Informed consent was  obtained from the patient prior to the procedure, including potential complications of headache, allergy, and pain. With the patient prone, the lower back was prepped with Betadine. 1% Lidocaine was used for local anesthesia. Lumbar puncture was performed at the L2-3 level using a 22 gauge needle with return of clear CSF with an opening pressure of 10 cm water. 13.5 ml of CSF were obtained for laboratory studies. The patient tolerated the procedure well and there were no apparent complications. IMPRESSION: Technically successful lumbar puncture with removal of 13.5 cc of clear CSF. Opening pressure was 10 cm of water. Electronically Signed   By: Kerby Moors M.D.   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 Does the need for close, 24 hr/day medical supervision in concert with the patient's rehab needs make it unreasonable for this patient to be served in a less intensive setting? Yes 2. 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 Due to  bladder management, bowel management, safety, skin/wound care, disease management, medication administration, pain management and patient education, does the patient require 24 hr/day rehab nursing? Yes 4. Does the patient require coordinated care of a physician, rehab nurse, therapy disciplines of PT, OT to address physical and functional deficits in the context of the above medical diagnosis(es)? Yes Addressing deficits in the following areas: balance, endurance, locomotion, strength, transferring, bowel/bladder control, bathing, dressing, feeding, grooming, toileting and psychosocial support 5. Can the patient actively participate in an intensive therapy program of at least 3 hrs of therapy per day at least 5 days per week? Yes 6. The potential for patient to make measurable gains while on inpatient rehab is  good 7. Anticipated functional outcomes upon discharge from inpatient rehab are supervision  with PT, supervision with OT, n/a with SLP. 8. Estimated rehab length of stay to reach the above functional goals is: 10-14d 9. Anticipated discharge destination: Home 10. Overall Rehab/Functional Prognosis: good   RECOMMENDATIONS: This patient's condition is appropriate for continued rehabilitative care in the following setting: CIR Patient has agreed to participate in recommended program. Yes Note that insurance prior authorization may be required for reimbursement for recommended care.   Comment: Discussed with pt and daughter that further w/u of brain mass will be completed on OP basis.  Pt will need referral to Dr Mickeal Skinner Neuro/Onc   Bary Leriche, PA-C 09/07/2019  "I have personally performed a face to face diagnostic evaluation of this patient.  Additionally, I have reviewed and concur with the physician assistant's documentation above." Charlett Blake M.D. [facility] FAAPM&R (, Neuromuscular Med) Diplomate Am Board of Electrodiagnostic Med          Revision History                     Routing History

## 2019-09-08 NOTE — Discharge Summary (Signed)
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  Diet recommendation: Low sodium, heart healthy.   Wound care: None.  Code status: DNR   History of Present Illness:  As per HPI,  Renee [name] a 74 y.o.femalewith medical history significant ofstage I CLL,  neuralgia followed at the [facility] pain clinic, hypertension, CKD 3, thyroid disease presented to the hospital with several weeks of increasing paresthesia in her right foot. Patient does have history of peripheral neuropathy to which she attributed her symptoms. Over the intervening period of time,she had progressive paresthesia in the right leg with increasing weakness. She reported that she has had multiple falls due to poor balance.Because of her progressive weakness and falling she presents to the [facility] emergency department for evaluation  ED Course:Patient was hemodynamically stable in the emergency department. Her evaluation did reveal some right-sided weakness and ataxia. CT scan of the head revealed a left parietal abnormality.MRI/MRA reveals 2 cm cystic lesion deep left parietal region with edema, no vascular occlusion. Patient was then admitted to the hospital  for work up of cystic brain lesion.   Hospital Course:  Following conditions were addressed during hospitalization as listed below,  Recurrent falls with right-sided weakness  No acute CVA. MRI showed 2 cm cystic lesion involving the subcortical white matter of the left parietal lobe which demonstrated no intrinsic enhancement with suspicion for low-grade glioma.   Neuro oncology Dr Mickeal Skinner was consulted and recommended contrast-enhanced MRI in 1 month.  Deferred brain biopsy at this time.  Oncology Office will arrange a follow-up visit with her including imaging. Patient also had lumbar puncture which was negative for infection.   Was on Keppra initially but Spoke with neurology Dr Aroor who did not feel that Keppra was necessary at this time.  This has been discontinued. Patient will however should be notified about possibility of seizures and symptomatology on discharge.  Oncology recommends decreasing Decadron to 4 mg at discharge.  Will change Decadron to once daily.   Hyperglycemia while on steroids.  consider Continue sliding scale insulin if necessary.  Closely monitor blood glucose levels.  Last POC glucose was 93  Stage I CLL -Latest WBC at 80.9.  Check CBC in a.m. Patient follows up with Dr. Delton [facility]ombes at [facility]. Baseline WBC is around 80,000 per patient.  Watch closely on steroids.  PeripheralNeuropathy/PudendalNeuralgia/ChronicPain -Continue home pain regimen with methadone ,cymbalta  History ofBreastCancer -Apparently in remission since 1999.  Hypothyroidism continue levothyroxine 100 mcg daily.  TSH of 2.0.  DepressiveDisorder withAnxiety -Stable. ContinueLurasidone, Duloxetineand Clonazepam   CKD III -Stable at this time, creatinine of 0.9  Hyperlipidemia -continue rosuvastatin   IBS/Constipation -ContinueLactulose Linaclotide, MiraLAX, Senna-Docusate   NormocyticAnemia -Latest hemoglobin of 10.0 today.  Will closely  monitor  GERD continue pantoprazole  Disposition.   Patient was seen by by physical therapy who recommend CIR. At this time, patient is stable for disposition to CIR.  Medical Consultants:    Oncology  Neurology  PMR  Procedures:     Lumbar puncture Subjective:   Today, patient denies any headache, dizziness, lightheadedness, nausea vomiting.  No shortness of breath.  Right lower extremity weakness persist  Discharge Exam:   Vitals:   09/08/19 0858 09/08/19 1221  BP: 99/74 117/67  Pulse: 75 75  Resp: 18 16  Temp: 98.6 F (37 C) 98.7 F (37.1 C)  SpO2: 98% 96%   Vitals:   09/07/19 2340 09/08/19 0412 09/08/19 0858 09/08/19 1221  BP: (!) 102/55 (!) 100/58 99/74 117/67  Pulse: 66 (!) 59 75 75  Resp: 18 16 18 16   Temp: 97.7 F (36.5 C) 97.9 F (36.6 C) 98.6 F (37 C) 98.7 F (37.1 C)  TempSrc: Oral  Oral Oral  SpO2: 98% 96% 98% 96%  Weight:      Height:        GENERAL: Patient is alert awake and oriented. Not in obvious distress. HENT: No scleral pallor or icterus. Pupils equally reactive to light. Oral mucosa is moist NECK: is supple, no palpable thyroid enlargement. CHEST: Clear to auscultation. No crackles or wheezes. Non tender on palpation. Diminished breath sounds bilaterally. CVS: S1 and S2 heard, no murmur. Regular rate and rhythm. No pericardial rub. ABDOMEN: Soft, non-tender, bowel sounds are present. EXTREMITIES: No edema. CNS: Cranial nerves are intact.  Right lower extremity weakness.  Mild right upper extremity handgrip weakness. SKIN: warm and dry without rashes.   The results of significant diagnostics from this hospitalization (including imaging, microbiology, ancillary and laboratory) are listed below for reference.     Diagnostic Studies:   DG Chest 2 View  Result Date: 09/03/2019 CLINICAL DATA:  Fall EXAM: CHEST - 2 VIEW COMPARISON:  CT 05/06/2019 FINDINGS: No acute airspace disease, pleural effusion or pneumothorax. Normal  heart size. Aortic atherosclerosis. Nodular opacities at the bilateral hila, felt to correspond to adenopathy noted on previous CT. Moderate hiatal hernia. No pneumothorax. Degenerative changes of the spine. IMPRESSION: 1. No active cardiopulmonary disease. 2. Nodular opacities at the hila, felt to correspond to adenopathy noted on previous CT. 3. Moderate hiatal hernia. Electronically Signed   By: Donavan Foil M.D.   On: 09/03/2019 20:06   CT Head Wo Contrast  Result Date: 09/03/2019 CLINICAL DATA:  Recent fall with right leg weakness, initial encounter EXAM: CT HEAD WITHOUT CONTRAST TECHNIQUE: Contiguous axial images were obtained from the base of the skull through the vertex without intravenous contrast. COMPARISON:  None. FINDINGS: Brain: Area of decreased attenuation is noted in the deep white matter of the left parietal lobe near the vertex consistent with subacute ischemia. No findings to suggest acute infarct are seen. No findings to suggest acute hemorrhage or space-occupying mass lesion are noted. Vascular: No hyperdense vessel or unexpected calcification. Skull: Normal. Negative for fracture or focal lesion. Sinuses/Orbits: No acute finding. Other: None. IMPRESSION: Area consistent with prior ischemia within the deep white matter of the left parietal lobe as described. No acute infarct is noted. Electronically Signed   By: Inez Catalina M.D.   On: 09/03/2019 14:50   MR ANGIO HEAD WO CONTRAST  Result Date: 09/03/2019 CLINICAL DATA:  [place] today with right leg weakness. EXAM: MRI HEAD WITHOUT CONTRAST MRA HEAD WITHOUT CONTRAST TECHNIQUE: Multiplanar, multiecho pulse sequences of the brain and surrounding structures were obtained without  intravenous contrast. Angiographic images of the head were obtained using MRA technique without contrast. COMPARISON:  Head CT same day FINDINGS: MRI HEAD FINDINGS Brain: The brainstem and cerebellum are normal. Cerebral hemispheres show age related volume loss with  some frontal predominance. Right cerebral hemisphere shows minimal small vessel change of the white matter. Left cerebral hemisphere shows mild small vessel change of the white matter. At the left parietal vertex, within the deep white matter, there is a 2 cm in diameter cystic lesion with surrounding low level restricted diffusion and some adjacent vasogenic edema. This does not look like a typical ischemic infarction. No evidence of susceptibility artifact to suggest resolving hematoma. The differential diagnosis includes tumor and cerebritis, both infectious inflammatory and autoimmune. This includes progressive multifocal leukoencephalopathy. I would suggest postcontrast imaging to evaluate this more completely. Vascular: Major vessels at the base of the brain show flow. Skull and upper cervical spine: Negative Sinuses/Orbits: Clear/normal Other: None MRA HEAD FINDINGS Both internal carotid arteries are widely patent into the brain. No siphon stenosis. The anterior and middle cerebral vessels are patent without proximal stenosis, aneurysm or vascular malformation. Both vertebral arteries are widely patent to the basilar. No basilar stenosis. Posterior circulation branch vessels appear normal. IMPRESSION: Normal MR angiography. Findings not suggestive of ischemic infarction. 2 cm cystic lesion in the left parietal deep white matter with regional vasogenic edema and a possible wall showing some restricted diffusion. Differential diagnosis is tumor versus cerebritis. Postcontrast imaging would be suggested for more complete evaluation. Electronically Signed   By: Nelson Chimes M.D.   On: 09/03/2019 20:10   MR BRAIN WO CONTRAST  Result Date: 09/03/2019 CLINICAL DATA:  [place] today with right leg weakness. EXAM: MRI HEAD WITHOUT CONTRAST MRA HEAD WITHOUT CONTRAST TECHNIQUE: Multiplanar, multiecho pulse sequences of the brain and surrounding structures were obtained without intravenous contrast. Angiographic images  of the head were obtained using MRA technique without contrast. COMPARISON:  Head CT same day FINDINGS: MRI HEAD FINDINGS Brain: The brainstem and cerebellum are normal. Cerebral hemispheres show age related volume loss with some frontal predominance. Right cerebral hemisphere shows minimal small vessel change of the white matter. Left cerebral hemisphere shows mild small vessel change of the white matter. At the left parietal vertex, within the deep white matter, there is a 2 cm in diameter cystic lesion with surrounding low level restricted diffusion and some adjacent vasogenic edema. This does not look like a typical ischemic infarction. No evidence of susceptibility artifact to suggest resolving hematoma. The differential diagnosis includes tumor and cerebritis, both infectious inflammatory and autoimmune. This includes progressive multifocal leukoencephalopathy. I would suggest postcontrast imaging to evaluate this more completely. Vascular: Major vessels at the base of the brain show flow. Skull and upper cervical spine: Negative Sinuses/Orbits: Clear/normal Other: None MRA HEAD FINDINGS Both internal carotid arteries are widely patent into the brain. No siphon stenosis. The anterior and middle cerebral vessels are patent without proximal stenosis, aneurysm or vascular malformation. Both vertebral arteries are widely patent to the basilar. No basilar stenosis. Posterior circulation branch vessels appear normal. IMPRESSION: Normal MR angiography. Findings not suggestive of ischemic infarction. 2 cm cystic lesion in the left parietal deep white matter with regional vasogenic edema and a possible wall showing some restricted diffusion. Differential diagnosis is tumor versus cerebritis. Postcontrast imaging would be suggested for more complete evaluation. Electronically Signed   By: Nelson Chimes M.D.   On: 09/03/2019 20:10   ECHOCARDIOGRAM COMPLETE  Result Date: 09/04/2019  ECHOCARDIOGRAM REPORT   Patient  Name:   Renee [name] Date of Exam: 09/04/2019 Medical Rec #:  [id number]    Height:       73.0 in Accession #:    [id number]   Weight:       184.0 lb Date of Birth:  [birth year]    BSA:          2.08 m Patient Age:    13 years     BP:           108/53 mmHg Patient Gender: F            HR:           67 bpm. Exam Location:  [facility] Procedure: 2D Echo Indications:    Stroke 434.91 / I163.9  History:        Patient has no prior history of Echocardiogram examinations.                 Risk Factors:Former Smoker and Hypertension. History of breast                 cancer, Chronic Renal Disease, GERD.  Sonographer:    Leavy Cella RDCS (AE) Referring Phys: [place]  1. Left ventricular ejection fraction, by visual estimation, is 70 to 75%. The left ventricle has hyperdynamic function. There is no left ventricular hypertrophy.  2. Left ventricular diastolic parameters are indeterminate.  3. The left ventricle has no regional wall motion abnormalities.  4. Global right ventricle has normal systolic function.The right ventricular size is normal. No increase in right ventricular wall thickness.  5. Left atrial size was normal.  6. Right atrial size was normal.  7. Presence of pericardial fat pad.  8. The mitral valve is grossly normal. No evidence of mitral valve regurgitation.  9. The tricuspid valve is grossly normal. 10. The aortic valve is tricuspid. Aortic valve regurgitation is not visualized. 11. The pulmonic valve was grossly normal. Pulmonic valve regurgitation is trivial. 12. TR signal is inadequate for assessing pulmonary artery systolic pressure. 13. The inferior vena cava is normal in size with greater than 50% respiratory variability, suggesting right atrial pressure of 3 mmHg. FINDINGS  Left Ventricle: Left ventricular ejection fraction, by visual estimation, is 70 to 75%. The left ventricle has hyperdynamic function. The left ventricle has no regional wall motion abnormalities. There is  no left ventricular hypertrophy. Left ventricular diastolic parameters are indeterminate. Right Ventricle: The right ventricular size is normal. No increase in right ventricular wall thickness. Global RV systolic function is has normal systolic function. Left Atrium: Left atrial size was normal in size. Right Atrium: Right atrial size was normal in size Pericardium: There is no evidence of pericardial effusion. Presence of pericardial fat pad. Mitral Valve: The mitral valve is grossly normal. No evidence of mitral valve regurgitation. Tricuspid Valve: The tricuspid valve is grossly normal. Tricuspid valve regurgitation is trivial. Aortic Valve: The aortic valve is tricuspid. Aortic valve regurgitation is not visualized. Pulmonic Valve: The pulmonic valve was grossly normal. Pulmonic valve regurgitation is trivial. Pulmonic regurgitation is trivial. Aorta: The aortic root is normal in size and structure. Venous: The inferior vena cava is normal in size with greater than 50% respiratory variability, suggesting right atrial pressure of 3 mmHg. IAS/Shunts: No atrial level shunt detected by color flow Doppler.  LEFT VENTRICLE PLAX 2D LVIDd:         3.75 cm  Diastology LVIDs:  2.13 cm  LV e' lateral:   8.92 cm/s LV PW:         0.98 cm  LV E/e' lateral: 8.7 LV IVS:        0.96 cm  LV e' medial:    11.50 cm/s LVOT diam:     2.20 cm  LV E/e' medial:  6.8 LV SV:         45 ml LV SV Index:   21.71 LVOT Area:     3.80 cm  RIGHT VENTRICLE RV S prime:     12.40 cm/s TAPSE (M-mode): 2.2 cm LEFT ATRIUM             Index       RIGHT ATRIUM           Index LA diam:        2.60 cm 1.25 cm/m  RA Area:     11.20 cm LA Vol (A2C):   44.3 ml 21.33 ml/m RA Volume:   27.00 ml  13.00 ml/m LA Vol (A4C):   32.8 ml 15.79 ml/m LA Biplane Vol: 40.5 ml 19.50 ml/m   AORTA Ao Root diam: 2.80 cm MITRAL VALVE MV Area (PHT): 3.37 cm             SHUNTS MV PHT:        65.25 msec           Systemic Diam: 2.20 cm MV Decel Time: 225 msec MV E  velocity: 77.70 cm/s 103 cm/s MV A velocity: 83.20 cm/s 70.3 cm/s MV E/A ratio:  0.93       1.5  Rozann Lesches MD Electronically signed by Rozann Lesches MD Signature Date/Time: 09/04/2019/1:53:34 PM    Final      Labs:   Basic Metabolic Panel: Recent Labs  Lab 09/03/19 1716 09/05/19 0757 09/06/19 0551 09/08/19 0313  NA 140 140 140 139  K 4.0 4.5 4.0 4.6  CL 103 108 107 105  CO2 24 24 25 24   GLUCOSE 103* 131* 124* 133*  BUN 11 10 15 22   CREATININE 1.05* 1.04* 0.96 1.03*  CALCIUM 9.2 8.7* 9.0 8.9  MG  --  2.2 2.3 2.3  PHOS  --  4.2 5.1*  --    GFR Estimated Creatinine Clearance: 59.6 mL/min (A) (by C-G formula based on SCr of 1.03 mg/dL (H)). Liver Function Tests: Recent Labs  Lab 09/03/19 1716 09/05/19 0757 09/06/19 0551  AST 24 18 16   ALT 18 15 13   ALKPHOS 74 52 59  BILITOT 1.2 0.7 0.9  PROT 7.3 5.9* 5.9*  ALBUMIN 4.4 3.6 3.6   No results for input(s): LIPASE, AMYLASE in the last 168 hours. No results for input(s): AMMONIA in the last 168 hours. Coagulation profile Recent Labs  Lab 09/03/19 1716  INR 0.9    CBC: Recent Labs  Lab 09/03/19 1716 09/05/19 0757 09/06/19 0551  WBC 103.5* 74.1* 80.9*  NEUTROABS 5.3 6.6 7.4  HGB 11.2* 9.7* 10.0*  HCT 39.3 32.5* 33.7*  MCV 89.5 86.9 86.9  PLT 266 212 231   Cardiac Enzymes: No results for input(s): CKTOTAL, CKMB, CKMBINDEX, TROPONINI in the last 168 hours. BNP: Invalid input(s): POCBNP CBG: Recent Labs  Lab 09/07/19 1147 09/07/19 1618 09/07/19 2123 09/08/19 0604 09/08/19 1224  GLUCAP 104* 105* 157* 107* 93   D-Dimer No results for input(s): DDIMER in the last 72 hours. Hgb A1c No results for input(s): HGBA1C in the last 72 hours. Lipid Profile No results for input(s): CHOL,  HDL, LDLCALC, TRIG, CHOLHDL, LDLDIRECT in the last 72 hours. Thyroid function studies Recent Labs    09/06/19 0551  TSH 2.035   Anemia work up Recent Labs    09/06/19 0551  VITAMINB12 231  FOLATE 7.3  FERRITIN 5*   TIBC 476*  IRON 30  RETICCTPCT 1.5   Microbiology Recent Results (from the past 240 hour(s))  SARS CORONAVIRUS 2 (TAT 6-24 HRS) Nasopharyngeal Nasopharyngeal Swab     Status: None   Collection Time: 09/03/19  9:04 PM   Specimen: Nasopharyngeal Swab  Result Value Ref Range Status   SARS Coronavirus 2 NEGATIVE NEGATIVE Final    Comment: (NOTE) SARS-CoV-2 target nucleic acids are NOT DETECTED. The SARS-CoV-2 RNA is generally detectable in upper and lower respiratory specimens during the acute phase of infection. Negative results do not preclude SARS-CoV-2 infection, do not rule out co-infections with other pathogens, and should not be used as the sole basis for treatment or other patient management decisions. Negative results must be combined with clinical observations, patient history, and epidemiological information. The expected result is Negative. Fact Sheet for Patients: SugarRoll.be Fact Sheet for Healthcare Providers: https://www.woods-mathews.com/ This test is not yet approved or cleared by the [place] FDA and  has been authorized for detection and/or diagnosis of SARS-CoV-2 by FDA under an Emergency Use Authorization (EUA). This EUA will remain  in effect (meaning this test can be used) for the duration of the COVID-19 declaration under Section 56 4(b)(1) of the Act, 21 U.S.C. section 360bbb-3(b)(1), unless the authorization is terminated or revoked sooner. Performed at [facility], [place] [address] [place], [place] [address]   CSF culture     Status: None (Preliminary result)   Collection Time: 09/06/19 11:00 AM   Specimen: PATH Cytology CSF; Cerebrospinal Fluid  Result Value Ref Range Status   Specimen Description CSF  Final   Special Requests NONE  Final   Gram Stain NO WBC SEEN NO ORGANISMS SEEN CYTOSPIN SMEAR   Final   Culture   Final    NO GROWTH 2 DAYS Performed at [facility], [place] [address], [place], [place] [address]    Report Status PENDING  Incomplete     Discharge Instructions:   Discharge Instructions    Diet - low sodium heart healthy   Complete by: As directed    Discharge instructions   Complete by: As directed    Follow up with neuro-oncology as scheduled by the clinic.   Increase activity slowly   Complete by: As directed    As per Rehab     Allergies as of 09/08/2019      Reactions   Bupropion Other (See Comments)   Fidgety    Clonidine Derivatives Other (See Comments)   Codeine Nausea Only   Diclofenac-misoprostol Other (See Comments)   Fidgety    Olanzapine Other (See Comments)   Fidgety    Pregabalin Other (See Comments), Rash   Valdecoxib Other (See Comments)   Zonisamide Other (See Comments)   Fidgety       Medication List    STOP taking these medications   lubiprostone 8 MCG capsule Commonly known as: Amitiza   morphine 15 MG tablet Commonly known as: MSIR     TAKE these medications   Calcium High Potency/Vitamin D 600-200 MG-UNIT Tabs Generic drug: Calcium Carbonate-Vitamin D Take by mouth daily.   clonazePAM 0.5 MG tablet Commonly known as: KLONOPIN Take 0.5 mg by mouth 2 (two) times daily.  dexamethasone 4 MG tablet Commonly known as: DECADRON Take 1 tablet (4 mg total) by mouth daily.   DULoxetine 60 MG capsule Commonly known as: CYMBALTA Take 60 mg by mouth daily.   Enulose 10 GM/15ML Soln Generic drug: lactulose (encephalopathy) TAKE 15 MLS (10 G TOTAL) BY MOUTH TWO (2) (TWO) TIMES DAILY AS NEEDED FOR MILD CONSTIPATION OR SEVERE CONSTIPATION.   KRILL OIL PO Take 900 mg by mouth daily.   lactulose 10 GM/15ML solution Commonly known as: CHRONULAC Take 15 mLs (10 g total) by mouth 4 (four) times daily as needed for mild constipation or severe constipation.   levothyroxine 100 MCG tablet Commonly known as: SYNTHROID Take 100 mcg by mouth daily before breakfast.   Linzess 72 MCG capsule Generic drug:  linaclotide   linaclotide 290 MCG Caps capsule Commonly known as: Linzess Take 1 capsule (290 mcg total) by mouth daily before breakfast.   lurasidone 40 MG Tabs tablet Commonly known as: LATUDA Take 40 mg by mouth daily with breakfast.   methadone 10 MG tablet Commonly known as: DOLOPHINE 1.5 tabs ( 15 mg ) bid and 1 tab at HS, 4 per day   pantoprazole 40 MG tablet Commonly known as: PROTONIX Take 1 tablet (40 mg total) by mouth 2 (two) times daily. What changed:   medication strength  how much to take  Another medication with the same name was removed. Continue taking this medication, and follow the directions you see here.   polyethylene glycol 17 g packet Commonly known as: MIRALAX / GLYCOLAX Take 17 g by mouth daily.   rosuvastatin 20 MG tablet Commonly known as: CRESTOR Take 20 mg by mouth daily.   senna-docusate 8.6-50 MG tablet Commonly known as: Senokot-S Take 1 tablet by mouth 2 (two) times daily.       Time coordinating discharge: 39 minutes  Signed:  Ontario Pettengill  [facility] 09/08/2019, 4:08 PM

## 2019-09-08 NOTE — H&P (Signed)
Physical Medicine and Rehabilitation Admission H&P    Chief Complaint  Patient presents with  . Functional decline due to brain mass with right foot drop    HPI: Renee [name] is a 74 year old female with history of CKD, HTN, CLL, breast cancer s/p lumpectomy with XRT, pudendal neuralgia, anxiety disorder who was admitted on 09/04/19 with 2 to 3-week history of multiple falls with progressive weakness affecting FMC of right hand and right foot drop.  MRI brain done showing 2 cm cystic lesion with vasogenic edema and question of tumor versus cerebritis.  She was transferred to [facility] for further work-up and evaluated by neurology.  She was started on Decadron and MRI brain with contrast showed 2 cm subcortical white matter lesion in parietal lobe without enhancement--cerebritis or subacute infarct felt to be unlikely.  LP done showing elevated glucose with 1 WBC and 89 RBC.  CSF sent for flow cytometry and JC virus.  Dr. Malen Gauze recommends biopsy with PET scan and Dr. Mickeal Skinner consulted for input and felt that CNS involvement and CLL was very uncommon.  He question nonneoplastic etiology such as vascular inflammatory process and recommend repeating contrast enhanced MRI in 1 month as well as decreasing Decadron to 4 mg daily at discharge.  Patient to be discussed at tumor board on 1/11.  Aggressive rehab recommended due to ongoing right-sided weakness affecting mobility and ADLs.  CIR recommended due to functional decline.    Per her daughter, daughter lives 40 minutes away, son lives with pt, but just "there" and cannot help a lot. Trying to arrange 24 hours care and will have it in place in 1-2 weeks.  LBM today- always constipated. Esp since they increased her Methadone for rectal pain 3-4 months ago.   Review of Systems  Respiratory: Positive for shortness of breath (DOE).   Gastrointestinal: Positive for constipation.  Musculoskeletal: Positive for falls.       Rectal pain- chronic   Neurological: Positive for sensory change (Left foot), focal weakness and weakness.  All other systems reviewed and are negative.     Past Medical History:  Diagnosis Date  . Chronic renal disease, stage 3, moderately decreased glomerular filtration rate between 30-59 mL/min/1.73 square meter 12/27/2017  . Constipation   . Depression   . History of breast cancer 12/27/2017  . Hypertension   . Leukemia ([place])    CLL  . PVD (peripheral vascular disease) ([place])   . Thyroid disease     Past Surgical History:  Procedure Laterality Date  . ABDOMINAL HYSTERECTOMY    . BREAST SURGERY     Left Breast Lumpectomy  . COLON SURGERY    . HERNIA REPAIR  2000   Hiatal hernia repair      Family History  Problem Relation Age of Onset  . Healthy Mother   . Healthy Father   . Healthy Sister        "normal stuff for her age"  . Pancreatic cancer Brother     Social History: Has a son who stays with her on and off.  She was independent and driving till 3 weeks prior to admission.  She hired help and started using a walker 2 weeks prior to admission.  She reports that she quit smoking about 39 years ago. Her smoking use included cigarettes. She started smoking about 57 years ago. She smoked 0.75 packs per day. She has never used smokeless tobacco. She reports that she does not drink alcohol or use  drugs.    Allergies  Allergen Reactions  . Bupropion Other (See Comments)    Fidgety   . Clonidine Derivatives Other (See Comments)  . Codeine Nausea Only  . Diclofenac-Misoprostol Other (See Comments)    Fidgety   . Olanzapine Other (See Comments)    Fidgety   . Pregabalin Other (See Comments) and Rash  . Valdecoxib Other (See Comments)  . Zonisamide Other (See Comments)    Fidgety     Medications Prior to Admission  Medication Sig Dispense Refill  . clonazePAM (KLONOPIN) 0.5 MG tablet Take 0.5 mg by mouth 2 (two) times daily.     . DULoxetine (CYMBALTA) 60 MG capsule Take 60 mg by mouth  daily.    [name] Kitchen ENULOSE 10 GM/15ML SOLN TAKE 15 MLS (10 G TOTAL) BY MOUTH TWO (2) (TWO) TIMES DAILY AS NEEDED FOR MILD CONSTIPATION OR SEVERE CONSTIPATION.  236 mL 0  . KRILL OIL PO Take 900 mg by mouth daily.    [name] Kitchen levothyroxine (SYNTHROID, LEVOTHROID) 100 MCG tablet Take 100 mcg by mouth daily before breakfast.    . linaclotide (LINZESS) 290 MCG CAPS capsule Take 1 capsule (290 mcg total) by mouth daily before breakfast. 30 capsule 1  . LINZESS 72 MCG capsule     . lurasidone (LATUDA) 40 MG TABS tablet Take 40 mg by mouth daily with breakfast.    . methadone (DOLOPHINE) 10 MG tablet 1.5 tabs ( 15 mg ) bid and 1 tab at HS, 4 per day    . pantoprazole (PROTONIX) 40 MG tablet Take 40 mg by mouth daily.    . polyethylene glycol (MIRALAX / GLYCOLAX) packet Take 17 g by mouth daily.    . rosuvastatin (CRESTOR) 20 MG tablet Take 20 mg by mouth daily.    [name] Kitchen senna-docusate (SENOKOT-S) 8.6-50 MG tablet Take 1 tablet by mouth 2 (two) times daily. 60 tablet 3  . Calcium Carbonate-Vitamin D (CALCIUM HIGH POTENCY/VITAMIN D) 600-200 MG-UNIT TABS Take by mouth daily.     [name] Kitchen lactulose (CHRONULAC) 10 GM/15ML solution Take 15 mLs (10 g total) by mouth 4 (four) times daily as needed for mild constipation or severe constipation. 236 mL 5  . lubiprostone (AMITIZA) 8 MCG capsule Take 1 capsule (8 mcg total) by mouth 2 (two) times daily with a meal. 60 capsule 0  . morphine (MSIR) 15 MG tablet Take 15 mg by mouth 4 (four) times daily as needed.    . pantoprazole (PROTONIX) 20 MG tablet Take 1 tablet (20 mg total) by mouth 2 (two) times daily. 60 tablet 1    Drug Regimen Review  Drug regimen was reviewed and remains appropriate with no significant issues identified  Home: Home Living Family/patient expects to be discharged to:: Private residence Living Arrangements: Children(son who is 10 years old has lived with her for 2 to 3 years) Available Help at Discharge: Family, Available 24 hours/day(daughter and pt aware that  24/7 supervision will be needed) Type of Home: House Home Access: Stairs to enter [employer] of Steps: 2 Home Layout: Two level, Able to live on main level with bedroom/bathroom Bathroom Shower/Tub: Chiropodist: Handicapped height Bathroom Accessibility: Yes Home Equipment: Bedside commode, Environmental consultant - 2 wheels, Wheelchair - manual  Lives With: Other (Comment)(son lives with patient)   Functional History: Prior Function Level of Independence: Needs assistance Gait / Transfers Assistance Needed: using walker, frequent falls ADL's / Homemaking Assistance Needed: Been doing sponge baths. Aide coming on Tu/Th to help with bathing,  dressing, cooking/cleaning, laundry. Pt has required increased assist with ADL's over past week in setting of frequent falling. Comments: had hired aide Tuesday and Fridays. Aware that she will need 24/7 asisst  Functional Status:  Mobility: Bed Mobility Overal bed mobility: Needs Assistance Bed Mobility: Sit to Supine Supine to sit: Min assist Sit to supine: Mod assist General bed mobility comments: Mod assistance to lift B feet back to bed against gravity.  Pt performed boosting to [facility] with B UEs and bed placed in trendelberg position. Transfers Overall transfer level: Needs assistance Equipment used: Rolling walker (2 wheeled) Transfers: Sit to/from Stand Sit to Stand: Mod assist Stand pivot transfers: Max assist General transfer comment: MOd assistance to rise into standing.  Pt required assistance for R hand hand placement and assistance to adjust RLE to achieve standing.  Perfomed x 3 trials.  Pt limited due to R sided weakness and ataxia Ambulation/Gait Ambulation/Gait assistance: Mod assist, +2 physical assistance(Max +1 for 6 ft and mod +2 for 8 ft) Gait Distance (Feet): 6 Feet(8 ft) Assistive device: Rolling walker (2 wheeled) Gait Pattern/deviations: Decreased stance time - right, Decreased dorsiflexion - right,  Step-to pattern, Decreased weight shift to left General Gait Details: Pt performed 6 ft with poor foot clearance and dorsiflexion on her R side. She required assistance to weight shift L and advance R foot forward.  Gt distance limited due to need to have BM.  Once in sitting applied ace wrap to R side to achieve dorsiflexion. Gait velocity: decreasecd Gait velocity interpretation: <1.8 ft/sec, indicate of risk for recurrent falls    ADL: ADL Overall ADL's : Needs assistance/impaired Eating/Feeding: Set up, Sitting Grooming: Oral care, Maximal assistance, Standing, Brushing hair, Sitting, Min guard Grooming Details (indicate cue type and reason): Pt requiring Max A for standing balance while at sink performing oral care. Pt with significant lean to right and required cues for correction. Decreased coorindation and grasp strength at RUE as she managed tooth brush and tooth paste. Able to brush her hair while seated in recliner Upper Body Bathing: Minimal assistance, Sitting Lower Body Bathing: Moderate assistance, +2 for physical assistance, Sit to/from stand Upper Body Dressing : Minimal assistance, Sitting Lower Body Dressing: Moderate assistance, +2 for safety/equipment, Sit to/from stand Lower Body Dressing Details (indicate cue type and reason): Pt donning her left shoe with Mod A for postural control and sitting balance (lateral leaning to right). Requriing assistance to bring right ankle to knee and then Mod A for sitting balance when donning right shoe.  Toilet Transfer: Moderate assistance, +2 for physical assistance, Ambulation, BSC, RW Toilet Transfer Details (indicate cue type and reason): Mod A for power up and to maintain balance during mobility. Cues for hand placement at [place] and Hygiene: Moderate assistance, +2 for physical assistance, Sit to/from stand Toileting - Clothing Manipulation Details (indicate cue type and reason): Pt able to perform peri  care in standing with Mod A +2 for standing balance Functional mobility during ADLs: Moderate assistance, +2 for physical assistance, Rolling walker General ADL Comments: Pt presenting with decreased balance, strength, and coorindation at RUE/RLE.   Cognition: Cognition Overall Cognitive Status: Impaired/Different from baseline Orientation Level: Oriented X4 Cognition Arousal/Alertness: Awake/alert Behavior During Therapy: WFL for tasks assessed/performed Overall Cognitive Status: Impaired/Different from baseline Area of Impairment: Attention, Awareness Current Attention Level: Sustained Awareness: Intellectual, Anticipatory General Comments: pt with decreased attention to and awareness of R side during mobility, cues for attention to R UE. Decreased awareness of  R lateral lean at times, cues to correct   Blood pressure 117/67, pulse 75, temperature 98.7 F (37.1 C), temperature source Oral, resp. rate 16, height 6\' 1"  (1.854 m), weight 83.5 kg, SpO2 96 %. Physical Exam  Nursing note and vitals reviewed. Constitutional: She is oriented to person, place, and time. She appears well-developed and well-nourished. No distress.  Lying supine in bed with daughter at bedside, NAD R handed  HENT:  Head: Normocephalic and atraumatic.  Mouth/Throat: No oropharyngeal exudate.  Face symmetrical- tongue midline; smile equal  Eyes: Pupils are equal, round, and reactive to light. Conjunctivae and EOM are normal. No scleral icterus.  Neck: No tracheal deviation present.  Cardiovascular:  RRR  Respiratory:  CTA B/L  GI:  Soft, NT, ND, (+) hypoactive BS  Genitourinary:    Genitourinary Comments: Has purwick   Musculoskeletal:     Cervical back: Normal range of motion and neck supple.     Comments: LUE 5/5 in deltoid,biceps,triceps, WE, grip and finger abd RUE- delt/bicep 5-/5, WE 4+/5, tricep 5-/5, grip 4+/5, finger abd 4+/5  LLE_ 5/5 in HF/KE/KF/DF/PF/EHL RLE- HF 2/5, KE/KF 2/5, DF 2-/5,  PF 2-/5, EHL 0/5  Getting contracture of R ankle- has ~ 45-60 degrees of R ankle DF- lacking significant movement lacking neutral  Neurological: She is alert and oriented to person, place, and time. She exhibits normal muscle tone.  NO increased tone RUE, LUE, LLE but MAS of RLE is 2 of knee and hip No hoffman's B/L No clonus, but R ankle is contracted  Skin: Skin is warm and dry. She is not diaphoretic.  Psychiatric: She has a normal mood and affect.    Results for orders placed or performed during the hospital encounter of 09/03/19 (from the past 48 hour(s))  Glucose, capillary     Status: Abnormal   Collection Time: 09/06/19  4:18 PM  Result Value Ref Range   Glucose-Capillary 175 (H) 70 - 99 mg/dL  Glucose, capillary     Status: Abnormal   Collection Time: 09/06/19  9:12 PM  Result Value Ref Range   Glucose-Capillary 165 (H) 70 - 99 mg/dL  Glucose, capillary     Status: Abnormal   Collection Time: 09/07/19  6:18 AM  Result Value Ref Range   Glucose-Capillary 112 (H) 70 - 99 mg/dL  Glucose, capillary     Status: Abnormal   Collection Time: 09/07/19 11:47 AM  Result Value Ref Range   Glucose-Capillary 104 (H) 70 - 99 mg/dL  Glucose, capillary     Status: Abnormal   Collection Time: 09/07/19  4:18 PM  Result Value Ref Range   Glucose-Capillary 105 (H) 70 - 99 mg/dL  Glucose, capillary     Status: Abnormal   Collection Time: 09/07/19  9:23 PM  Result Value Ref Range   Glucose-Capillary 157 (H) 70 - 99 mg/dL  Basic metabolic panel     Status: Abnormal   Collection Time: 09/08/19  3:13 AM  Result Value Ref Range   Sodium 139 135 - 145 mmol/L   Potassium 4.6 3.5 - 5.1 mmol/L   Chloride 105 98 - 111 mmol/L   CO2 24 22 - 32 mmol/L   Glucose, Bld 133 (H) 70 - 99 mg/dL   BUN 22 8 - 23 mg/dL   Creatinine, Ser 1.03 (H) 0.44 - 1.00 mg/dL   Calcium 8.9 8.9 - 10.3 mg/dL   GFR calc non Af Amer 55 (L) >60 mL/min   GFR calc Af Amer >60 >60  mL/min   Anion gap 10 5 - 15    Comment:  Performed at [facility] [address], [place], [place] [address]  Magnesium     Status: None   Collection Time: 09/08/19  3:13 AM  Result Value Ref Range   Magnesium 2.3 1.7 - 2.4 mg/dL    Comment: Performed at [facility] [address] [place], [place] [address]  Glucose, capillary     Status: Abnormal   Collection Time: 09/08/19  6:04 AM  Result Value Ref Range   Glucose-Capillary 107 (H) 70 - 99 mg/dL  Glucose, capillary     Status: None   Collection Time: 09/08/19 12:24 PM  Result Value Ref Range   Glucose-Capillary 93 70 - 99 mg/dL   Comment 1 Notify RN    Comment 2 Document in Chart    No results found.     Medical Problem List and Plan: 1.  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  Antithrombotics: -DVT/anticoagulation:  Pharmaceutical: Lovenox  -antiplatelet therapy: n/a 3. Chronic pudendal pain/Pain Management: cymbalta with Continue methadone bid 4. Mood: LCSW to follow for evaluation and support.   -antipsychotic agents:  N/A 5. Neuropsych: This patient is? capable of making decisions on her own behalf. 6. Skin/Wound Care:  Routine pressure relief measures.  7. Fluids/Electrolytes/Nutrition: Monitor I/O 8. 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 Improved.  9.  Hypothyroid: Resume supplement.   10. H/o depression: On Lutuda, cymbalta and Clonazepam 11. 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 On Keppra and decadron-->tapered to bid on 1/5   12. Steroid induced hyperglycemia: Hgb A1c-5.4. Recommendations to reduce decadron to 4 mg at discharge.  13. OIC: On multiple medications--Laculose, Linzess, Miralax and senna--> increase to 2 bid 14. Breast cancer in remission/CLL being observed: continue to monitor white count 79.9-->  80.9 14. Iron deficiently anemia: Add iron supplement 15. Spasticity- /contracture  formation of R ankle- suggest Baclofen 5 mg TID to slow contracture formation of R ankle, but would assess as well for Botox of R ankle since has forming PF contracture due to spasticity and weakness- explained side effects of Baclofen, but explained usually not severe constipation at low doses- per primary rehab team.   Bary Leriche, PA-C 09/08/2019

## 2019-09-08 NOTE — PMR Pre-admission (Addendum)
PMR Admission Coordinator Pre-Admission Assessment  Patient: Renee [name] is an 74 y.o., female MRN: [id number] DOB: 01-05-1946 Height: 6\' 1"  (185.4 cm) Weight: 83.5 kg              Insurance Information HMO:     PPO: yes     PCP:      IPA:      80/20:      OTHER: medicare advantage plan PRIMARY: [place] medicare advantage [employer] for retirees of the [employer]      Policy#: [id number]      Subscriber: pt CM Name: Rollene Fare      Phone#: [phone] ext [phone]     Fax#: [phone] Pre-Cert#: [id number] approved until 1/8 when updates are due      Employer:  Benefits:  Phone #: [phone]     Name: 09/07/2019 Eff. Date: 09/03/2014 and active     Deduct: $150      Out of Pocket Max: $1500 includes deductible      Life Max: none CIR: 95%      SNF: 95%. Limit 100 days per year Outpatient: $25 per visit     Co-Pay: visits per medical neccesity Home Health: 95%      Co-Pay: visits per medical neccesity DME: 95%     Co-Pay: 5% Providers: in network  SECONDARY: none       Medicaid Application Date:       Case Manager:  Disability Application Date:       Case Worker:   The "Data Collection Information Summary" for patients in Inpatient Rehabilitation Facilities with attached "Privacy Act [place] Records" was provided and verbally reviewed with: Patient and Family  Emergency Contact Information Contact Information    Name Relation Home Work Mobile   [name],[name] Daughter  [phone]     Current Medical History  Patient Admitting Diagnosis: 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 female with history of CKD, HTN, CLL,left breast cancer s/p lumpectomy with XRT '99, neuralgia, chronic rectal pain, anxiety disorder, PVD who was admitted on 09/04/19 with 2-3 week history of multiple falls with progressive weakness affecting [place] or right hand and right foot drop. MRI brain done showing 2 cm  cystic lesion with vasogenic edema and question of tumor v/s cerebritis. She was transferred to [facility] for further workup. She was evaluated by neurology, started on decadron and MRI brain with contrast 2 cm subcortical white matter lesion in parietal lobe without enhancement --cerebritis or subacute infarct felt to be unlikely. UDS negative. LP done for work up showing elevated glucose and 1 WBC with 89 RBC. Dr. Rory Percy recommends biopsy with PET scan and follow up with Dr. Marijo Sanes be done on outpatient basis.  Two weeks ago patient hired aide and required walker due to falls/inability to care for herself. Was independent without AD prior to that.  Therapy evaluations completed revealing decreased balance, right lateral lean and right sided weakness affecting functional status. Dr. Mickeal Skinner with oncology consulted on 09/07/2019 with recommendations to repeat contrast enhanced brain MRI in 1 month. To defer biopsy for now. Reduce Decadron to 4 mg daily at discharge. Case to be discussed further in brain/spine tumor board meeting on 09/14/2019.    Complete NIHSS TOTAL: 1    Past Medical History  Past Medical History:  Diagnosis Date  . Chronic renal disease, stage 3, moderately decreased glomerular filtration rate between 30-59 mL/min/1.73 square meter 12/27/2017  . Constipation   .  Depression   . History of breast cancer 12/27/2017  . Hypertension   . 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 PVD (peripheral vascular disease) ([place])   . Thyroid disease     Family History  family history includes Healthy in her father, mother, and sister; Pancreatic cancer in her brother.  Prior Rehab/Hospitalizations:  Has the patient had prior rehab or hospitalizations prior to admission? Yes  Has the patient had major surgery during 100 days prior to admission? No  Current Medications   Current Facility-Administered Medications:  .  acetaminophen (TYLENOL) tablet 650 mg, 650 mg, Oral, Q4H PRN **OR** acetaminophen (TYLENOL) 160  MG/5ML solution 650 mg, 650 mg, Per Tube, Q4H PRN **OR** acetaminophen (TYLENOL) suppository 650 mg, 650 mg, Rectal, Q4H PRN, Norins, Heinz Knuckles, MD .  bisacodyl (DULCOLAX) suppository 10 mg, 10 mg, Rectal, Daily PRN, Sheikh, Omair Latif, DO .  clonazePAM Bobbye [place]) tablet 0.5 mg, 0.5 mg, Oral, BID, Norins, Heinz Knuckles, MD, 0.5 mg at 09/07/19 2116 .  dexamethasone (DECADRON) tablet 4 mg, 4 mg, Oral, Q12H, Pokhrel, Laxman, MD .  DULoxetine (CYMBALTA) DR capsule 60 mg, 60 mg, Oral, Daily, Norins, Heinz Knuckles, MD, 60 mg at 09/08/19 [id number] .  enoxaparin (LOVENOX) injection 40 mg, 40 mg, Subcutaneous, Q24H, Pokhrel, Laxman, MD, 40 mg at 09/08/19 1436 .  feeding supplement (ENSURE ENLIVE) (ENSURE ENLIVE) liquid 237 mL, 237 mL, Oral, BID BM, Sheikh, Omair Latif, DO, 237 mL at 09/08/19 1436 .  insulin aspart (novoLOG) injection 0-5 Units, 0-5 Units, Subcutaneous, QHS, Emokpae, Courage, MD .  insulin aspart (novoLOG) injection 0-6 Units, 0-6 Units, Subcutaneous, TID WC, Emokpae, Courage, MD, 1 Units at 09/06/19 1658 .  lactulose (CHRONULAC) 10 GM/15ML solution 10 g, 10 g, Oral, BID, Emokpae, Courage, MD, 10 g at 09/08/19 0922 .  levothyroxine (SYNTHROID) tablet 100 mcg, 100 mcg, Oral, QAC breakfast, Norins, Heinz Knuckles, MD, 100 mcg at 09/08/19 [id number] .  linaclotide (LINZESS) capsule 290 mcg, 290 mcg, Oral, QAC breakfast, Norins, Heinz Knuckles, MD, 290 mcg at 09/08/19 [phone] .  lurasidone (LATUDA) tablet 40 mg, 40 mg, Oral, QHS, Norins, Heinz Knuckles, MD, 40 mg at 09/07/19 2116 .  methadone (DOLOPHINE) tablet 15 mg, 15 mg, Oral, Daily, 15 mg at 09/08/19 [id number] **AND** methadone (DOLOPHINE) tablet 10 mg, 10 mg, Oral, q1800, Norins, Heinz Knuckles, MD, 10 mg at 09/07/19 1741 .  pantoprazole (PROTONIX) EC tablet 40 mg, 40 mg, Oral, BID, Norins, Heinz Knuckles, MD, 40 mg at 09/08/19 [id number] .  polyethylene glycol (MIRALAX / GLYCOLAX) packet 17 g, 17 g, Oral, BID, Raiford Noble Groom, DO, 17 g at 09/08/19 [id number] .  rosuvastatin (CRESTOR) tablet 20 mg,  20 mg, Oral, q1800, Norins, Heinz Knuckles, MD, 20 mg at 09/07/19 1742 .  senna-docusate (Senokot-S) tablet 1 tablet, 1 tablet, Oral, BID, Raiford Noble [place], [place], 1 tablet at 09/08/19 [id number]  Patients Current Diet:  Diet Order            Diet Heart Room service appropriate? Yes; Fluid consistency: Thin  Diet effective now              Precautions / Restrictions Precautions Precautions: Fall Precaution Comments: Significant weakness at RLE, impaired R UE proprioception Restrictions Weight Bearing Restrictions: No   Has the patient had 2 or more falls or a fall with injury in the past year?Yes  Prior Activity Level Limited Community (1-2x/wk): did not drive; needed some assist. Was independent prior to 4 weeks ago. Has functional declined, hired assist at  home to help.  Prior Functional Level Prior Function Level of Independence: Needs assistance Gait / Transfers Assistance Needed: using walker, frequent falls ADL's / Homemaking Assistance Needed: Been doing sponge baths. Aide coming on Tu/Th to help with bathing, dressing, cooking/cleaning, laundry. Pt has required increased assist with ADL's over past week in setting of frequent falling. Comments: had hired aide Tuesday and Fridays. Aware that she will need 24/7 asisst  Self Care: Did the patient need help bathing, dressing, using the toilet or eating?  Needed some help  Indoor Mobility: Did the patient need assistance with walking from room to room (with or without device)? Needed some help  Stairs: Did the patient need assistance with internal or external stairs (with or without device)? Needed some help  Functional Cognition: Did the patient need help planning regular tasks such as shopping or remembering to take medications? Needed some help  Home Assistive Devices / Equipment Home Assistive Devices/Equipment: Eyeglasses, Scales, Environmental consultant (specify type) Home Equipment: Bedside commode, Walker - 2 wheels, Wheelchair -  manual  Prior Device Use: Indicate devices/aids used by the patient prior to current illness, exacerbation or injury? Walker  Current Functional Level Cognition  Overall Cognitive Status: Impaired/Different from baseline Current Attention Level: Sustained Orientation Level: Oriented X4 General Comments: pt with decreased attention to and awareness of R side during mobility, cues for attention to R UE. Decreased awareness of R lateral lean at times, cues to correct    Extremity Assessment (includes Sensation/Coordination)  Upper Extremity Assessment: RUE deficits/detail RUE Deficits / Details: Decreased strength and coordination as seen during ADLs, MMT, and finger to nose.  RUE Coordination: decreased fine motor, decreased gross motor  Lower Extremity Assessment: RLE deficits/detail, LLE deficits/detail RLE Deficits / Details: Hip flexion 2/5, knee extension 4/5, ankle dorsiflexion 4/5. No sensation to light touch distal to knee RLE Sensation: decreased light touch, decreased proprioception RLE Coordination: decreased gross motor LLE Deficits / Details: WFL    ADLs  Overall ADL's : Needs assistance/impaired Eating/Feeding: Set up, Sitting Grooming: Oral care, Maximal assistance, Standing, Brushing hair, Sitting, Min guard Grooming Details (indicate cue type and reason): Pt requiring Max A for standing balance while at sink performing oral care. Pt with significant lean to right and required cues for correction. Decreased coorindation and grasp strength at RUE as she managed tooth brush and tooth paste. Able to brush her hair while seated in recliner Upper Body Bathing: Minimal assistance, Sitting Lower Body Bathing: Moderate assistance, +2 for physical assistance, Sit to/from stand Upper Body Dressing : Minimal assistance, Sitting Lower Body Dressing: Moderate assistance, +2 for safety/equipment, Sit to/from stand Lower Body Dressing Details (indicate cue type and reason): Pt donning  her left shoe with Mod A for postural control and sitting balance (lateral leaning to right). Requriing assistance to bring right ankle to knee and then Mod A for sitting balance when donning right shoe.  Toilet Transfer: Moderate assistance, +2 for physical assistance, Ambulation, BSC, RW Toilet Transfer Details (indicate cue type and reason): Mod A for power up and to maintain balance during mobility. Cues for hand placement at [place] and Hygiene: Moderate assistance, +2 for physical assistance, Sit to/from stand Toileting - Clothing Manipulation Details (indicate cue type and reason): Pt able to perform peri care in standing with Mod A +2 for standing balance Functional mobility during ADLs: Moderate assistance, +2 for physical assistance, Rolling walker General ADL Comments: Pt presenting with decreased balance, strength, and coorindation at RUE/RLE.  Mobility  Overal bed mobility: Needs Assistance Bed Mobility: Sit to Supine Supine to sit: Min assist Sit to supine: Mod assist General bed mobility comments: Mod assistance to lift B feet back to bed against gravity.  Pt performed boosting to [facility] with B UEs and bed placed in trendelberg position.    Transfers  Overall transfer level: Needs assistance Equipment used: Rolling walker (2 wheeled) Transfers: Sit to/from Stand Sit to Stand: Mod assist Stand pivot transfers: Max assist General transfer comment: MOd assistance to rise into standing.  Pt required assistance for R hand hand placement and assistance to adjust RLE to achieve standing.  Perfomed x 3 trials.  Pt limited due to R sided weakness and ataxia    Ambulation / Gait / Stairs / Wheelchair Mobility  Ambulation/Gait Ambulation/Gait assistance: Mod assist, +2 physical assistance(Max +1 for 6 ft and mod +2 for 8 ft) Gait Distance (Feet): 6 Feet(8 ft) Assistive device: Rolling walker (2 wheeled) Gait Pattern/deviations: Decreased stance time -  right, Decreased dorsiflexion - right, Step-to pattern, Decreased weight shift to left General Gait Details: Pt performed 6 ft with poor foot clearance and dorsiflexion on her R side. She required assistance to weight shift L and advance R foot forward.  Gt distance limited due to need to have BM.  Once in sitting applied ace wrap to R side to achieve dorsiflexion. Gait velocity: decreasecd Gait velocity interpretation: <1.8 ft/sec, indicate of risk for recurrent falls    Posture / Balance Dynamic Sitting Balance Sitting balance - Comments: intermittent CGA-min assist needed at times to correct R lateral lean and return to midline, cues for awareness. Balance Overall balance assessment: Needs assistance Sitting-balance support: No upper extremity supported, Feet supported Sitting balance-Leahy Scale: Poor Sitting balance - Comments: intermittent CGA-min assist needed at times to correct R lateral lean and return to midline, cues for awareness. Postural control: Right lateral lean Standing balance support: Bilateral upper extremity supported, During functional activity, No upper extremity supported Standing balance-Leahy Scale: Poor Standing balance comment: Reliant on physical A and UE support    Special needs/care consideration BiPAP/CPAP CPM Continuous Drip IV Dialysis         Life Vest Oxygen Special Bed Trach Size Wound Vac  Skin ecchymosis to bilateral arms and right hip Bowel mgmt: no LBM noted Bladder mgmt: external catheter Diabetic mgmt Behavioral consideration  Chemo/radiation  Designated visitor is daughter, [name]   Previous Home Environment  Living Arrangements: Children(son who is 61 years old has lived with her for 2 to 3 years)  Lives With: Other (Comment)(son lives with patient) Available Help at Discharge: Family, Available 24 hours/day(daughter and pt aware that 24/7 supervision will be needed) Type of Home: House Home Layout: Two level, Able to live on main  level with bedroom/bathroom Home Access: Stairs to enter [employer] of Steps: 2 Bathroom Shower/Tub: Chiropodist: Handicapped height Bathroom Accessibility: Yes How Accessible: Accessible via walker [place]: Yes Type of Home Care Services: [place] (if known): ( private duty once a month) Has hired aide for last 3 weeks  Tuesday and Thursdays  Discharge Living Setting Plans for Discharge Living Setting: Patient's home, Other (Comment)(son, Aaron Edelman lives with pt for past 2 to 3 years) Type of Home at Discharge: House Discharge Home Layout: Two level, Able to live on main level with bedroom/bathroom Discharge Home Access: Stairs to enter Entrance Stairs-Number of Steps: 2 Discharge Bathroom Shower/Tub: Tub/shower unit Discharge Bathroom Toilet: Handicapped height  Discharge Bathroom Accessibility: Yes How Accessible: Accessible via walker Does the patient have any problems obtaining your medications?: No  Social/Family/Support Systems Patient Roles: Parent Contact Information: daughter, [name] Anticipated Caregiver: Rosario Jacks, hired aide Anticipated Caregiver's Contact Information: see above Ability/Limitations of Caregiver: Aaron Edelman works part time nights; [name] works days at [place] center Caregiver Availability: 24/7 Discharge Plan Discussed with Primary Caregiver: Yes Is Caregiver In Agreement with Plan?: Yes Does Caregiver/Family have Issues with Lodging/Transportation while Pt is in Rehab?: No  Goals/Additional Needs Patient/Family Goal for Rehab: supervision PT and OT Expected length of stay: ELOS 10 to 14 days Special Service Needs: will need outpatient f/u with oncology and for PET scans Pt/Family Agrees to Admission and willing to participate: Yes Program Orientation Provided & Reviewed with Pt/Caregiver Including Roles  & Responsibilities: Yes  Decrease burden of Care through IP rehab  admission:  Possible need for SNF placement upon discharge:  Patient Condition: This patient's condition remains as documented in the consult dated 09/07/2019, in which the Rehabilitation Physician determined and documented that the patient's condition is appropriate for intensive rehabilitative care in an inpatient rehabilitation facility. Will admit to inpatient rehab today.  Preadmission Screen Completed By:  Cleatrice Burke, RN, 09/08/2019 2:37 PM ______________________________________________________________________   Discussed status with Dr. Dagoberto Ligas on 09/08/2019 at  1621 and received approval for admission today.  Admission Coordinator:  Cleatrice Burke, time [id number] Date 09/08/2019

## 2019-09-08 NOTE — Progress Notes (Signed)
Renee Gong, RN  Rehab Admission Coordinator  Physical Medicine and Rehabilitation  PMR Pre-admission  Addendum  Date of Service:  09/08/2019  2:37 PM      Related encounter: ED to Hosp-Admission (Current) from 09/03/2019 in [facility]        Show:Clear all [x] Manual[x] Template[x] Copied  Added by: [x] Julious Payer Vertis Kelch, RN  [] Hover for details PMR Admission Coordinator Pre-Admission Assessment   Patient: Renee [name] is an 74 y.o., female MRN: [id number] DOB: [birth year] Height: 6\' 1"  (185.4 cm) Weight: 83.5 kg                                                                                                                                                  Insurance Information HMO:     PPO: yes     PCP:      IPA:      80/20:      OTHER: medicare advantage plan PRIMARY: [place] medicare advantage Freedom Blue for retirees of the [employer]      Policy#: [id number]      Subscriber: pt CM Name: Renee [name]      Phone#: [phone] ext [phone]     Fax#: [phone] Pre-Cert#: [id number] approved until 1/8 when updates are due      Employer:  Benefits:  Phone #: [phone]     Name: 09/07/2019 Eff. Date: 09/03/2014 and active     Deduct: $150      Out of Pocket Max: $1500 includes deductible      Life Max: none CIR: 95%      SNF: 95%. Limit 100 days per year Outpatient: $25 per visit     Co-Pay: visits per medical neccesity Home Health: 95%      Co-Pay: visits per medical neccesity DME: 95%     Co-Pay: 5% Providers: in network  SECONDARY: none        Medicaid Application Date:       Case Manager:  Disability Application Date:       Case Worker:    The "Data Collection Information Summary" for patients in Inpatient Rehabilitation Facilities with attached "Privacy Act [place] Records" was provided and verbally reviewed with: Patient and Family   Emergency Contact Information         Contact Information     Name  Relation Home Work Mobile    [name],Renee Daughter   [phone]       Current Medical History  Patient Admitting Diagnosis: Renee [name] 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 female with history of CKD, HTN, CLL,left breast cancer s/p lumpectomy with XRT '99, neuralgia, chronic rectal pain, anxiety disorder, PVD who was admitted on 09/04/19 with 2-3 week history of multiple falls with progressive weakness affecting Renee [name] or Renee [name] hand and Renee [name] foot  drop. MRI brain done showing 2 cm cystic lesion with vasogenic edema and question of tumor v/s cerebritis. She was transferred to [facility] for further workup. She was evaluated by neurology, started on decadron and MRI brain with contrast 2 cm subcortical white matter lesion in parietal lobe without enhancement --cerebritis or subacute infarct felt to be unlikely. UDS negative. LP done for work up showing elevated glucose and 1 WBC with 89 RBC. Dr. Rory Percy recommends biopsy with PET scan and follow up with Dr. Marijo Sanes be done on outpatient basis.  Two weeks ago patient hired aide and required walker due to falls/inability to care for herself. Was independent without AD prior to that.  Therapy evaluations completed revealing decreased balance, Renee [name] lateral lean and Renee [name] sided weakness affecting functional status. Dr. Mickeal Skinner with oncology consulted on 09/07/2019 with recommendations to repeat contrast enhanced brain MRI in 1 month. To defer biopsy for now. Reduce Decadron to 4 mg daily at discharge. Case to be discussed further in brain/spine tumor board meeting on 09/14/2019.     Complete NIHSS TOTAL: 1   Past Medical History      Past Medical History:  Diagnosis Date  . Chronic renal disease, stage 3, moderately decreased glomerular filtration rate between 30-59 mL/min/1.73 square meter 12/27/2017  . Constipation    . Depression    . History of breast cancer 12/27/2017  . Hypertension    . 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 PVD (peripheral vascular disease) ([place])    . Thyroid disease        Family History  family history includes Healthy in her father, mother, and sister; Pancreatic cancer in her brother.   Prior Rehab/Hospitalizations:  Has the patient had prior rehab or hospitalizations prior to admission? Yes   Has the patient had major surgery during 100 days prior to admission? No   Current Medications    Current Facility-Administered Medications:  .  acetaminophen (TYLENOL) tablet 650 mg, 650 mg, Oral, Q4H PRN **OR** acetaminophen (TYLENOL) 160 MG/5ML solution 650 mg, 650 mg, Per Tube, Q4H PRN **OR** acetaminophen (TYLENOL) suppository 650 mg, 650 mg, Rectal, Q4H PRN, Norins, Heinz Knuckles, MD .  bisacodyl (DULCOLAX) suppository 10 mg, 10 mg, Rectal, Daily PRN, Sheikh, Omair Latif, DO .  clonazePAM Bobbye [place]) tablet 0.5 mg, 0.5 mg, Oral, BID, Norins, Heinz Knuckles, MD, 0.5 mg at 09/07/19 2116 .  dexamethasone (DECADRON) tablet 4 mg, 4 mg, Oral, Q12H, Pokhrel, Laxman, MD .  DULoxetine (CYMBALTA) DR capsule 60 mg, 60 mg, Oral, Daily, Norins, Heinz Knuckles, MD, 60 mg at 09/08/19 [id number] .  enoxaparin (LOVENOX) injection 40 mg, 40 mg, Subcutaneous, Q24H, Pokhrel, Laxman, MD, 40 mg at 09/08/19 1436 .  feeding supplement (ENSURE ENLIVE) (ENSURE ENLIVE) liquid 237 mL, 237 mL, Oral, BID BM, Sheikh, Omair Latif, DO, 237 mL at 09/08/19 1436 .  insulin aspart (novoLOG) injection 0-5 Units, 0-5 Units, Subcutaneous, QHS, Emokpae, Courage, MD .  insulin aspart (novoLOG) injection 0-6 Units, 0-6 Units, Subcutaneous, TID WC, Emokpae, Courage, MD, 1 Units at 09/06/19 1658 .  lactulose (CHRONULAC) 10 GM/15ML solution 10 g, 10 g, Oral, BID, Emokpae, Courage, MD, 10 g at 09/08/19 0922 .  levothyroxine (SYNTHROID) tablet 100 mcg, 100 mcg, Oral, QAC breakfast, Norins, Heinz Knuckles, MD, 100 mcg at 09/08/19 [id number] .  linaclotide (LINZESS) capsule 290 mcg, 290 mcg, Oral, QAC breakfast, Norins, Heinz Knuckles, MD, 290 mcg at 09/08/19 [phone] .   lurasidone (LATUDA) tablet 40 mg, 40 mg, Oral, QHS, Norins,  Heinz Knuckles, MD, 40 mg at 09/07/19 2116 .  methadone (DOLOPHINE) tablet 15 mg, 15 mg, Oral, Daily, 15 mg at 09/08/19 [id number] **AND** methadone (DOLOPHINE) tablet 10 mg, 10 mg, Oral, q1800, Norins, Heinz Knuckles, MD, 10 mg at 09/07/19 1741 .  pantoprazole (PROTONIX) EC tablet 40 mg, 40 mg, Oral, BID, Norins, Heinz Knuckles, MD, 40 mg at 09/08/19 [id number] .  polyethylene glycol (MIRALAX / GLYCOLAX) packet 17 g, 17 g, Oral, BID, Raiford Noble Lupus, DO, 17 g at 09/08/19 [id number] .  rosuvastatin (CRESTOR) tablet 20 mg, 20 mg, Oral, q1800, Norins, Heinz Knuckles, MD, 20 mg at 09/07/19 1742 .  senna-docusate (Senokot-S) tablet 1 tablet, 1 tablet, Oral, BID, Raiford Noble [place], 1 tablet at 09/08/19 [id number]   Patients Current Diet:     Diet Order                      Diet Heart Room service appropriate? Yes; Fluid consistency: Thin  Diet effective now                   Precautions / Restrictions Precautions Precautions: Fall Precaution Comments: Significant weakness at RLE, impaired R UE proprioception Restrictions Weight Bearing Restrictions: No    Has the patient had 2 or more falls or a fall with injury in the past year?Yes   Prior Activity Level Limited Community (1-2x/wk): did not drive; needed some assist. Was independent prior to 4 weeks ago. Has functional declined, hired assist at home to help.   Prior Functional Level Prior Function Level of Independence: Needs assistance Gait / Transfers Assistance Needed: using walker, frequent falls ADL's / Homemaking Assistance Needed: Been doing sponge baths. Aide coming on Tu/Th to help with bathing, dressing, cooking/cleaning, laundry. Pt has required increased assist with ADL's over past week in setting of frequent falling. Comments: had hired aide Tuesday and Fridays. Aware that she will need 24/7 asisst   Self Care: Did the patient need help bathing, dressing, using the toilet or eating?  Needed  some help   Indoor Mobility: Did the patient need assistance with walking from room to room (with or without device)? Needed some help   Stairs: Did the patient need assistance with internal or external stairs (with or without device)? Needed some help   Functional Cognition: Did the patient need help planning regular tasks such as shopping or remembering to take medications? Needed some help   Home Assistive Devices / Equipment Home Assistive Devices/Equipment: Eyeglasses, Scales, Environmental consultant (specify type) Home Equipment: Bedside commode, Walker - 2 wheels, Wheelchair - manual   Prior Device Use: Indicate devices/aids used by the patient prior to current illness, exacerbation or injury? Walker   Current Functional Level Cognition   Overall Cognitive Status: Impaired/Different from baseline Current Attention Level: Sustained Orientation Level: Oriented X4 General Comments: pt with decreased attention to and awareness of R side during mobility, cues for attention to R UE. Decreased awareness of R lateral lean at times, cues to correct    Extremity Assessment (includes Sensation/Coordination)   Upper Extremity Assessment: RUE deficits/detail RUE Deficits / Details: Decreased strength and coordination as seen during ADLs, MMT, and finger to nose.  RUE Coordination: decreased fine motor, decreased gross motor  Lower Extremity Assessment: RLE deficits/detail, LLE deficits/detail RLE Deficits / Details: Hip flexion 2/5, knee extension 4/5, ankle dorsiflexion 4/5. No sensation to light touch distal to knee RLE Sensation: decreased light touch, decreased proprioception RLE Coordination: decreased gross motor LLE Deficits / Details:  WFL     ADLs   Overall ADL's : Needs assistance/impaired Eating/Feeding: Set up, Sitting Grooming: Oral care, Maximal assistance, Standing, Brushing hair, Sitting, Min guard Grooming Details (indicate cue type and reason): Pt requiring Max A for standing balance  while at sink performing oral care. Pt with significant lean to Renee [name] and required cues for correction. Decreased coorindation and grasp strength at RUE as she managed tooth brush and tooth paste. Able to brush her hair while seated in recliner Upper Body Bathing: Minimal assistance, Sitting Lower Body Bathing: Moderate assistance, +2 for physical assistance, Sit to/from stand Upper Body Dressing : Minimal assistance, Sitting Lower Body Dressing: Moderate assistance, +2 for safety/equipment, Sit to/from stand Lower Body Dressing Details (indicate cue type and reason): Pt donning her left shoe with Mod A for postural control and sitting balance (lateral leaning to Renee [name]). Requriing assistance to bring Renee [name] ankle to knee and then Mod A for sitting balance when donning Renee [name] shoe.  Toilet Transfer: Moderate assistance, +2 for physical assistance, Ambulation, BSC, RW Toilet Transfer Details (indicate cue type and reason): Mod A for power up and to maintain balance during mobility. Cues for hand placement at [place] and Hygiene: Moderate assistance, +2 for physical assistance, Sit to/from stand Toileting - Clothing Manipulation Details (indicate cue type and reason): Pt able to perform peri care in standing with Mod A +2 for standing balance Functional mobility during ADLs: Moderate assistance, +2 for physical assistance, Rolling walker General ADL Comments: Pt presenting with decreased balance, strength, and coorindation at RUE/RLE.      Mobility   Overal bed mobility: Needs Assistance Bed Mobility: Sit to Supine Supine to sit: Min assist Sit to supine: Mod assist General bed mobility comments: Mod assistance to lift B feet back to bed against gravity.  Pt performed boosting to [facility] with B UEs and bed placed in trendelberg position.     Transfers   Overall transfer level: Needs assistance Equipment used: Rolling walker (2 wheeled) Transfers: Sit to/from Stand Sit to  Stand: Mod assist Stand pivot transfers: Max assist General transfer comment: MOd assistance to rise into standing.  Pt required assistance for R hand hand placement and assistance to adjust RLE to achieve standing.  Perfomed x 3 trials.  Pt limited due to R sided weakness and ataxia     Ambulation / Gait / Stairs / Wheelchair Mobility   Ambulation/Gait Ambulation/Gait assistance: Mod assist, +2 physical assistance(Max +1 for 6 ft and mod +2 for 8 ft) Gait Distance (Feet): 6 Feet(8 ft) Assistive device: Rolling walker (2 wheeled) Gait Pattern/deviations: Decreased stance time - Renee [name], Decreased dorsiflexion - Renee [name], Step-to pattern, Decreased weight shift to left General Gait Details: Pt performed 6 ft with poor foot clearance and dorsiflexion on her R side. She required assistance to weight shift L and advance R foot forward.  Gt distance limited due to need to have BM.  Once in sitting applied ace wrap to R side to achieve dorsiflexion. Gait velocity: decreasecd Gait velocity interpretation: <1.8 ft/sec, indicate of risk for recurrent falls     Posture / Balance Dynamic Sitting Balance Sitting balance - Comments: intermittent CGA-min assist needed at times to correct R lateral lean and return to midline, cues for awareness. Balance Overall balance assessment: Needs assistance Sitting-balance support: No upper extremity supported, Feet supported Sitting balance-Leahy Scale: Poor Sitting balance - Comments: intermittent CGA-min assist needed at times to correct R lateral lean and return to midline, cues for awareness. Postural  control: Renee [name] lateral lean Standing balance support: Bilateral upper extremity supported, During functional activity, No upper extremity supported Standing balance-Leahy Scale: Poor Standing balance comment: Reliant on physical A and UE support     Special needs/care consideration BiPAP/CPAP CPM Continuous Drip IV Dialysis         Life Vest Oxygen Special  Bed Trach Size Wound Vac  Skin ecchymosis to bilateral arms and Renee [name] hip Bowel mgmt: no LBM noted Bladder mgmt: external catheter Diabetic mgmt Behavioral consideration  Chemo/radiation  Designated visitor is daughter, [name]    Previous Home Environment  Living Arrangements: Children(son who is 50 years old has lived with her for 2 to 3 years)  Lives With: Other (Comment)(son lives with patient) Available Help at Discharge: Family, Available 24 hours/day(daughter and pt aware that 24/7 supervision will be needed) Type of Home: House Home Layout: Two level, Able to live on main level with bedroom/bathroom Home Access: Stairs to enter [employer] of Steps: 2 Bathroom Shower/Tub: Chiropodist: Handicapped height Bathroom Accessibility: Yes How Accessible: Accessible via walker [place]: Yes Type of Home Care Services: [place] (if known): ( private duty once a month) Has hired aide for last 3 weeks  Tuesday and Thursdays   Discharge Living Setting Plans for Discharge Living Setting: Patient's home, Other (Comment)(son, Aaron Edelman lives with pt for past 2 to 3 years) Type of Home at Discharge: House Discharge Home Layout: Two level, Able to live on main level with bedroom/bathroom Discharge Home Access: Stairs to enter Entrance Stairs-Number of Steps: 2 Discharge Bathroom Shower/Tub: Tub/shower unit Discharge Bathroom Toilet: Handicapped height Discharge Bathroom Accessibility: Yes How Accessible: Accessible via walker Does the patient have any problems obtaining your medications?: No   Social/Family/Support Systems Patient Roles: Parent Contact Information: daughter, [name] Anticipated Caregiver: Rosario Jacks, hired aide Anticipated Ambulance person Information: see above Ability/Limitations of Caregiver: Aaron Edelman works part time nights; [name] works days at [employer] cancer center Caregiver Availability:  24/7 Discharge Plan Discussed with Primary Caregiver: Yes Is Caregiver In Agreement with Plan?: Yes Does Caregiver/Family have Issues with Lodging/Transportation while Pt is in Rehab?: No   Goals/Additional Needs Patient/Family Goal for Rehab: supervision PT and OT Expected length of stay: ELOS 10 to 14 days Special Service Needs: will need outpatient f/u with oncology and for PET scans Pt/Family Agrees to Admission and willing to participate: Yes Program Orientation Provided & Reviewed with Pt/Caregiver Including Roles  & Responsibilities: Yes   Decrease burden of Care through IP rehab admission:   Possible need for SNF placement upon discharge:   Patient Condition: This patient's condition remains as documented in the consult dated 09/07/2019, in which the Rehabilitation Physician determined and documented that the patient's condition is appropriate for intensive rehabilitative care in an inpatient rehabilitation facility. Will admit to inpatient rehab today.   Preadmission Screen Completed By:  Cleatrice Burke, RN, 09/08/2019 2:37 PM ______________________________________________________________________   Discussed status with Dr. Dagoberto Ligas on 09/08/2019 at  1621 and received approval for admission today.   Admission Coordinator:  Cleatrice Burke, time [id number] Date 09/08/2019     Cosigned by: Courtney Heys, MD at 09/08/2019  4:25 PM  Revision History

## 2019-09-08 NOTE — Progress Notes (Addendum)
Inpatient Rehabilitation Admissions Coordinator  I have insurance approval and bed available to admit p today. I met with pt, daughter, contacted Dr. Louanne Belton and RN CM, Vida Roller. I will make the  arrangements to admit today.  Danne Baxter, RN, MSN Rehab Admissions Coordinator [phone] 09/08/2019 4:19 PM

## 2019-09-08 NOTE — Progress Notes (Addendum)
Physical Therapy Treatment Patient Details Name: Renee [name] MRN: [id number] DOB: May 19, 1946 Today's Date: 09/08/2019    History of Present Illness 74 yo female presenting to [facility] ED after a fall reporting her right leg gave out. CT at [facility] showing L parietal lobe hypodensity. 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 PMHx of stage I CLL, neuralgia, HTN, CKD 3 and thyroid disease    PT Comments    Pt performed gt training and functional mobility during session.  She required mod to max assistance to facilitate movement of her R side including LE and UE.  Provided ace wrap into dorsiflexion for improved advancement of R LE.  Continue to benefit from intensive therapies at CIR to improve function and strength before returning home.    Pt presents with Tight R heel cord and R PRAFO boot ordered to prevent increased tightness for carryover with gt training.       Follow Up Recommendations  CIR     Equipment Recommendations  Other (comment)(TBD at next venue)    Recommendations for Other Services Rehab consult     Precautions / Restrictions Precautions Precautions: Fall Precaution Comments: Significant weakness at RLE, impaired R UE proprioception Restrictions Weight Bearing Restrictions: No    Mobility  Bed Mobility Overal bed mobility: Needs Assistance Bed Mobility: Sit to Supine       Sit to supine: Mod assist   General bed mobility comments: Mod assistance to lift B feet back to bed against gravity.  Pt performed boosting to [facility] with B UEs and bed placed in trendelberg position.  Transfers Overall transfer level: Needs assistance Equipment used: Rolling walker (2 wheeled) Transfers: Sit to/from Stand Sit to Stand: Mod assist         General transfer comment: MOd assistance to rise into standing.  Pt required assistance for R hand hand placement and assistance to adjust RLE to achieve standing.  Perfomed x 3 trials.   Pt limited due to R sided weakness and ataxia  Ambulation/Gait Ambulation/Gait assistance: Mod assist;+2 physical assistance(Max +1 for 6 ft and mod +2 for 8 ft) Gait Distance (Feet): 6 Feet(8 ft) Assistive device: Rolling walker (2 wheeled) Gait Pattern/deviations: Decreased stance time - right;Decreased dorsiflexion - right;Step-to pattern;Decreased weight shift to left Gait velocity: decreasecd   General Gait Details: Pt performed 6 ft with poor foot clearance and dorsiflexion on her R side. She required assistance to weight shift L and advance R foot forward.  Gt distance limited due to need to have BM.  Once in sitting applied ace wrap to R side to achieve dorsiflexion.   Stairs             Wheelchair Mobility    Modified Rankin (Stroke Patients Only) Modified Rankin (Stroke Patients Only) Pre-Morbid Rankin Score: Moderately severe disability Modified Rankin: Moderately severe disability     Balance Overall balance assessment: Needs assistance Sitting-balance support: No upper extremity supported;Feet supported Sitting balance-Leahy Scale: Poor Sitting balance - Comments: intermittent CGA-min assist needed at times to correct R lateral lean and return to midline, cues for awareness. Postural control: Right lateral lean Standing balance support: Bilateral upper extremity supported;During functional activity;No upper extremity supported Standing balance-Leahy Scale: Poor Standing balance comment: Reliant on physical A and UE support                            Cognition Arousal/Alertness: Awake/alert Behavior During  Therapy: WFL for tasks assessed/performed Overall Cognitive Status: Impaired/Different from baseline Area of Impairment: Attention;Awareness                   Current Attention Level: Sustained       Awareness: Intellectual;Anticipatory   General Comments: pt with decreased attention to and awareness of R side during mobility, cues  for attention to R UE. Decreased awareness of R lateral lean at times, cues to correct      Exercises      General Comments        Pertinent Vitals/Pain Pain Assessment: No/denies pain Pain Location: R knee Pain Descriptors / Indicators: Constant;Discomfort;Grimacing Pain Intervention(s): Monitored during session;Repositioned    Home Living                      Prior Function            PT Goals (current goals can now be found in the care plan section) Acute Rehab PT Goals Patient Stated Goal: Get strong again Potential to Achieve Goals: Good Progress towards PT goals: Progressing toward goals    Frequency    Min 4X/week      PT Plan Current plan remains appropriate    Co-evaluation              AM-PAC PT "6 Clicks" Mobility   Outcome Measure  Help needed turning from your back to your side while in a flat bed without using bedrails?: A Little Help needed moving from lying on your back to sitting on the side of a flat bed without using bedrails?: A Little Help needed moving to and from a bed to a chair (including a wheelchair)?: A Lot Help needed standing up from a chair using your arms (e.g., wheelchair or bedside chair)?: A Lot Help needed to walk in hospital room?: A Lot Help needed climbing 3-5 steps with a railing? : Total 6 Click Score: 13    End of Session Equipment Utilized During Treatment: Gait belt Activity Tolerance: Patient tolerated treatment well Patient left: in chair;with call bell/phone within reach;with chair alarm set Nurse Communication: Mobility status PT Visit Diagnosis: Unsteadiness on feet (R26.81);Other abnormalities of gait and mobility (R26.89);History of falling (Z91.81);Difficulty in walking, not elsewhere classified (R26.2)     Time: [id number] PT Time Calculation (min) (ACUTE ONLY): 27 min  Charges:  $Gait Training: 8-22 mins $Therapeutic Activity: 8-22 mins                     Erasmo Leventhal , PTA Acute  Rehabilitation Services Pager [phone] Office [phone]     Kinze Labo Eli Hose 09/08/2019, 1:21 PM

## 2019-09-08 NOTE — Progress Notes (Signed)
PROGRESS NOTE  Renee [name] [id number] DOB: Dec 07, 1945 DOA: 09/03/2019 PCP: Ephriam Jenkins E   LOS: 5 days   Brief narrative: As per HPI, Renee [name] a 74 y.o.femalewith medical history significant ofstage I CLL, and a neuralgia followed at the [facility] pain clinic, hypertension, CKD 3, thyroid disease. Patient reports that several weeks ago she started to develop increasing paresthesia in her right foot. She does have a history of peripheral neuropathy to which she attributed her symptoms. Over the intervening period of timeshe had progressive paresthesia in the right leg and increasing weakness. She reports that she has had multiple falls due to poor balance.She has had no loss of vision. Had no severe headache. She has had no tremor. She had no fevers or chills. Because of her progressive weakness and falling she [facility] the [facility] for evaluation  ED Course:Patient is hemodynamically stable in the emergency department. Her evaluation did reveal some right-sided weakness and ataxia. CT scan of the head revealed a left parietal abnormality.MRI/MRA reveals 2 cm cystic lesion deep left parietal region with edema, no vascular occlusion.She is referred to [facility] for admission work-up forcystic brain lesion.  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 MRI showed 2 cm cystic lesion involving the subcortical white matter of the left parietal lobe demonstrates no intrinsic enhancement with  suspicion for low-grade glioma.   Neuro oncology Dr Mickeal Skinner was consulted and recommended contrast-enhanced MRI in 1 month.  Defer brain biopsy at this time.  Office will arrange a follow-up visit with her including imaging. On Keppra, Decadron.  Abnormal EEG.   Status post spinal tap.  No evidence of infection. Seen by physical therapy who recommend CIR.  Spoke with neurology Dr Aroor who did not feel that Keppra was necessary at this time.  Patient will however be notified about possibility of seizures and symptomatology on discharge.  Oncology recommends decreasing Decadron to 4 mg at discharge.  Will change Decadron to twice daily at this time.  Hyperglycemia while on steroids.  Continue sliding scale insulin.  Closely monitor blood glucose levels.  Last POC glucose was 93  Stage I CLL -Latest WBC at 80.9.  Check CBC in a.m. patient follows up with Dr. Delton Coombes at [facility]. Baseline WBC is around 80,000 per patient.  Watch closely on steroids.  Peripheral Neuropathy/Pudendal Neuralgia/Chronic Pain -Continue home pain regimen with methadone   History of Breast Cancer -Apparently in remission since 1999.  Hypothyroidism continue levothyroxine 100 mcg daily.  TSH of 2.0.  Depressive Disorder with Anxiety  -Stable. Continue Lurasidone, Duloxetine and Clonazepam   CKD III -Stable at this time, creatinine of 0.9  Hyperlipidemia -continue rosuvastatin   IBS/Constipation -Continue Lactulose Linaclotide, MiraLAX, Senna-Docusate   Normocytic Anemia -Latest hemoglobin of 10.0 today.  Will closely monitor  GERD continue pantoprazole   VTE Prophylaxis: SCDs- switch to lovenox.  No plan for surgical intervention  Code Status: DNR  Family Communication:  None today.    Disposition Plan: PT recommended CIR. Rehab has been consulted.  Patient is medically stable  for disposition to CIR if a bed is available.  Consultants:  Neurology,   neuro oncology  CIR  Procedures:   Lumbar puncture  Antibiotics:  Anti-infectives (From admission, onward)   None     Subjective: Today, patient denies any headache, dizziness, lightheadedness, nausea vomiting.  No shortness of breath.  Right lower extremity weakness persist  Objective: Vitals:   09/08/19 0412 09/08/19 0858  BP: (!) 100/58 99/74  Pulse: (!) 59 75  Resp: 16 18  Temp: 97.9 F (36.6 C) 98.6 F (37 C)  SpO2: 96% 98%    Intake/Output Summary (Last 24 hours) at 09/08/2019 1157 Last data filed at 09/08/2019 0901 Gross per 24 hour  Intake -  Output 1200 ml  Net -1200 ml   Filed Weights   09/03/19 1302  Weight: 83.5 kg   Body mass index is 24.28 kg/m.   Physical Exam: GENERAL: Patient is alert awake and oriented. Not in obvious distress. HENT: No scleral pallor or icterus. Pupils equally reactive to light. Oral mucosa is moist NECK: is supple, no palpable thyroid enlargement. CHEST: Clear to auscultation. No crackles or wheezes. Non tender on palpation. Diminished breath sounds bilaterally. CVS: S1 and S2 heard, no murmur. Regular rate and rhythm. No pericardial rub. ABDOMEN: Soft, non-tender, bowel sounds are present. EXTREMITIES: No edema. CNS: Cranial nerves are intact.  Right lower extremity weakness.  Mild right upper extremity handgrip weakness. SKIN: warm and dry without rashes.  Data Review: I have personally reviewed the following laboratory data and studies,  CBC: Recent Labs  Lab 09/03/19 1716 09/05/19 0757 09/06/19 0551  WBC 103.5* 74.1* 80.9*  NEUTROABS 5.3 6.6 7.4  HGB 11.2* 9.7* 10.0*  HCT 39.3 32.5* 33.7*  MCV 89.5 86.9 86.9  PLT 266 212 [id number]   Basic Metabolic Panel: Recent Labs  Lab 09/03/19 1716 09/05/19 0757 09/06/19 0551 09/08/19 0313  NA 140 140 140 139  K 4.0 4.5 4.0 4.6  CL 103 108 107 105  CO2 24 24 25 24   GLUCOSE 103* 131* 124* 133*  BUN 11 10 15 22   CREATININE 1.05* 1.04* 0.96 1.03*  CALCIUM 9.2 8.7* 9.0 8.9  MG  --  2.2 2.3 2.3  PHOS  --   4.2 5.1*  --    Liver Function Tests: Recent Labs  Lab 09/03/19 1716 09/05/19 0757 09/06/19 0551  AST 24 18 16   ALT 18 15 13   ALKPHOS 74 52 59  BILITOT 1.2 0.7 0.9  PROT 7.3 5.9* 5.9*  ALBUMIN 4.4 3.6 3.6   No results for input(s): LIPASE, AMYLASE in the last 168 hours. No results for input(s): AMMONIA in the last 168 hours. Cardiac Enzymes: No results for input(s): CKTOTAL, CKMB, CKMBINDEX, TROPONINI in the last 168 hours. BNP (last 3 results) No results for input(s): BNP in the last 8760 hours.  ProBNP (last 3 results) No results for input(s): PROBNP in the last 8760 hours.  CBG: Recent Labs  Lab 09/07/19 0618 09/07/19 1147 09/07/19 1618 09/07/19 2123 09/08/19 0604  GLUCAP 112* 104* 105* 157* 107*   Recent Results (from the past 240 hour(s))  SARS CORONAVIRUS 2 (TAT 6-24 HRS) Nasopharyngeal Nasopharyngeal Swab     Status: None   Collection Time: 09/03/19  9:04 PM   Specimen: Nasopharyngeal Swab  Result Value Ref Range Status   SARS Coronavirus 2 NEGATIVE NEGATIVE Final    Comment: (NOTE) SARS-CoV-2 target nucleic acids are NOT DETECTED. The SARS-CoV-2 RNA is generally detectable in upper and lower respiratory specimens during  the acute phase of infection. Negative results do not preclude SARS-CoV-2 infection, do not rule out co-infections with other pathogens, and should not be used as the sole basis for treatment or other patient management decisions. Negative results must be combined with clinical observations, patient history, and epidemiological information. The expected result is Negative. Fact Sheet for Patients: SugarRoll.be Fact Sheet for Healthcare Providers: https://www.woods-mathews.com/ This test is not yet approved or cleared by the [place] FDA and  has been authorized for detection and/or diagnosis of SARS-CoV-2 by FDA under an Emergency Use Authorization (EUA). This EUA will remain  in effect  (meaning this test can be used) for the duration of the COVID-19 declaration under Section 56 4(b)(1) of the Act, 21 U.S.C. section 360bbb-3(b)(1), unless the authorization is terminated or revoked sooner. Performed at [facility], [place] [address] [place], [place] [address]   CSF culture     Status: None (Preliminary result)   Collection Time: 09/06/19 11:00 AM   Specimen: PATH Cytology CSF; Cerebrospinal Fluid  Result Value Ref Range Status   Specimen Description CSF  Final   Special Requests NONE  Final   Gram Stain NO WBC SEEN NO ORGANISMS SEEN CYTOSPIN SMEAR   Final   Culture   Final    NO GROWTH 2 DAYS Performed at [facility], [place] [address] [place], [place] [address]    Report Status PENDING  Incomplete     Studies: No results found.  Scheduled Meds: . clonazePAM  0.5 mg Oral BID  . dexamethasone  4 mg Oral Q8H  . DULoxetine  60 mg Oral Daily  . feeding supplement (ENSURE ENLIVE)  237 mL Oral BID BM  . insulin aspart  0-5 Units Subcutaneous QHS  . insulin aspart  0-6 Units Subcutaneous TID WC  . lactulose  10 g Oral BID  . levETIRAcetam  250 mg Oral BID  . levothyroxine  100 mcg Oral QAC breakfast  . linaclotide  290 mcg Oral QAC breakfast  . lurasidone  40 mg Oral QHS  . methadone  15 mg Oral Daily   And  . methadone  10 mg Oral q1800  . pantoprazole  40 mg Oral BID  . polyethylene glycol  17 g Oral BID  . rosuvastatin  20 mg Oral q1800  . senna-docusate  1 tablet Oral BID    Continuous Infusions:   Flora Lipps, MD  [facility] 09/08/2019

## 2019-09-09 ENCOUNTER — Inpatient Hospital Stay (HOSPITAL_COMMUNITY): Payer: Medicare Other

## 2019-09-09 ENCOUNTER — Inpatient Hospital Stay (HOSPITAL_COMMUNITY): Payer: Medicare Other | Admitting: Physical Therapy

## 2019-09-09 ENCOUNTER — Inpatient Hospital Stay (HOSPITAL_COMMUNITY)
Admission: RE | Admit: 2019-09-09 | Discharge: 2019-09-30 | DRG: 057 | Disposition: A | Discharge status: Home Health | Payer: Medicare Other | Source: Intra-hospital | Attending: Physical Medicine & Rehabilitation | Admitting: Physical Medicine & Rehabilitation

## 2019-09-09 ENCOUNTER — Other Ambulatory Visit: Payer: Self-pay | Admitting: Radiation Therapy

## 2019-09-09 DIAGNOSIS — D509 Iron deficiency anemia, unspecified: Secondary | ICD-10-CM

## 2019-09-09 DIAGNOSIS — I129 Hypertensive chronic kidney disease with stage 1 through stage 4 chronic kidney disease, or unspecified chronic kidney disease: Secondary | ICD-10-CM | POA: Diagnosis present

## 2019-09-09 DIAGNOSIS — Z515 Encounter for palliative care: Secondary | ICD-10-CM | POA: Diagnosis not present

## 2019-09-09 DIAGNOSIS — R531 Weakness: Secondary | ICD-10-CM

## 2019-09-09 DIAGNOSIS — Z79890 Hormone replacement therapy (postmenopausal): Secondary | ICD-10-CM | POA: Diagnosis not present

## 2019-09-09 DIAGNOSIS — G4700 Insomnia, unspecified: Secondary | ICD-10-CM | POA: Diagnosis present

## 2019-09-09 DIAGNOSIS — C9110 Chronic lymphocytic leukemia of B-cell type not having achieved remission: Secondary | ICD-10-CM | POA: Diagnosis present

## 2019-09-09 DIAGNOSIS — N319 Neuromuscular dysfunction of bladder, unspecified: Secondary | ICD-10-CM | POA: Diagnosis not present

## 2019-09-09 DIAGNOSIS — R591 Generalized enlarged lymph nodes: Secondary | ICD-10-CM | POA: Diagnosis present

## 2019-09-09 DIAGNOSIS — A812 Progressive multifocal leukoencephalopathy: Secondary | ICD-10-CM

## 2019-09-09 DIAGNOSIS — M25552 Pain in left hip: Secondary | ICD-10-CM | POA: Diagnosis not present

## 2019-09-09 DIAGNOSIS — Z888 Allergy status to other drugs, medicaments and biological substances status: Secondary | ICD-10-CM | POA: Diagnosis not present

## 2019-09-09 DIAGNOSIS — G5621 Lesion of ulnar nerve, right upper limb: Secondary | ICD-10-CM | POA: Diagnosis present

## 2019-09-09 DIAGNOSIS — K6289 Other specified diseases of anus and rectum: Secondary | ICD-10-CM | POA: Diagnosis present

## 2019-09-09 DIAGNOSIS — M6281 Muscle weakness (generalized): Secondary | ICD-10-CM | POA: Diagnosis present

## 2019-09-09 DIAGNOSIS — G9389 Other specified disorders of brain: Secondary | ICD-10-CM | POA: Diagnosis not present

## 2019-09-09 DIAGNOSIS — R5381 Other malaise: Secondary | ICD-10-CM | POA: Diagnosis present

## 2019-09-09 DIAGNOSIS — K5903 Drug induced constipation: Secondary | ICD-10-CM

## 2019-09-09 DIAGNOSIS — T380X5A Adverse effect of glucocorticoids and synthetic analogues, initial encounter: Secondary | ICD-10-CM | POA: Diagnosis present

## 2019-09-09 DIAGNOSIS — Z800 Family history of malignant neoplasm of digestive organs: Secondary | ICD-10-CM

## 2019-09-09 DIAGNOSIS — R52 Pain, unspecified: Secondary | ICD-10-CM

## 2019-09-09 DIAGNOSIS — E039 Hypothyroidism, unspecified: Secondary | ICD-10-CM | POA: Diagnosis present

## 2019-09-09 DIAGNOSIS — C719 Malignant neoplasm of brain, unspecified: Secondary | ICD-10-CM | POA: Insufficient documentation

## 2019-09-09 DIAGNOSIS — Z9221 Personal history of antineoplastic chemotherapy: Secondary | ICD-10-CM | POA: Diagnosis not present

## 2019-09-09 DIAGNOSIS — R4189 Other symptoms and signs involving cognitive functions and awareness: Secondary | ICD-10-CM | POA: Diagnosis not present

## 2019-09-09 DIAGNOSIS — Z87891 Personal history of nicotine dependence: Secondary | ICD-10-CM

## 2019-09-09 DIAGNOSIS — F329 Major depressive disorder, single episode, unspecified: Secondary | ICD-10-CM | POA: Diagnosis present

## 2019-09-09 DIAGNOSIS — M898X1 Other specified disorders of bone, shoulder: Secondary | ICD-10-CM

## 2019-09-09 DIAGNOSIS — M25511 Pain in right shoulder: Secondary | ICD-10-CM | POA: Diagnosis present

## 2019-09-09 DIAGNOSIS — D72820 Lymphocytosis (symptomatic): Secondary | ICD-10-CM | POA: Diagnosis not present

## 2019-09-09 DIAGNOSIS — I959 Hypotension, unspecified: Secondary | ICD-10-CM

## 2019-09-09 DIAGNOSIS — G8110 Spastic hemiplegia affecting unspecified side: Secondary | ICD-10-CM | POA: Diagnosis not present

## 2019-09-09 DIAGNOSIS — Z923 Personal history of irradiation: Secondary | ICD-10-CM | POA: Diagnosis not present

## 2019-09-09 DIAGNOSIS — Z885 Allergy status to narcotic agent status: Secondary | ICD-10-CM | POA: Diagnosis not present

## 2019-09-09 DIAGNOSIS — G3184 Mild cognitive impairment, so stated: Secondary | ICD-10-CM | POA: Diagnosis present

## 2019-09-09 DIAGNOSIS — R296 Repeated falls: Secondary | ICD-10-CM | POA: Diagnosis present

## 2019-09-09 DIAGNOSIS — N3289 Other specified disorders of bladder: Secondary | ICD-10-CM | POA: Diagnosis present

## 2019-09-09 DIAGNOSIS — R739 Hyperglycemia, unspecified: Secondary | ICD-10-CM | POA: Diagnosis present

## 2019-09-09 DIAGNOSIS — Z853 Personal history of malignant neoplasm of breast: Secondary | ICD-10-CM | POA: Diagnosis not present

## 2019-09-09 DIAGNOSIS — G8929 Other chronic pain: Secondary | ICD-10-CM | POA: Diagnosis present

## 2019-09-09 DIAGNOSIS — Z7189 Other specified counseling: Secondary | ICD-10-CM | POA: Diagnosis not present

## 2019-09-09 DIAGNOSIS — R270 Ataxia, unspecified: Secondary | ICD-10-CM | POA: Diagnosis present

## 2019-09-09 DIAGNOSIS — R9431 Abnormal electrocardiogram [ECG] [EKG]: Secondary | ICD-10-CM

## 2019-09-09 DIAGNOSIS — K117 Disturbances of salivary secretion: Secondary | ICD-10-CM | POA: Diagnosis present

## 2019-09-09 DIAGNOSIS — R682 Dry mouth, unspecified: Secondary | ICD-10-CM | POA: Diagnosis present

## 2019-09-09 DIAGNOSIS — M792 Neuralgia and neuritis, unspecified: Secondary | ICD-10-CM

## 2019-09-09 DIAGNOSIS — E440 Moderate protein-calorie malnutrition: Secondary | ICD-10-CM | POA: Diagnosis present

## 2019-09-09 DIAGNOSIS — N182 Chronic kidney disease, stage 2 (mild): Secondary | ICD-10-CM | POA: Diagnosis present

## 2019-09-09 DIAGNOSIS — R21 Rash and other nonspecific skin eruption: Secondary | ICD-10-CM | POA: Diagnosis not present

## 2019-09-09 DIAGNOSIS — M21371 Foot drop, right foot: Secondary | ICD-10-CM | POA: Diagnosis present

## 2019-09-09 DIAGNOSIS — D508 Other iron deficiency anemias: Secondary | ICD-10-CM | POA: Diagnosis not present

## 2019-09-09 DIAGNOSIS — G8111 Spastic hemiplegia affecting right dominant side: Secondary | ICD-10-CM | POA: Diagnosis present

## 2019-09-09 DIAGNOSIS — Z66 Do not resuscitate: Secondary | ICD-10-CM | POA: Diagnosis present

## 2019-09-09 DIAGNOSIS — Z6822 Body mass index (BMI) 22.0-22.9, adult: Secondary | ICD-10-CM | POA: Diagnosis not present

## 2019-09-09 DIAGNOSIS — C9210 Chronic myeloid leukemia, BCR/ABL-positive, not having achieved remission: Secondary | ICD-10-CM | POA: Diagnosis not present

## 2019-09-09 DIAGNOSIS — K589 Irritable bowel syndrome without diarrhea: Secondary | ICD-10-CM | POA: Diagnosis present

## 2019-09-09 DIAGNOSIS — F411 Generalized anxiety disorder: Secondary | ICD-10-CM

## 2019-09-09 DIAGNOSIS — F419 Anxiety disorder, unspecified: Secondary | ICD-10-CM | POA: Diagnosis present

## 2019-09-09 DIAGNOSIS — N1831 Chronic kidney disease, stage 3a: Secondary | ICD-10-CM

## 2019-09-09 LAB — GLUCOSE, CAPILLARY
Glucose-Capillary: 99 mg/dL (ref 70–99)
Glucose-Capillary: 104 mg/dL — ABNORMAL HIGH (ref 70–99)
Glucose-Capillary: 104 mg/dL — ABNORMAL HIGH (ref 70–99)
Glucose-Capillary: 128 mg/dL — ABNORMAL HIGH (ref 70–99)

## 2019-09-09 LAB — CSF CULTURE W GRAM STAIN
Culture: NO GROWTH
Gram Stain: NONE SEEN

## 2019-09-09 LAB — OLIGOCLONAL BANDS, CSF + SERM

## 2019-09-09 MED ORDER — ALUM & MAG HYDROXIDE-SIMETH 200-200-20 MG/5ML PO SUSP: 30.0000 mL | ORAL | Status: DC | PRN

## 2019-09-09 MED ORDER — BISACODYL 10 MG RE SUPP
10.0000 mg | Freq: Every day | RECTAL | Status: DC | PRN
  Administered 2019-09-27: 08:00:00 10 mg via RECTAL
  Filled 2019-09-09 (×2): qty 1

## 2019-09-09 MED ORDER — FLEET ENEMA 7-19 GM/118ML RE ENEM: 1.0000 | ENEMA | Freq: Once | RECTAL | Status: DC | PRN

## 2019-09-09 MED ORDER — DEXAMETHASONE 4 MG PO TABS
4.0000 mg | ORAL_TABLET | Freq: Two times a day (BID) | ORAL | Status: DC
  Administered 2019-09-09 – 2019-09-24 (×31): 4 mg via ORAL
  Filled 2019-09-09 (×31): qty 1

## 2019-09-09 MED ORDER — ROSUVASTATIN CALCIUM 20 MG PO TABS
20.0000 mg | ORAL_TABLET | Freq: Every day | ORAL | Status: DC
  Administered 2019-09-09 – 2019-09-29 (×21): 20 mg via ORAL
  Filled 2019-09-09 (×21): qty 1

## 2019-09-09 MED ORDER — LURASIDONE HCL 40 MG PO TABS
40.0000 mg | ORAL_TABLET | Freq: Every day | ORAL | Status: DC
  Administered 2019-09-09 – 2019-09-29 (×22): 40 mg via ORAL
  Filled 2019-09-09 (×23): qty 1

## 2019-09-09 MED ORDER — ENSURE ENLIVE PO LIQD
237.0000 mL | Freq: Three times a day (TID) | ORAL | Status: DC
  Administered 2019-09-09 – 2019-09-30 (×59): 237 mL via ORAL

## 2019-09-09 MED ORDER — INSULIN ASPART 100 UNIT/ML ~~LOC~~ SOLN: 0.0000 [IU] | Freq: Every day | SUBCUTANEOUS | Status: DC

## 2019-09-09 MED ORDER — SENNOSIDES-DOCUSATE SODIUM 8.6-50 MG PO TABS
2.0000 | ORAL_TABLET | Freq: Every day | ORAL | Status: DC
  Administered 2019-09-09 – 2019-09-15 (×8): 2 via ORAL
  Filled 2019-09-09 (×7): qty 2

## 2019-09-09 MED ORDER — LACTULOSE 10 GM/15ML PO SOLN
10.0000 g | Freq: Two times a day (BID) | ORAL | Status: DC
  Administered 2019-09-09 – 2019-09-25 (×25): 10 g via ORAL
  Filled 2019-09-09 (×33): qty 15

## 2019-09-09 MED ORDER — GUAIFENESIN-DM 100-10 MG/5ML PO SYRP: 5.0000 mL | ORAL_SOLUTION | Freq: Four times a day (QID) | ORAL | Status: DC | PRN

## 2019-09-09 MED ORDER — CLONAZEPAM 0.5 MG PO TABS
0.5000 mg | ORAL_TABLET | Freq: Two times a day (BID) | ORAL | Status: DC
  Administered 2019-09-09 – 2019-09-30 (×43): 0.5 mg via ORAL
  Filled 2019-09-09 (×43): qty 1

## 2019-09-09 MED ORDER — PROCHLORPERAZINE 25 MG RE SUPP: 12.5000 mg | Freq: Four times a day (QID) | RECTAL | Status: DC | PRN

## 2019-09-09 MED ORDER — PANTOPRAZOLE SODIUM 40 MG PO TBEC
40.0000 mg | DELAYED_RELEASE_TABLET | Freq: Two times a day (BID) | ORAL | Status: DC
  Administered 2019-09-09 – 2019-09-30 (×43): 40 mg via ORAL
  Filled 2019-09-09 (×42): qty 1

## 2019-09-09 MED ORDER — POLYETHYLENE GLYCOL 3350 17 G PO PACK
17.0000 g | PACK | Freq: Two times a day (BID) | ORAL | Status: DC
  Administered 2019-09-09 – 2019-09-13 (×6): 17 g via ORAL
  Filled 2019-09-09 (×9): qty 1

## 2019-09-09 MED ORDER — ENOXAPARIN SODIUM 40 MG/0.4ML ~~LOC~~ SOLN: 40.0000 mg | SUBCUTANEOUS | Status: DC

## 2019-09-09 MED ORDER — ACETAMINOPHEN 325 MG PO TABS
325.0000 mg | ORAL_TABLET | ORAL | Status: DC | PRN
  Administered 2019-09-09 – 2019-09-14 (×15): 650 mg via ORAL
  Administered 2019-09-14: 325 mg via ORAL
  Administered 2019-09-14 – 2019-09-21 (×12): 650 mg via ORAL
  Filled 2019-09-09 (×28): qty 2

## 2019-09-09 MED ORDER — METHADONE HCL 10 MG PO TABS
10.0000 mg | ORAL_TABLET | Freq: Every day | ORAL | Status: DC
  Administered 2019-09-09 – 2019-09-13 (×5): 10 mg via ORAL
  Filled 2019-09-09 (×5): qty 1

## 2019-09-09 MED ORDER — DIPHENHYDRAMINE HCL 12.5 MG/5ML PO ELIX: 12.5000 mg | ORAL_SOLUTION | Freq: Four times a day (QID) | ORAL | Status: DC | PRN

## 2019-09-09 MED ORDER — PROCHLORPERAZINE MALEATE 5 MG PO TABS: 5.0000 mg | ORAL_TABLET | Freq: Four times a day (QID) | ORAL | Status: DC | PRN

## 2019-09-09 MED ORDER — LINACLOTIDE 145 MCG PO CAPS
290.0000 ug | ORAL_CAPSULE | Freq: Every day | ORAL | Status: DC
  Administered 2019-09-09 – 2019-09-16 (×8): 290 ug via ORAL
  Filled 2019-09-09 (×8): qty 2

## 2019-09-09 MED ORDER — INSULIN ASPART 100 UNIT/ML ~~LOC~~ SOLN: 0.0000 [IU] | Freq: Three times a day (TID) | SUBCUTANEOUS | Status: DC

## 2019-09-09 MED ORDER — PROCHLORPERAZINE EDISYLATE 10 MG/2ML IJ SOLN: 5.0000 mg | Freq: Four times a day (QID) | INTRAMUSCULAR | Status: DC | PRN

## 2019-09-09 MED ORDER — POLYETHYLENE GLYCOL 3350 17 G PO PACK
17.0000 g | PACK | Freq: Every day | ORAL | Status: DC | PRN
  Administered 2019-09-15: 17 g via ORAL
  Filled 2019-09-09: qty 1

## 2019-09-09 MED ORDER — LEVOTHYROXINE SODIUM 100 MCG PO TABS
100.0000 ug | ORAL_TABLET | Freq: Every day | ORAL | Status: DC
  Administered 2019-09-09 – 2019-09-30 (×22): 100 ug via ORAL
  Filled 2019-09-09 (×22): qty 1

## 2019-09-09 MED ORDER — ENOXAPARIN SODIUM 40 MG/0.4ML ~~LOC~~ SOLN
40.0000 mg | SUBCUTANEOUS | Status: DC
  Administered 2019-09-09 – 2019-09-27 (×19): 40 mg via SUBCUTANEOUS
  Filled 2019-09-09 (×18): qty 0.4

## 2019-09-09 MED ORDER — METHADONE HCL 5 MG PO TABS
15.0000 mg | ORAL_TABLET | Freq: Every day | ORAL | Status: DC
  Administered 2019-09-09 – 2019-09-14 (×6): 15 mg via ORAL
  Filled 2019-09-09 (×6): qty 3

## 2019-09-09 NOTE — Progress Notes (Signed)
Initial Nutrition Assessment  DOCUMENTATION CODES:   Non-severe (moderate) malnutrition in context of chronic illness  INTERVENTION:   - Continue Ensure Enlive po TID, each supplement provides 350 kcal and 20 grams of protein  - Magic cup BID with lunch and dinner meals, each supplement provides 290 kcal and 9 grams of protein  - MVI with minerals daily  - Provided menu orientation  - Encourage adequate PO intake  NUTRITION DIAGNOSIS:   Moderate Malnutrition related to chronic illness as evidenced by mild fat depletion, mild muscle depletion, moderate muscle depletion.  GOAL:   Patient will meet greater than or equal to 90% of their needs  MONITOR:   PO intake, Supplement acceptance, Labs, Weight trends  REASON FOR ASSESSMENT:   Malnutrition Screening Tool    ASSESSMENT:   74 year old female with PMH of CKD, HTN, CLL, breast cancer s/p lumpectomy with XRT, pudendal neuralgia, anxiety disorder. Pt was admitted on 09/04/19 with history of multiple falls with progressive weakness. MRI brain done showing 2 cm cystic lesion with vasogenic edema and question of tumor versus cerebritis. Pt was started on Decadron and MRI brain with contrast showed 2 cm subcortical white matter lesion in parietal lobe without enhancement. Pt admitted to CIR on 1/05.   Spoke with pt at bedside. Pt reports that she began having a decrease in appetite about 6 months ago and that her appetite has gotten progressively worse. Pt reports she could go a whole day without eating because she just doesn't feel hungry.  Pt reports that she is having trouble finding foods that she wants to eat. RD provided a menu orientation and discussed that pt can have family members bring in food from home or from restaurants. Pt states she really likes cereal bars in the AM. Encouraged pt to keep a box of cereal bars in her room along with other snack items that sound appealing to her.  Pt reports that for breakfast this  AM, she had one piece of [place] toast. Pt's lunch tray came at time of RD visit (peaches, chicken salad sandwich, tea). RD assisted pt in getting set up for lunch.  Pt reports she has been drinking 2 Ensure Enlive daily but has not yet received a supplement. Pt willing to try Magic Cups with lunch and dinner meals.  Pt endorses weight loss over the last 6 months. Pt reports her UBW as 295 lbs and states that she now weighs 287 lbs.  Reviewed weight history in chart. Pt with a 3.6 kg weight loss since 07/08/19. This is a 4.2% weight loss in 2 months which is not quite significant for timeframe but is concerning.  Meal Completion: 60%  Medications reviewed and include: decadron, Ensure Enlive TID, SSI, lactulose, protonix, Miralax, senna  Labs reviewed. CBG's: 93-110 x 24 hours  NUTRITION - FOCUSED PHYSICAL EXAM:    Most Recent Value  Orbital Region  Mild depletion  Upper Arm Region  Mild depletion  Thoracic and Lumbar Region  No depletion  Buccal Region  No depletion  Temple Region  Moderate depletion  Clavicle Bone Region  Moderate depletion  Clavicle and Acromion Bone Region  Moderate depletion  Scapular Bone Region  Unable to assess  Dorsal Hand  Mild depletion  Patellar Region  Moderate depletion  Anterior Thigh Region  Moderate depletion  Posterior Calf Region  Moderate depletion  Edema (RD Assessment)  None  Hair  Reviewed  Eyes  Reviewed  Mouth  Reviewed  Skin  Reviewed  Nails  Reviewed       Diet Order:   Diet Order            Diet regular Room service appropriate? Yes; Fluid consistency: Thin  Diet effective now              EDUCATION NEEDS:   Education needs have been addressed  Skin:  Skin Assessment: Reviewed RN Assessment  Last BM:  09/08/19  Height:   Ht Readings from Last 1 Encounters:  09/03/19 6\' 1"  (1.854 m)    Weight:   Wt Readings from Last 1 Encounters:  09/09/19 82.6 kg    Ideal Body Weight:  75 kg  BMI:  Body mass index is  24.03 kg/m.  Estimated Nutritional Needs:   Kcal:  1900-2100  Protein:  95-110 grams  Fluid:  >/= 1.9 L    Gaynell Face, MS, RD, LDN Inpatient Clinical Dietitian Pager: [phone] Weekend/After Hours: [phone]

## 2019-09-09 NOTE — Progress Notes (Signed)
Physical Therapy Session Note  Patient Details  Name: Renee [name] MRN: [id number] Date of Birth: September 27, 1945  Today's Date: 09/09/2019 PT Individual Time:1105  - [id number], 60 min     Short Term Goals: Week 1:     Skilled Therapeutic Interventions/Progress Updates:  Pt resting in recliner.  She rated pain at rectum 5/10 , medicated.  Sit> stand with max assist and max cues for forward wt shift.  Max assist to manage RLE and pivot to wc on L, with bil hands on back of armchair in front of her.  W/c propulsion with mod assist for steering x 25', using bil UEs.   PT wrapped R wheel rim with Theraband.    neuromuscular re-education via mirror feedback and multimodal cues for midline orientation in sitting, moving midline >< L and trunk flexion>< neutral trunk.  Tactile cues needed to activate L trunk to correct R lean, througout movements. W/c propulsion using bil UEs iwht R rim wrapped, x 15' x 2 with min assist for steering.  W/c propulsion using hemi method with min assist fading to suplervision, with cones on L for visual cues.  Pt exhausted and tired of sitting on her bottom.  She asked to get back to bed.  Stand pivot to L, using upper bed rail, max assist to manage RLE.  Sit> supine with mod asisst for bil LEs.  At end of session, pt resting with bed alarm set and [name], NT attending her.     Therapy Documentation Precautions:  Restrictions Weight Bearing Restrictions: No  :      Therapy/Group: Individual Therapy  Fawne Hughley 09/09/2019, 9:57 AM

## 2019-09-09 NOTE — Progress Notes (Signed)
[place] PHYSICAL MEDICINE & REHABILITATION PROGRESS NOTE  Subjective/Complaints: Patient seen laying in bed this AM.  She states she slept fairly overnight due to very late arrival to rehab.  ROS: Denies CP, SOB, N/V/D  Objective: Vital Signs: Blood pressure (!) 107/59, pulse 71, temperature 98.2 F (36.8 C), temperature source Oral, resp. rate 18, weight 82.6 kg, SpO2 96 %. No results found. No results for input(s): WBC, HGB, HCT, PLT in the last 72 hours. Recent Labs    09/08/19 0313  NA 139  K 4.6  CL 105  CO2 24  GLUCOSE 133*  BUN 22  CREATININE 1.03*  CALCIUM 8.9    Physical Exam: BP (!) 107/59 (BP Location: Right Arm)   Pulse 71   Temp 98.2 F (36.8 C) (Oral)   Resp 18   Wt 82.6 kg   SpO2 96%   BMI 24.03 kg/m  Constitutional: No distress . Vital signs reviewed. HENT: Normocephalic.  Atraumatic. Eyes: EOMI. No discharge. Cardiovascular: No JVD. Respiratory: Normal effort.  No stridor. GI: Non-distended. Skin: Warm and dry.  Intact. Psych: Normal mood.  Normal behavior. Musc: No edema in extremities.  No tenderness in extremities. Neuro: Alert Motor: LUE/LLE: 5/5 proximal to distal RUE: 4-4+/5 proximal to distal RLE: HF, KE 3+/5, ADF 0/5  Assessment/Plan: 1. 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  Physiatrist is providing close team supervision and 24 hour management of active medical problems listed below.  Physiatrist and rehab team continue to assess barriers to discharge/monitor patient progress toward functional and medical goals  Care Tool:  Bathing              Bathing assist       Upper Body Dressing/Undressing Upper body dressing        Upper body assist      Lower Body Dressing/Undressing Lower body dressing      What is the patient wearing?: Incontinence brief     Lower body assist Assist for  lower body dressing: Moderate Assistance - Patient 50 - 74%     Toileting Toileting    Toileting assist       Transfers Chair/bed transfer  Transfers assist           Locomotion Ambulation   Ambulation assist              Walk 10 feet activity   Assist           Walk 50 feet activity   Assist           Walk 150 feet activity   Assist           Walk 10 feet on uneven surface  activity   Assist           Wheelchair     Assist               Wheelchair 50 feet with 2 turns activity    Assist            Wheelchair 150 feet activity     Assist            Medical Problem List and Plan:  1. 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  2. Antithrombotics:   -DVT/anticoagulation: Pharmaceutical: Lovenox   -antiplatelet therapy: n/a  3. Chronic pudendal pain/Pain Management: cymbalta with Continue methadone bid   Monitor with increased mobility  ECG 12/31 reviewed - borderline prolonged Qtc, will order repeat 4. Mood: LCSW to follow for evaluation and support.   -antipsychotic agents: N/A  5. Neuropsych: This patient is ? fully capable of making decisions on her own behalf.  6. Skin/Wound Care: Routine pressure relief measures.  7. 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 Improved.   Cr 1.03 on 1/5  Cont to monitor 9. Hypothyroid: Resume supplement.  10. 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  Recommendations to reduce decadron to 4 mg at discharge.   On Keppra and decadron-->tapered to bid on 1/5  12. Steroid induced hyperglycemia: Hgb  A1c-5.4.   Monitor with increased mobility 13. OIC: On multiple medications--Laculose, Linzess, Miralax and senna  Increase bowel meds as necessary 14. Breast cancer in remission/CLL being observed:   WBCs 80.9 on 1/3  Afebrile  Cont to monitor 14. Iron deficiently anemia: Added iron supplement   Hb 10.0 on 1/3  Cont to monitor 15. Spasticity/contracture formation of R ankle  Will consider Baclofen   PRAFO  LOS: 0 days A FACE TO FACE EVALUATION WAS PERFORMED  [name] 09/09/2019, 8:41 AM

## 2019-09-09 NOTE — Evaluation (Signed)
Physical Therapy Assessment and Plan  Patient Details  Name: Renee [name] MRN: [id number] Date of Birth: [birth year]  PT Diagnosis: Abnormal posture, Abnormality of gait, Ataxia, Ataxic gait, Cognitive deficits, Coordination disorder, Difficulty walking, Dizziness and giddiness and Hemiplegia dominant Rehab Potential: Good ELOS: 21-25 days   Today's Date: 09/09/2019 PT Individual Time: 0848-1000 PT Individual Time Calculation (min): 72 min    Problem List:  Patient Active Problem List   Diagnosis Date Noted  . Mass of parietal lobe 09/09/2019  . Spastic hemiparesis ([place])   . Iron deficiency anemia   . Drug induced constipation   . Steroid-induced hyperglycemia   . Prolonged Q-T interval on ECG   . 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 Right sided weakness 09/03/2019  . Constipation 07/08/2019  . GERD (gastroesophageal reflux disease) 07/08/2019  . History of breast cancer 12/27/2017  . Chronic renal disease, stage 3, moderately decreased glomerular filtration rate between 30-59 mL/min/1.73 square meter 12/27/2017  . CLL (chronic lymphocytic leukemia) ([place]) 02/21/2017  . Chronic depression 05/14/2015  . Rectal pain, chronic 02/10/2015  . Chronic pain 12/28/2013  . 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 Hypothyroidism 01/07/2013  . Vitamin D deficiency 01/07/2013    Past Medical History:  Past Medical History:  Diagnosis Date  . Chronic renal disease, stage 3, moderately decreased glomerular filtration rate between 30-59 mL/min/1.73 square meter 12/27/2017  . Constipation   . Depression   . History of breast cancer 12/27/2017  . Hypertension   . Leukemia ([place])    CLL  . PVD (peripheral vascular disease) ([place])   . Thyroid disease    Past Surgical History:  Past Surgical History:  Procedure Laterality Date  . ABDOMINAL HYSTERECTOMY    . BREAST SURGERY     Left Breast Lumpectomy  . COLON SURGERY    .  HERNIA REPAIR  2000   Hiatal hernia repair      Assessment & Plan Clinical Impression: Patient is a 74 year old female with history of CKD, HTN, CLL, breast cancer s/p lumpectomy with XRT, pudendal neuralgia, anxiety disorder who was admitted on 09/04/19 with 2 to 3-week history of multiple falls with progressive weakness affecting FMC of right hand and right foot drop. MRI brain done showing 2 cm cystic lesion with vasogenic edema and question of tumor versus cerebritis. She was transferred to [facility] for further work-up and evaluated by neurology. She was started on Decadron and MRI brain with contrast showed 2 cm subcortical white matter lesion in parietal lobe without enhancement--cerebritis or subacute infarct felt to be unlikely. LP done showing elevated glucose with 1 WBC and 89 RBC. CSF sent for flow cytometry and JC virus. Dr. Malen Gauze recommends biopsy with PET scan and Dr. Mickeal Skinner consulted for input and felt that CNS involvement and CLL was very uncommon. He question nonneoplastic etiology such as vascular inflammatory process and recommend repeating contrast enhanced MRI in 1 month as well as decreasing Decadron to 4 mg daily at discharge. Patient to be discussed at tumor board on 1/11. Aggressive rehab recommended due to ongoing right-sided weakness affecting mobility and ADLs.  Patient transferred to CIR on 09/09/2019 .   Patient currently requires max with mobility secondary to muscle weakness, muscle joint tightness and muscle paralysis, decreased cardiorespiratoy endurance, abnormal tone, motor apraxia, ataxia, decreased coordination and decreased motor planning, decreased midline orientation and decreased attention to right, central origin and decreased sitting balance, decreased  standing balance, decreased postural control, hemiplegia and decreased balance strategies.  Prior to hospitalization, patient was modified independent  with mobility and lived with Other (Comment), Son in a House home.   Home access is 2+2Stairs to enter, Ramped entrance.  Patient will benefit from skilled PT intervention to maximize safe functional mobility, minimize fall risk and decrease caregiver burden for planned discharge home with 24 hour assist.  Anticipate patient will benefit from follow up [facility] at discharge.  PT - End of Session Activity Tolerance: Tolerates < 10 min activity, no significant change in vital signs Endurance Deficit: Yes Endurance Deficit Description: generalized weakness PT Assessment Rehab Potential (ACUTE/IP ONLY): Good PT Barriers to Discharge: Inaccessible home environment;Decreased caregiver support;Home environment access/layout PT Patient demonstrates impairments in the following area(s): Balance;Behavior;Endurance;Motor;Perception;Safety;Sensory;Skin Integrity PT Transfers Functional Problem(s): Bed Mobility;Bed to Chair;Car;Furniture;Floor PT Locomotion Functional Problem(s): Ambulation;Wheelchair Mobility PT Plan PT Intensity: Minimum of 1-2 x/day ,45 to 90 minutes PT Frequency: 5 out of 7 days PT Duration Estimated Length of Stay: 21-25 days PT Treatment/Interventions: Ambulation/gait training;Discharge planning;Functional mobility training;Psychosocial support;Visual/perceptual remediation/compensation;Therapeutic Activities;Balance/vestibular training;Disease management/prevention;Neuromuscular re-education;Skin care/wound management;Therapeutic Exercise;Wheelchair propulsion/positioning;UE/LE Strength taining/ROM;Splinting/orthotics;Pain management;DME/adaptive equipment instruction;Cognitive remediation/compensation;Community reintegration;Patient/family education;Stair training;UE/LE Coordination activities;Functional electrical stimulation PT Transfers Anticipated Outcome(s): min assist with LRAD to and form WC PT Locomotion Anticipated Outcome(s): Min assist ambulatory for short distances. supevision assist WC mobility PT Recommendation Recommendations for Other  Services: Therapeutic Recreation consult Therapeutic Recreation Interventions: Stress management;Outing/community reintergration Follow Up Recommendations: Home health PT Patient destination: Home Equipment Recommended: To be determined  Skilled Therapeutic Intervention Pt received supine in bed and agreeable to PT. Supine>sit transfer with max assist assist and max cues for posture and midline orientation. PT instructed patient in PT Evaluation and initiated treatment intervention; see below for results. PT educated patient in [place], rehab potential, rehab goals, and discharge recommendations. Pt performed squat pivot transfer and sit<>stand form WC with max assist as listed below.   Pt  Then reports need for BM. Patient returned to room and performed stand pivot transfer to toilet with mod assist and heavy use of rail in bathroom.  Sit<>stand from mod assist for PT to coimplete pericare. Stand pviot back to [facility] with mod assist and use of rail. Transported back to hall outside of gym. Gait training at rail x9f with max +2 assist for RLE advancement and WC following for safety. Patient returned to room and left sitting in recliner following max assist squat pivot transfer to the L. \Ccall bell in reach and all needs met.       PT Evaluation Precautions/Restrictions Precautions Precautions: Fall Precaution Comments: Significant weakness at RLE, impaired R UE proprioception Restrictions Weight Bearing Restrictions: No General   Vital Signs Pain Pain Assessment Pain Scale: 0-10 Pain Score: 5  Pain Type: Chronic pain Pain Location: Rectum Pain Descriptors / Indicators: Aching Pain Onset: On-going Home Living/Prior Functioning Home Living Living Arrangements: Children Available Help at Discharge: Family;Available PRN/intermittently;Personal care attendant Type of Home: House Home Access: Stairs to enter;Ramped entrance Entrance Stairs-Number of Steps: 2+2 Home Layout: Able to live on  main level with bedroom/bathroom Bathroom Shower/Tub: TChiropodist Handicapped height Bathroom Accessibility: Yes  Lives With: Other (Comment);Son Prior Function Level of Independence: Requires assistive device for independence;Needs assistance with homemaking Vocation: Retired Comments: had hired aide Tuesday and Fridays. Aware that she will need 24/7 asisst Vision/Perception  Perception Perception: Within Functional Limits Praxis Praxis: Intact  Cognition Overall Cognitive Status: Within Functional Limits for tasks assessed Arousal/Alertness: Awake/alert Attention:  Selective Selective Attention: Appears intact Memory: Appears intact Immediate Memory Recall: Blue;Bed;Sock Memory Recall Sock: Not able to recall Memory Recall Blue: Without Cue Memory Recall Bed: Without Cue Awareness: Impaired Awareness Impairment: Anticipatory impairment Problem Solving: Appears intact Safety/Judgment: Impaired Comments: slightly impulsive Sensation Sensation Light Touch: Impaired Detail Light Touch Impaired Details: Impaired RLE;Impaired RUE Proprioception: Impaired Detail Proprioception Impaired Details: Impaired RUE;Impaired RLE Coordination Gross Motor Movements are Fluid and Coordinated: No Fine Motor Movements are Fluid and Coordinated: No Coordination and Movement Description: servere ataxia in the RUE and LLE Motor  Motor Motor: Hemiplegia;Ataxia Motor - Skilled Clinical Observations: RUE < RLE.  Mobility Bed Mobility Bed Mobility: Rolling Right;Rolling Left;Sit to Supine;Supine to Sit Rolling Right: Moderate Assistance - Patient 50-74% Rolling Left: Moderate Assistance - Patient 50-74% Supine to Sit: Maximal Assistance - Patient - Patient 25-49% Sit to Supine: Maximal Assistance - Patient 25-49% Transfers Transfers: Sit to Stand;Squat Pivot Transfers;Stand Pivot Transfers Sit to Stand: Maximal Assistance - Patient 25-49% Stand Pivot Transfers: Maximal  Assistance - Patient 25 - 49% Stand Pivot Transfer Details: Verbal cues for sequencing;Verbal cues for technique;Tactile cues for weight shifting;Tactile cues for posture Squat Pivot Transfers: Maximal Assistance - Patient 25-49% Transfer (Assistive device): None Locomotion  Gait Ambulation: Yes Gait Assistance: 2 Helpers Assistive device: Other (Comment)(rail in hall) Gait Gait: Yes Gait Pattern: Impaired Gait Pattern: Step-to pattern;Ataxic Gait velocity: decreasecd Stairs / Additional Locomotion Stairs: No Wheelchair Mobility Wheelchair Mobility: No  Trunk/Postural Assessment  Cervical Assessment Cervical Assessment: Within Functional Limits Thoracic Assessment Thoracic Assessment: Exceptions to WFL(rounded shoulders) Lumbar Assessment Lumbar Assessment: Exceptions to WFL(posterior pelvic tilt) Postural Control Postural Control: Deficits on evaluation Righting Reactions: absent posterior and R Protective Responses: delayed on the R  Balance Balance Balance Assessed: Yes Static Sitting Balance Static Sitting - Balance Support: Feet supported Static Sitting - Level of Assistance: 4: Min assist Dynamic Sitting Balance Dynamic Sitting - Balance Support: Feet supported Dynamic Sitting - Level of Assistance: 4: Min Insurance risk surveyor Standing - Balance Support: No upper extremity supported Static Standing - Level of Assistance: 3: Mod assist Dynamic Standing Balance Dynamic Standing - Balance Support: Left upper extremity supported Dynamic Standing - Level of Assistance: 2: Max assist Extremity Assessment  RUE Assessment RUE Assessment: Exceptions to [facility] General Strength Comments: Strength 3+/5. Very poor proprioception RUE Body System: Neuro Brunstrum levels for arm and hand: Hand;Arm Brunstrum level for arm: Stage V Relative Independence from Synergy Brunstrum level for hand: Stage VI Isolated joint movements LUE Assessment LUE Assessment: Within  Functional Limits RLE Assessment RLE Assessment: Exceptions to [facility] General Strength Comments: grossly 2+/5 proximal to distal LLE Assessment LLE Assessment: Within Functional Limits General Strength Comments: grossly 4+/5 proximal to disal    Refer to Care Plan for Long Term Goals  Recommendations for other services: Therapeutic Recreation  Stress management and Outing/community reintegration  Discharge Criteria: Patient will be discharged from PT if patient refuses treatment 3 consecutive times without medical reason, if treatment goals not met, if there is a change in medical status, if patient makes no progress towards goals or if patient is discharged from hospital.  The above assessment, treatment plan, treatment alternatives and goals were discussed and mutually agreed upon: by patient  Lorie Phenix 09/09/2019, 4:23 PM

## 2019-09-09 NOTE — Progress Notes (Signed)
Team Conference Report to Patient/Family  Team Conference discussion was reviewed with the patient, including goals, any changes in plan of care and target discharge date.  Patient expresses understanding and is in agreement.  The patient has a target discharge date of  3-3.5 weeks with a goal of min assist-CGA overall.  Dorien Chihuahua B 09/09/2019, 4:37 PM

## 2019-09-09 NOTE — Progress Notes (Signed)
Patient arrived to rehab unit. This nurse informed patient on safety plan, rehab schedule and patient booklet. Call bell at bedside.

## 2019-09-09 NOTE — Care Management (Signed)
Patient Details  Name: Renee [name] MRN: [id number] Date of Birth: 01-29-1946  Today's Date: 09/09/2019  Problem List:  Patient Active Problem List   Diagnosis Date Noted  . Mass of parietal lobe 09/09/2019  . Spastic hemiparesis ([place])   . Iron deficiency anemia   . Drug induced constipation   . Steroid-induced hyperglycemia   . Prolonged Q-T interval on ECG   . 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 Right sided weakness 09/03/2019  . Constipation 07/08/2019  . GERD (gastroesophageal reflux disease) 07/08/2019  . History of breast cancer 12/27/2017  . Chronic renal disease, stage 3, moderately decreased glomerular filtration rate between 30-59 mL/min/1.73 square meter 12/27/2017  . CLL (chronic lymphocytic leukemia) ([place]) 02/21/2017  . Chronic depression 05/14/2015  . Rectal pain, chronic 02/10/2015  . Chronic pain 12/28/2013  . 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 Hypothyroidism 01/07/2013  . Vitamin D deficiency 01/07/2013   Past Medical History:  Past Medical History:  Diagnosis Date  . Chronic renal disease, stage 3, moderately decreased glomerular filtration rate between 30-59 mL/min/1.73 square meter 12/27/2017  . Constipation   . Depression   . History of breast cancer 12/27/2017  . Hypertension   . Leukemia ([place])    CLL  . PVD (peripheral vascular disease) ([place])   . Thyroid disease    Past Surgical History:  Past Surgical History:  Procedure Laterality Date  . ABDOMINAL HYSTERECTOMY    . BREAST SURGERY     Left Breast Lumpectomy  . COLON SURGERY    . HERNIA REPAIR  2000   Hiatal hernia repair     Social History:  reports that she quit smoking about 39 years ago. Her smoking use included cigarettes. She started smoking about 57 years ago. She smoked 0.75 packs per day. She has never used smokeless tobacco. She reports that she does not drink alcohol or use drugs.  Family / Support  Systems Patient Roles: Parent Children: Daughter:[name] [name] and Son Aaron Edelman Other Supports: Hired personal care attendant helps with bathing, dressing, cooking, cleaning and laundry Tues. and Thurs. Anticipated Caregiver: [name] after work and hired Engineer, production on [employer]. and Thurs. Ability/Limitations of Caregiver: Evorn Gong is at the home during the day, works nightshift part-time. Daughter works Games developer and has children at her home Caregiver Availability: 24/7  Social History Preferred language: English Religion: Non-Denominational Read: Yes Write: Yes   Abuse/Neglect Abuse/Neglect Assessment Can Be Completed: Yes Physical Abuse: Denies Verbal Abuse: Denies Sexual Abuse: Denies Exploitation of patient/patient's resources: Denies Self-Neglect: Denies Possible abuse reported to:: Other (Comment)(no)  Emotional Status Pt's affect, behavior and adjustment status: Normal mood and affect Psychiatric History: Anxiety disorder and depression  Patient / Family Perceptions, Expectations & Goals Pt/Family understanding of illness & functional limitations: Patient appears to have an understanding of illness and functional limitations Premorbid pt/family roles/activities: Son at the home is "just there". "I'm sure if I asked him to do something for me he would but I cannot count on him to provide care for me". Anticipated changes in roles/activities/participation: Personal care attendant is only in the home two days a week, looking for someone to come in on other days when daughter is at work. Pt/family expectations/goals: Patient would like to be as "independent as possible". Up until 2 weeks ago, "I was doing fine, then started having weakness in the arm and leg and fall multiple times".  [employer] available at  discharge: Daughter able to provide transportation at discharge  Discharge Planning Living Arrangements: [place]: Children, Other  (Comment)(Hired personal care attendant two days a week) Type of Residence: Private residence Does the patient have any problems obtaining your medications?: No Home Management: Hired attendant manages cooking, cleaning, laundry Patient/Family Preliminary Plans: Home with son who can provide supervision and daughter to provide intermittent minimal assistance; hired personal care attendant available two days a week. [place] Barriers to Discharge: Decreased caregiver support [place] Barriers to Discharge Comments: Hired attendant not available more than two days a week, son works part-time night shift and daughter works Games developer and lives about 40 minutes away from the patient Social Work Anticipated Follow Up Needs: HH/OP Expected length of stay: 3 - 3.5 weeks  Clinical Impression  Patient is very fatigued from initial day of therapy. Reported that she has asked the MD questions about the cause for her current state and decreased function in the right side. Appears to have a good understanding of her situation and functional status. She notes her daughter is working on putting together a plan for 24/7 care. Hired attendant is also searching for someone to assist on days she is not available. Family had ramp built for entry to home. History of anxiety disorder and depression although at present does not appear anxious/nervous about situation or express anxiety regarding planning for discharge. Dorien Chihuahua B 09/09/2019, 3:36 PM

## 2019-09-09 NOTE — Evaluation (Signed)
Occupational Therapy Assessment and Plan  Patient Details  Name: Renee [name] MRN: [id number] Date of Birth: [birth year]  OT Diagnosis: ataxia, hemiplegia affecting dominant side and muscle weakness (generalized) Rehab Potential: Rehab Potential (ACUTE ONLY): Good ELOS: 3-4 weeks   Today's Date: 09/09/2019 OT Individual Time: 1300-1410 OT Individual Time Calculation (min): 70 min     Problem List:  Patient Active Problem List   Diagnosis Date Noted  . Mass of parietal lobe 09/09/2019  . Spastic hemiparesis ([place])   . Iron deficiency anemia   . Drug induced constipation   . Steroid-induced hyperglycemia   . Prolonged Q-T interval on ECG   . 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 Right sided weakness 09/03/2019  . Constipation 07/08/2019  . GERD (gastroesophageal reflux disease) 07/08/2019  . History of breast cancer 12/27/2017  . Chronic renal disease, stage 3, moderately decreased glomerular filtration rate between 30-59 mL/min/1.73 square meter 12/27/2017  . CLL (chronic lymphocytic leukemia) ([place]) 02/21/2017  . Chronic depression 05/14/2015  . Rectal pain, chronic 02/10/2015  . Chronic pain 12/28/2013  . 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 Hypothyroidism 01/07/2013  . Vitamin D deficiency 01/07/2013    Past Medical History:  Past Medical History:  Diagnosis Date  . Chronic renal disease, stage 3, moderately decreased glomerular filtration rate between 30-59 mL/min/1.73 square meter 12/27/2017  . Constipation   . Depression   . History of breast cancer 12/27/2017  . Hypertension   . Leukemia ([place])    CLL  . PVD (peripheral vascular disease) ([place])   . Thyroid disease    Past Surgical History:  Past Surgical History:  Procedure Laterality Date  . ABDOMINAL HYSTERECTOMY    . BREAST SURGERY     Left Breast Lumpectomy  . COLON SURGERY    . HERNIA REPAIR  2000   Hiatal hernia repair      Assessment &  Plan Clinical Impression:Renee [name] is a 74 year old female with history of CKD, HTN, CLL, breast cancer s/p lumpectomy with XRT, pudendal neuralgia, anxiety disorder who was admitted on 09/04/19 with 2 to 3-week history of multiple falls with progressive weakness affecting FMC of right hand and right foot drop. MRI brain done showing 2 cm cystic lesion with vasogenic edema and question of tumor versus cerebritis. She was transferred to [facility] for further work-up and evaluated by neurology. She was started on Decadron and MRI brain with contrast showed 2 cm subcortical white matter lesion in parietal lobe without enhancement--cerebritis or subacute infarct felt to be unlikely. LP done showing elevated glucose with 1 WBC and 89 RBC. CSF sent for flow cytometry and JC virus. Dr. Malen Gauze recommends biopsy with PET scan and Dr. Mickeal Skinner consulted for input and felt that CNS involvement and CLL was very uncommon. He question nonneoplastic etiology such as vascular inflammatory process and recommend repeating contrast enhanced MRI in 1 month as well as decreasing Decadron to 4 mg daily at discharge. Patient to be discussed at tumor board on 1/11. Aggressive rehab recommended due to ongoing right-sided weakness affecting mobility and ADLs. CIR recommended due to functional decline. Patient transferred to CIR on 09/09/2019 .    Patient currently requires max with basic self-care skills secondary to muscle weakness, decreased cardiorespiratoy endurance, abnormal tone, unbalanced muscle activation, ataxia, decreased coordination and decreased motor planning, decreased problem solving and decreased safety awareness and decreased sitting balance, decreased standing balance, decreased postural control, hemiplegia  and decreased balance strategies.  Prior to hospitalization, patient could complete ADLs with modified independent .  Patient will benefit from skilled intervention to decrease level of assist with basic self-care skills  prior to discharge home with care partner.  Anticipate patient will require 24 hour supervision and minimal physical assistance and follow up home health.  OT - End of Session Activity Tolerance: Tolerates 10 - 20 min activity with multiple rests Endurance Deficit: Yes Endurance Deficit Description: generalized weakness OT Assessment Rehab Potential (ACUTE ONLY): Good OT Barriers to Discharge: Decreased caregiver support OT Barriers to Discharge Comments: pt will hire 24/7 aide support OT Patient demonstrates impairments in the following area(s): Balance;Endurance;Motor;Pain;Perception;Safety;Sensory OT Basic ADL's Functional Problem(s): Eating;Grooming;Bathing;Dressing;Toileting OT Transfers Functional Problem(s): Tub/Shower;Toilet OT Additional Impairment(s): None;Fuctional Use of Upper Extremity OT Plan OT Intensity: Minimum of 1-2 x/day, 45 to 90 minutes OT Frequency: 5 out of 7 days OT Duration/Estimated Length of Stay: 3-4 weeks OT Treatment/Interventions: Balance/vestibular training;Disease mangement/prevention;Neuromuscular re-education;Self Care/advanced ADL retraining;Therapeutic Exercise;Wheelchair propulsion/positioning;UE/LE Strength taining/ROM;Pain management;DME/adaptive equipment instruction;Community reintegration;Functional electrical stimulation;Patient/family education;UE/LE Coordination activities;Visual/perceptual remediation/compensation;Therapeutic Activities;Psychosocial support;Discharge planning;Functional mobility training OT Self Feeding Anticipated Outcome(s): set up assist OT Basic Self-Care Anticipated Outcome(s): (S) OT Toileting Anticipated Outcome(s): (S) OT Bathroom Transfers Anticipated Outcome(s): min A OT Recommendation Recommendations for Other Services: Neuropsych consult Patient destination: Home Follow Up Recommendations: Home health OT Equipment Recommended: To be determined   Skilled Therapeutic Intervention Skilled OT evaluation completed.  Pt pleasant and motivated throughout session, slightly impulsive, very receptive to education. Pt completes stand pivot transfers to the R with max A, very ataxic movements and R knee buckling. Pt can complete L transfers with a stable place to grab with the LUE with mod A. Pt required mod A to don LB clothing, min A UB. Hemi technique cueing provided. Pt edu on OT POC, ELOS, and rehab expectations. Pt's OOB tolerance limited by chronic rectal pain. Pt left supine with all needs met. Bed alarm set.   OT Evaluation Precautions/Restrictions  Precautions Precautions: Fall Precaution Comments: Significant weakness at RLE, impaired R UE proprioception Restrictions Weight Bearing Restrictions: No General Chart Reviewed: Yes Family/Caregiver Present: No Vital Signs  Pain Pain Assessment Pain Scale: 0-10 Pain Score: 5  Pain Type: Chronic pain Pain Location: Rectum Pain Descriptors / Indicators: Aching Pain Onset: On-going Home Living/Prior Functioning Home Living Available Help at Discharge: Family, Available PRN/intermittently, Personal care attendant Type of Home: House Home Access: Stairs to enter, Ramped entrance Technical brewer of Steps: 2+2 Home Layout: Able to live on main level with bedroom/bathroom Bathroom Shower/Tub: Chiropodist: Handicapped height Bathroom Accessibility: Yes  Lives With: Other (Comment), Son IADL History Homemaking Responsibilities: Yes Meal Prep Responsibility: Secondary Laundry Responsibility: Secondary Cleaning Responsibility: Secondary Bill Paying/Finance Responsibility: Secondary Shopping Responsibility: Secondary Prior Function Level of Independence: Requires assistive device for independence, Needs assistance with homemaking  Able to Take Stairs?: Yes Driving: Yes Vocation: Retired Comments: had hired aide Tuesday and Fridays. Aware that she will need 24/7 asisst Vision Baseline Vision/History: Wears glasses Wears  Glasses: Reading only Patient Visual Report: No change from baseline Vision Assessment?: No apparent visual deficits Perception  Perception: Within Functional Limits Praxis Praxis: Intact Cognition Overall Cognitive Status: Within Functional Limits for tasks assessed Arousal/Alertness: Awake/alert Orientation Level: Person;Place;Situation Person: Oriented Place: Oriented Situation: Oriented Year: 2021 Month: January Day of Week: Correct Memory: Appears intact Immediate Memory Recall: Blue;Bed;Sock Memory Recall Sock: Not able to recall Memory Recall Blue: Without Cue Memory Recall Bed: Without Cue Attention: Selective  Selective Attention: Appears intact Awareness: Impaired Awareness Impairment: Anticipatory impairment Problem Solving: Appears intact Problem Solving Impairment: Functional complex Safety/Judgment: Impaired Comments: slightly impulsive Sensation Sensation Light Touch: Impaired Detail Light Touch Impaired Details: Impaired RLE;Impaired RUE Proprioception: Impaired Detail Proprioception Impaired Details: Impaired RUE;Impaired RLE Coordination Gross Motor Movements are Fluid and Coordinated: No Fine Motor Movements are Fluid and Coordinated: No Coordination and Movement Description: servere ataxia in the RUE and LLE Motor  Motor Motor: Hemiplegia;Ataxia Motor - Skilled Clinical Observations: RUE < RLE. Mobility  Bed Mobility Bed Mobility: Rolling Right;Rolling Left;Sit to Supine;Supine to Sit Rolling Right: Moderate Assistance - Patient 50-74% Rolling Left: Moderate Assistance - Patient 50-74% Supine to Sit: Maximal Assistance - Patient - Patient 25-49% Sit to Supine: Maximal Assistance - Patient 25-49% Transfers Sit to Stand: Maximal Assistance - Patient 25-49%  Trunk/Postural Assessment  Cervical Assessment Cervical Assessment: Within Functional Limits Thoracic Assessment Thoracic Assessment: Exceptions to WFL(rounded shoulders) Lumbar  Assessment Lumbar Assessment: Exceptions to WFL(posterior pelvic tilt) Postural Control Postural Control: Deficits on evaluation Righting Reactions: absent posterior and R Protective Responses: delayed on the R  Balance Balance Balance Assessed: Yes Static Sitting Balance Static Sitting - Balance Support: Feet supported Static Sitting - Level of Assistance: 4: Min assist Dynamic Sitting Balance Dynamic Sitting - Balance Support: Feet supported Dynamic Sitting - Level of Assistance: 4: Min assist Static Standing Balance Static Standing - Balance Support: No upper extremity supported Static Standing - Level of Assistance: 3: Mod assist Dynamic Standing Balance Dynamic Standing - Balance Support: Left upper extremity supported Dynamic Standing - Level of Assistance: 2: Max assist Extremity/Trunk Assessment RUE Assessment RUE Assessment: Exceptions to [facility] General Strength Comments: Strength 3+/5. Very poor proprioception RUE Body System: Neuro Brunstrum levels for arm and hand: Hand;Arm Brunstrum level for arm: Stage V Relative Independence from Synergy Brunstrum level for hand: Stage VI Isolated joint movements LUE Assessment LUE Assessment: Within Functional Limits     Refer to Care Plan for Long Term Goals  Recommendations for other services: Neuropsych   Discharge Criteria: Patient will be discharged from OT if patient refuses treatment 3 consecutive times without medical reason, if treatment goals not met, if there is a change in medical status, if patient makes no progress towards goals or if patient is discharged from hospital.  The above assessment, treatment plan, treatment alternatives and goals were discussed and mutually agreed upon: by patient  Curtis Sites 09/09/2019, 3:31 PM

## 2019-09-09 NOTE — H&P (Signed)
Physical Medicine and Rehabilitation Admission H&P     Chief Complaint  Patient presents with  . Functional decline due to brain mass with right foot drop  HPI: Renee [name] is a 74 year old female with history of CKD, HTN, CLL, breast cancer s/p lumpectomy with XRT, pudendal neuralgia, anxiety disorder who was admitted on 09/04/19 with 2 to 3-week history of multiple falls with progressive weakness affecting FMC of right hand and right foot drop. MRI brain done showing 2 cm cystic lesion with vasogenic edema and question of tumor versus cerebritis. She was transferred to [facility] for further work-up and evaluated by neurology. She was started on Decadron and MRI brain with contrast showed 2 cm subcortical white matter lesion in parietal lobe without enhancement--cerebritis or subacute infarct felt to be unlikely. LP done showing elevated glucose with 1 WBC and 89 RBC. CSF sent for flow cytometry and JC virus. Dr. Malen Gauze recommends biopsy with PET scan and Dr. Mickeal Skinner consulted for input and felt that CNS involvement and CLL was very uncommon. He question nonneoplastic etiology such as vascular inflammatory process and recommend repeating contrast enhanced MRI in 1 month as well as decreasing Decadron to 4 mg daily at discharge. Patient to be discussed at tumor board on 1/11. Aggressive rehab recommended due to ongoing right-sided weakness affecting mobility and ADLs. CIR recommended due to functional decline.  Per her daughter, daughter lives 40 minutes away, son lives with pt, but just "there" and cannot help a lot. Trying to arrange 24 hours care and will have it in place in 1-2 weeks.  LBM today- always constipated.  Esp since they increased her Methadone for rectal pain 3-4 months ago.  Review of Systems  Respiratory: Positive for shortness of breath (DOE).  Gastrointestinal: Positive for constipation.  Musculoskeletal: Positive for falls.  Rectal pain- chronic  Neurological: Positive for sensory  change (Left foot), focal weakness and weakness.  All other systems reviewed and are negative.       Past Medical History:  Diagnosis Date  . Chronic renal disease, stage 3, moderately decreased glomerular filtration rate between 30-59 mL/min/1.73 square meter 12/27/2017  . Constipation   . Depression   . History of breast cancer 12/27/2017  . Hypertension   . Leukemia ([place])    CLL  . PVD (peripheral vascular disease) ([facility])   . Thyroid disease         Past Surgical History:  Procedure Laterality Date  . ABDOMINAL HYSTERECTOMY    . BREAST SURGERY     Left Breast Lumpectomy  . COLON SURGERY    . HERNIA REPAIR  2000   Hiatal hernia repair         Family History  Problem Relation Age of Onset  . Healthy Mother   . Healthy Father   . Healthy Sister    "normal stuff for her age"  . Pancreatic cancer Brother    Social History: Has a son who stays with her on and off. She was independent and driving till 3 weeks prior to admission. She hired help and started using a walker 2 weeks prior to admission. She reports that she quit smoking about 39 years ago. Her smoking use included cigarettes. She started smoking about 57 years ago. She smoked 0.75 packs per day. She has never used smokeless tobacco. She reports that she does not drink alcohol or use drugs.       Allergies  Allergen Reactions  . Bupropion Other (See Comments)  Fidgety   . Clonidine Derivatives Other (See Comments)  . Codeine Nausea Only  . Diclofenac-Misoprostol Other (See Comments)    Fidgety   . Olanzapine Other (See Comments)    Fidgety   . Pregabalin Other (See Comments) and Rash  . Valdecoxib Other (See Comments)  . Zonisamide Other (See Comments)    Fidgety          Medications Prior to Admission  Medication Sig Dispense Refill  . clonazePAM (KLONOPIN) 0.5 MG tablet Take 0.5 mg by mouth 2 (two) times daily.     . DULoxetine (CYMBALTA) 60 MG capsule Take 60 mg by mouth daily.    [name] Kitchen ENULOSE 10 GM/15ML  SOLN TAKE 15 MLS (10 G TOTAL) BY MOUTH TWO (2) (TWO) TIMES DAILY AS NEEDED FOR MILD CONSTIPATION OR SEVERE CONSTIPATION.  236 mL 0  . KRILL OIL PO Take 900 mg by mouth daily.    [name] Kitchen levothyroxine (SYNTHROID, LEVOTHROID) 100 MCG tablet Take 100 mcg by mouth daily before breakfast.    . linaclotide (LINZESS) 290 MCG CAPS capsule Take 1 capsule (290 mcg total) by mouth daily before breakfast. 30 capsule 1  . LINZESS 72 MCG capsule     . lurasidone (LATUDA) 40 MG TABS tablet Take 40 mg by mouth daily with breakfast.    . methadone (DOLOPHINE) 10 MG tablet 1.5 tabs ( 15 mg ) bid and 1 tab at HS, 4 per day    . pantoprazole (PROTONIX) 40 MG tablet Take 40 mg by mouth daily.    . polyethylene glycol (MIRALAX / GLYCOLAX) packet Take 17 g by mouth daily.    . rosuvastatin (CRESTOR) 20 MG tablet Take 20 mg by mouth daily.    [name] Kitchen senna-docusate (SENOKOT-S) 8.6-50 MG tablet Take 1 tablet by mouth 2 (two) times daily. 60 tablet 3  . Calcium Carbonate-Vitamin D (CALCIUM HIGH POTENCY/VITAMIN D) 600-200 MG-UNIT TABS Take by mouth daily.     [name] Kitchen lactulose (CHRONULAC) 10 GM/15ML solution Take 15 mLs (10 g total) by mouth 4 (four) times daily as needed for mild constipation or severe constipation. 236 mL 5  . lubiprostone (AMITIZA) 8 MCG capsule Take 1 capsule (8 mcg total) by mouth 2 (two) times daily with a meal. 60 capsule 0  . morphine (MSIR) 15 MG tablet Take 15 mg by mouth 4 (four) times daily as needed.    . pantoprazole (PROTONIX) 20 MG tablet Take 1 tablet (20 mg total) by mouth 2 (two) times daily. 60 tablet 1  Drug Regimen Review  Drug regimen was reviewed and remains appropriate with no significant issues identified  Home:  Home Living  Family/patient expects to be discharged to:: Private residence  Living Arrangements: Children(son who is 48 years old has lived with her for 2 to 3 years)  Available Help at Discharge: Family, Available 24 hours/day(daughter and pt aware that 24/7 supervision will be needed)   Type of Home: House  Home Access: Stairs to enter  [employer] of Steps: 2  Home Layout: Two level, Able to live on main level with bedroom/bathroom  Bathroom Shower/Tub: Administrator, Civil Service: Handicapped height  Bathroom Accessibility: Yes  Home Equipment: Bedside commode, Environmental consultant - 2 wheels, Wheelchair - manual  Lives With: Other (Comment)(son lives with patient)  Functional History:  Prior Function  Level of Independence: Needs assistance  Gait / Transfers Assistance Needed: using walker, frequent falls  ADL's / Homemaking Assistance Needed: Been doing sponge baths. Aide coming on Tu/Th to help with  bathing, dressing, cooking/cleaning, laundry. Pt has required increased assist with ADL's over past week in setting of frequent falling.  Comments: had hired aide Tuesday and Fridays. Aware that she will need 24/7 asisst  Functional Status:  Mobility:  Bed Mobility  Overal bed mobility: Needs Assistance  Bed Mobility: Sit to Supine  Supine to sit: Min assist  Sit to supine: Mod assist  General bed mobility comments: Mod assistance to lift B feet back to bed against gravity. Pt performed boosting to [facility] with B UEs and bed placed in trendelberg position.  Transfers  Overall transfer level: Needs assistance  Equipment used: Rolling walker (2 wheeled)  Transfers: Sit to/from Stand  Sit to Stand: Mod assist  Stand pivot transfers: Max assist  General transfer comment: MOd assistance to rise into standing. Pt required assistance for R hand hand placement and assistance to adjust RLE to achieve standing. Perfomed x 3 trials. Pt limited due to R sided weakness and ataxia  Ambulation/Gait  Ambulation/Gait assistance: Mod assist, +2 physical assistance(Max +1 for 6 ft and mod +2 for 8 ft)  Gait Distance (Feet): 6 Feet(8 ft)  Assistive device: Rolling walker (2 wheeled)  Gait Pattern/deviations: Decreased stance time - right, Decreased dorsiflexion - right, Step-to  pattern, Decreased weight shift to left  General Gait Details: Pt performed 6 ft with poor foot clearance and dorsiflexion on her R side. She required assistance to weight shift L and advance R foot forward. Gt distance limited due to need to have BM. Once in sitting applied ace wrap to R side to achieve dorsiflexion.  Gait velocity: decreasecd  Gait velocity interpretation: <1.8 ft/sec, indicate of risk for recurrent falls   ADL:  ADL  Overall ADL's : Needs assistance/impaired  Eating/Feeding: Set up, Sitting  Grooming: Oral care, Maximal assistance, Standing, Brushing hair, Sitting, Min guard  Grooming Details (indicate cue type and reason): Pt requiring Max A for standing balance while at sink performing oral care. Pt with significant lean to right and required cues for correction. Decreased coorindation and grasp strength at RUE as she managed tooth brush and tooth paste. Able to brush her hair while seated in recliner  Upper Body Bathing: Minimal assistance, Sitting  Lower Body Bathing: Moderate assistance, +2 for physical assistance, Sit to/from stand  Upper Body Dressing : Minimal assistance, Sitting  Lower Body Dressing: Moderate assistance, +2 for safety/equipment, Sit to/from stand  Lower Body Dressing Details (indicate cue type and reason): Pt donning her left shoe with Mod A for postural control and sitting balance (lateral leaning to right). Requriing assistance to bring right ankle to knee and then Mod A for sitting balance when donning right shoe.  Toilet Transfer: Moderate assistance, +2 for physical assistance, Ambulation, BSC, RW  Toilet Transfer Details (indicate cue type and reason): Mod A for power up and to maintain balance during mobility. Cues for hand placement at [place] and Hygiene: Moderate assistance, +2 for physical assistance, Sit to/from stand  Toileting - Clothing Manipulation Details (indicate cue type and reason): Pt able to perform  peri care in standing with Mod A +2 for standing balance  Functional mobility during ADLs: Moderate assistance, +2 for physical assistance, Rolling walker  General ADL Comments: Pt presenting with decreased balance, strength, and coorindation at RUE/RLE.  Cognition:  Cognition  Overall Cognitive Status: Impaired/Different from baseline  Orientation Level: Oriented X4  Cognition  Arousal/Alertness: Awake/alert  Behavior During Therapy: WFL for tasks assessed/performed  Overall Cognitive Status: Impaired/Different  from baseline  Area of Impairment: Attention, Awareness  Current Attention Level: Sustained  Awareness: Intellectual, Anticipatory  General Comments: pt with decreased attention to and awareness of R side during mobility, cues for attention to R UE. Decreased awareness of R lateral lean at times, cues to correct  Blood pressure 117/67, pulse 75, temperature 98.7 F (37.1 C), temperature source Oral, resp. rate 16, height 6\' 1"  (1.854 m), weight 83.5 kg, SpO2 96 %.  Physical Exam  Nursing note and vitals reviewed.  Constitutional: She is oriented to person, place, and time. She appears well-developed and well-nourished. No distress.  Lying supine in bed with daughter at bedside, NAD R handed  HENT:  Head: Normocephalic and atraumatic.  Mouth/Throat: No oropharyngeal exudate.  Face symmetrical- tongue midline; smile equal  Eyes: Pupils are equal, round, and reactive to light. Conjunctivae and EOM are normal. No scleral icterus.  Neck: No tracheal deviation present.  Cardiovascular:  RRR  Respiratory:  CTA B/L  GI:  Soft, NT, ND, (+) hypoactive BS  Genitourinary: Genitourinary Comments: Has purwick  Musculoskeletal:  Cervical back: Normal range of motion and neck supple.  Comments: LUE 5/5 in deltoid,biceps,triceps, WE, grip and finger abd RUE- delt/bicep 5-/5, WE 4+/5, tricep 5-/5, grip 4+/5, finger abd 4+/5  LLE_ 5/5 in HF/KE/KF/DF/PF/EHL RLE- HF 2/5, KE/KF 2/5, DF  2-/5, PF 2-/5, EHL 0/5  Getting contracture of R ankle- has ~ 45-60 degrees of R ankle DF- lacking significant movement lacking neutral  Neurological: She is alert and oriented to person, place, and time. She exhibits normal muscle tone.  NO increased tone RUE, LUE, LLE but MAS of RLE is 2 of knee and hip No hoffman's B/L No clonus, but R ankle is contracted  Skin: Skin is warm and dry. She is not diaphoretic.  Psychiatric: She has a normal mood and affect.   Lab Results Last 48 Hours        Results for orders placed or performed during the hospital encounter of 09/03/19 (from the past 48 hour(s))  Glucose, capillary Status: Abnormal   Collection Time: 09/06/19 4:18 PM  Result Value Ref Range   Glucose-Capillary 175 (H) 70 - 99 mg/dL  Glucose, capillary Status: Abnormal   Collection Time: 09/06/19 9:12 PM  Result Value Ref Range   Glucose-Capillary 165 (H) 70 - 99 mg/dL  Glucose, capillary Status: Abnormal   Collection Time: 09/07/19 6:18 AM  Result Value Ref Range   Glucose-Capillary 112 (H) 70 - 99 mg/dL  Glucose, capillary Status: Abnormal   Collection Time: 09/07/19 11:47 AM  Result Value Ref Range   Glucose-Capillary 104 (H) 70 - 99 mg/dL  Glucose, capillary Status: Abnormal   Collection Time: 09/07/19 4:18 PM  Result Value Ref Range   Glucose-Capillary 105 (H) 70 - 99 mg/dL  Glucose, capillary Status: Abnormal   Collection Time: 09/07/19 9:23 PM  Result Value Ref Range   Glucose-Capillary 157 (H) 70 - 99 mg/dL  Basic metabolic panel Status: Abnormal   Collection Time: 09/08/19 3:13 AM  Result Value Ref Range   Sodium 139 135 - 145 mmol/L   Potassium 4.6 3.5 - 5.1 mmol/L   Chloride 105 98 - 111 mmol/L   CO2 24 22 - 32 mmol/L   Glucose, Bld 133 (H) 70 - 99 mg/dL   BUN 22 8 - 23 mg/dL   Creatinine, Ser 1.03 (H) 0.44 - 1.00 mg/dL   Calcium 8.9 8.9 - 10.3 mg/dL   GFR calc non Af Amer 55 (L) >60  mL/min   GFR calc Af Amer >60 >60 mL/min   Anion gap 10 5 - 15     Comment: Performed at [facility] [address], [place], [place] [address]  Magnesium Status: None   Collection Time: 09/08/19 3:13 AM  Result Value Ref Range   Magnesium 2.3 1.7 - 2.4 mg/dL    Comment: Performed at [facility] [address], [place], [place] [address]  Glucose, capillary Status: Abnormal   Collection Time: 09/08/19 6:04 AM  Result Value Ref Range   Glucose-Capillary 107 (H) 70 - 99 mg/dL  Glucose, capillary Status: None   Collection Time: 09/08/19 12:24 PM  Result Value Ref Range   Glucose-Capillary 93 70 - 99 mg/dL   Comment 1 Notify RN    Comment 2 Document in Chart    Imaging Results (Last 48 hours)  No results found.  Medical Problem List and Plan:  1. 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 Antithrombotics:  -DVT/anticoagulation: Pharmaceutical: Lovenox  -antiplatelet therapy: n/a  3. Chronic pudendal pain/Pain Management: cymbalta with Continue methadone bid  4. Mood: LCSW to follow for evaluation and support.  -antipsychotic agents: N/A  5. Neuropsych: This patient is? capable of making decisions on her own behalf.  6. Skin/Wound Care: Routine pressure relief measures.  7. Fluids/Electrolytes/Nutrition: Monitor I/O  8. 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 Improved.  9. Hypothyroid: Resume supplement.  10. 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 On Keppra and decadron-->tapered to bid on 1/5  12. Steroid induced hyperglycemia: Hgb A1c-5.4. Recommendations to reduce decadron to 4 mg at discharge.  13. OIC: On multiple medications--Laculose, Linzess, Miralax and senna--> increase to 2 bid  14. Breast cancer in remission/CLL being observed: continue to monitor white count 79.9--> 80.9  14. Iron deficiently anemia: Add iron supplement  15.  Spasticity- /contracture formation of R ankle- suggest Baclofen 5 mg TID to slow contracture formation of R ankle, but would assess as well for Botox of R ankle since has forming PF contracture due to spasticity and weakness- explained side effects of Baclofen, but explained usually not severe constipation at low doses- per primary rehab team.  Bary Leriche- PA-C 09/08/2019  I have personally performed a face to face diagnostic evaluation of this patient and formulated the key components of the plan.  Additionally, I have personally reviewed laboratory data, imaging studies, as well as relevant notes and concur with the physician assistant's documentation above.   The patient's status has not changed from the original H&P.  Any changes in documentation from the acute care chart have been noted above.

## 2019-09-09 NOTE — Plan of Care (Signed)
  Problem: Consults Goal: RH GENERAL PATIENT EDUCATION Description: See Patient Education module for education specifics. Outcome: Progressing Goal: Nutrition Consult-if indicated Outcome: Progressing   Problem: RH BOWEL ELIMINATION Goal: RH STG MANAGE BOWEL WITH ASSISTANCE Description: STG Manage Bowel with Assistance. Do not allow ambulation with our saff assistance - mod assist Outcome: Progressing   Problem: RH BLADDER ELIMINATION Goal: RH STG MANAGE BLADDER WITH ASSISTANCE Description: STG Manage Bladder With Assistance Outcome: Progressing Goal: RH STG MANAGE BLADDER WITH EQUIPMENT WITH ASSISTANCE Description: STG Manage Bladder With Equipment With Assistance Outcome: Progressing   Problem: RH SKIN INTEGRITY Goal: RH STG SKIN FREE OF INFECTION/BREAKDOWN Outcome: Progressing   Problem: RH SAFETY Goal: RH STG ADHERE TO SAFETY PRECAUTIONS W/ASSISTANCE/DEVICE Description: STG Adhere to Safety Precautions With Assistance/Device. Outcome: Progressing   Problem: RH PAIN MANAGEMENT Goal: RH STG PAIN MANAGED AT OR BELOW PT'S PAIN GOAL Outcome: Progressing

## 2019-09-09 NOTE — Care Management (Signed)
Inpatient [facility] Individual Statement of Services  Patient Name:  Renee [name]  Date:  09/09/2019  Welcome to the [facility].  Our goal is to provide you with an individualized program based on your diagnosis and situation, designed to meet your specific needs.  With this comprehensive rehabilitation program, you will be expected to participate in at least 3 hours of rehabilitation therapies Monday-Friday, with modified therapy programming on the weekends.  Your rehabilitation program will include the following services:  Physical Therapy (PT), Occupational Therapy (OT), 24 hour per day rehabilitation nursing, Neuropsychology, Case Management (Social Worker), Rehabilitation Medicine, Nutrition Services and Pharmacy Services  Weekly team conferences will be held on Wednesdays to discuss your progress.  Your Social Worker will talk with you frequently to get your input and to update you on team discussions.  Team conferences with you and your family in attendance may also be held.  Expected length of stay: 3-3.5 weeks  Overall anticipated outcome: Minimal assistance for transfers, gait and car transfer/CGA touching assistance.  Depending on your progress and recovery, your program may change. Your Social Worker will coordinate services and will keep you informed of any changes. Your Social Worker's name and contact numbers are listed  below.  The following services may also be recommended but are not provided by the [facility]:   [place] will be made to provide these services after discharge if needed.  Arrangements include referral to agencies that provide these services.  Your insurance has been verified to be:  [employer] Your primary doctor is:  Ephriam Jenkins, NP  Pertinent information will be shared with your doctor and your insurance  company.  Social Worker:  [place], [place] or ([phone]   Information discussed with and copy given to patient by: Margarito Liner, 09/09/2019, 11:22 AM

## 2019-09-09 NOTE — IPOC Note (Signed)
Individualized overall Plan of Care [facility]) Patient Details Name: Renee [name] MRN: [id number] DOB: December 19, 1945  Admitting Diagnosis: Mass of parietal lobe  Hospital Problems: Principal Problem:   Mass of parietal lobe Active Problems:   Spastic hemiparesis (HCC)   Iron deficiency anemia   Drug induced constipation   Steroid-induced hyperglycemia   Prolonged Q-T interval on ECG   Low grade glioma of brain [facility])     Functional Problem List: Nursing Bladder, Bowel, Endurance, Motor, Safety, Skin Integrity, Pain  PT Balance, Behavior, Endurance, Motor, Pain, Perception, Safety, Sensory, Skin Integrity  OT Balance, Endurance, Motor, Pain, Perception, Safety, Sensory  SLP    TR         Basic ADL's: OT Eating, Grooming, Bathing, Dressing, Toileting     Advanced  ADL's: OT       Transfers: PT Bed Mobility, Bed to Chair, Car, [employer], Floor  OT Tub/Shower, Agricultural engineer: PT Ambulation, Emergency planning/management officer, Stairs     Additional Impairments: OT None, Fuctional Use of Upper Extremity  SLP        TR      Anticipated Outcomes Item Anticipated Outcome  Self Feeding set up assist  Swallowing      Basic self-care  (S)  Toileting  (S)   Bathroom Transfers (S)  Bowel/Bladder  Patient will be continent of bladder/ bowel with mid assistance  Transfers  min assist with LRAD  Locomotion  Min assist ambulatory for short distances. supervision assist WC mobility.  Communication     Cognition     Pain  < 3  Safety/Judgment  Patient will be free of falls and injuries while maintaining safey measure - min assist   Therapy Plan: PT Intensity: Minimum of 1-2 x/day ,45 to 90 minutes PT Frequency: 5 out of 7 days PT Duration Estimated Length of Stay: 21-25 days OT Intensity: Minimum of 1-2 x/day, 45 to 90 minutes OT Frequency: 5 out of 7 days OT Duration/Estimated Length of Stay: 2-3 weeks      Team Interventions: Nursing Interventions Patient/Family  Education, Bladder Management, Bowel Management, Disease Management/Prevention, Pain Management, Medication Management, Discharge Planning, Skin Care/Wound Management  PT interventions Ambulation/gait training, Discharge planning, Functional mobility training, Psychosocial support, Visual/perceptual remediation/compensation, Therapeutic Activities, Balance/vestibular training, Disease management/prevention, Neuromuscular re-education, Skin care/wound management, Therapeutic Exercise, Wheelchair propulsion/positioning, UE/LE Strength taining/ROM, Splinting/orthotics, Pain management, DME/adaptive equipment instruction, Cognitive remediation/compensation, Community reintegration, Barrister's clerk education, IT trainer, UE/LE Coordination activities, Functional electrical stimulation  OT Interventions Training and development officer, Disease mangement/prevention, Neuromuscular re-education, Self Care/advanced ADL retraining, Therapeutic Exercise, Wheelchair propulsion/positioning, UE/LE Strength taining/ROM, Pain management, DME/adaptive equipment instruction, Community reintegration, Technical sales engineer stimulation, Patient/family education, UE/LE Coordination activities, Visual/perceptual remediation/compensation, Therapeutic Activities, Psychosocial support, Discharge planning, Functional mobility training  SLP Interventions    TR Interventions    SW/CM Interventions     Barriers to Discharge MD  Medical stability  Nursing      PT Inaccessible home environment, Decreased caregiver support, Home environment access/layout    OT Decreased caregiver support pt will hire 24/7 aide support  SLP      SW       Team Discharge Planning: Destination: PT-Home ,OT- Home , SLP-  Projected Follow-up: PT-Home health PT, OT-  Home health OT, SLP-  Projected Equipment Needs: PT-To be determined, OT- To be determined, SLP-  Equipment Details: PT- , OT-  Patient/family involved in discharge planning: PT-  Patient,  OT-Patient, SLP-   MD ELOS: 14-17 days. Medical Rehab Prognosis:  Good Assessment:  74 year old female with history of CKD, HTN, CLL, breast cancer s/p lumpectomy with XRT, pudendal neuralgia, anxiety disorder who was admitted on 09/04/19 with 2 to 3-week history of multiple falls with progressive weakness affecting FMC of right hand and right foot drop. MRI brain done showing 2 cm cystic lesion with vasogenic edema and question of tumor versus cerebritis. She was transferred to [facility] for further work-up and evaluated by neurology. She was started on Decadron and MRI brain with contrast showed 2 cm subcortical white matter lesion in parietal lobe without enhancement--cerebritis or subacute infarct felt to be unlikely. LP done showing elevated glucose with 1 WBC and 89 RBC. CSF sent for flow cytometry and JC virus. Dr. Malen Gauze recommends biopsy with PET scan and Dr. Mickeal Skinner consulted for input and felt that CNS involvement and CLL was very uncommon. He question nonneoplastic etiology such as vascular inflammatory process and recommend repeating contrast enhanced MRI in 1 month as well as decreasing Decadron to 4 mg daily at discharge. Patient to be discussed at tumor board on 1/11. Aggressive rehab recommended due to ongoing right-sided weakness affecting mobility and ADLs. CIR recommended due to functional decline. Patient with resulting functional deficits with mobility, transfers, endurance, self-care.  Will set goals for Supervision with PT/OT.   Due to the current state of emergency, patients may not be receiving their 3-hours of Medicare-mandated therapy.  See Team Conference Notes for weekly updates to the plan of care

## 2019-09-09 NOTE — Patient Care Conference (Signed)
Inpatient RehabilitationTeam Conference and Plan of Care Update Date: 09/09/2019   Time: 8:19 PM    Patient Name: Renee [name]      Medical Record Number: [id number]  Date of Birth: [birth year] Sex: Female         Room/Bed: 4W15C/4W15C-01 Payor Info: Payor: [place] / Plan: BCBS MEDICARE / Product Type: *No Product type* /    Admit Date/Time:  09/09/2019 12:32 AM  Primary Diagnosis:  Mass of parietal lobe  Patient Active Problem List   Diagnosis Date Noted  . Mass of parietal lobe 09/09/2019  . Spastic hemiparesis ([place])   . Iron deficiency anemia   . Drug induced constipation   . Steroid-induced hyperglycemia   . Prolonged Q-T interval on ECG   . Low grade glioma of brain ([place])   . 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 Right sided weakness 09/03/2019  . Constipation 07/08/2019  . GERD (gastroesophageal reflux disease) 07/08/2019  . History of breast cancer 12/27/2017  . Chronic renal disease, stage 3, moderately decreased glomerular filtration rate between 30-59 mL/min/1.73 square meter 12/27/2017  . CLL (chronic lymphocytic leukemia) ([place]) 02/21/2017  . Chronic depression 05/14/2015  . Rectal pain, chronic 02/10/2015  . Chronic pain 12/28/2013  . 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 Hypothyroidism 01/07/2013  . Vitamin D deficiency 01/07/2013    Expected Discharge Date: Expected Discharge Date: (Estimated LOS 3 to 3 1/2 weeks)  Team Members Present: Physician leading conference: Dr. Delice Lesch Social Worker Present: Lennart Pall, LCSW Nurse Present: Rayetta Pigg, RN Case Manager: Karene Fry, RN PT Present: Michaelene Song, PT OT Present: Meriel Pica, OT SLP Present: Jettie Booze, CF-SLP PPS Coordinator present : Ileana Ladd, Burna Mortimer, SLP     Current Status/Progress Goal Weekly Team Focus  Bowel/Bladder   continent of b/b; LBM: 01/05  maintian regular bowel pattern  assist with tolieting need prn    Swallow/Nutrition/ Hydration             ADL's   Eval pending        Mobility   max assist for bed mobility, transfers, and +2 gait at rail in hall with PT advancing the RLE. able to  coimplete toilet transfer with rail and mod assist  min assist goals  balance, strength, gait, R NMR   Communication             Safety/Cognition/ Behavioral Observations            Pain   no c/o pain  remain pain free  assess pain level QS and prn   Skin   skin intact  maintain skin integrity  assess skin QS and prn    Rehab Goals Patient on target to meet rehab goals: Yes *See Care Plan and progress notes for long and short-term goals.     Barriers to Discharge  Current Status/Progress Possible Resolutions Date Resolved   Nursing                  PT  Inaccessible home environment;Decreased caregiver support;Home environment access/layout                 OT Decreased caregiver support  pt will hire 24/7 aide support             SLP                SW Decreased caregiver support Hired attendant not available more than two days a  week, son works part-time night shift and daughter works Games developer and lives about 40 minutes away from the patient            Discharge Planning/Teaching Needs:  New admit - per [facility], plan for home with adult children providing 24/7 assistance.  TEaching needs TBD   Team Discussion: Hyperglycemia, constipation, chronic pain, EKG ordered, on keppra, on decadron, R tone.  RN cont/inc, on methadone for pain.  OT eval pending.  PT max bed, transfers max A, min A +2 rail amb in hallway, decreased attention R side, min A gait goals.   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 Weekly Focus/Goal: Improve mobility, pain, constipation, spasticity  Barriers to Discharge: Medical stability   Possible Resolutions to Barriers: Therapies,  consider spasticity meds, follow labs, ECG ordered   Continued Need for Acute Rehabilitation Level of Care: The patient requires daily medical management by a physician with specialized training in physical medicine and rehabilitation for the following reasons: Direction of a multidisciplinary physical rehabilitation program to maximize functional independence : Yes Medical management of patient stability for increased activity during participation in an intensive rehabilitation regime.: Yes Analysis of laboratory values and/or radiology reports with any subsequent need for medication adjustment and/or medical intervention. : Yes   I attest that I was present, lead the team conference, and concur with the assessment and plan of the team.   Retta Diones 09/09/2019, 8:19 PM   Team conference was held via web/ teleconference due to [place] - 19

## 2019-09-09 NOTE — Progress Notes (Signed)
Inpatient Rehabilitation  Patient information reviewed and entered into eRehab system by Esmay Amspacher M. Kimble Hitchens, M.A., CCC/SLP, PPS Coordinator.  Information including medical coding, functional ability and quality indicators will be reviewed and updated through discharge.    

## 2019-09-10 ENCOUNTER — Inpatient Hospital Stay (HOSPITAL_COMMUNITY): Payer: Medicare Other | Admitting: Occupational Therapy

## 2019-09-10 ENCOUNTER — Inpatient Hospital Stay (HOSPITAL_COMMUNITY): Payer: Medicare Other | Admitting: Physical Therapy

## 2019-09-10 DIAGNOSIS — D72829 Elevated white blood cell count, unspecified: Secondary | ICD-10-CM

## 2019-09-10 DIAGNOSIS — E440 Moderate protein-calorie malnutrition: Secondary | ICD-10-CM | POA: Insufficient documentation

## 2019-09-10 DIAGNOSIS — R799 Abnormal finding of blood chemistry, unspecified: Secondary | ICD-10-CM | POA: Insufficient documentation

## 2019-09-10 DIAGNOSIS — D508 Other iron deficiency anemias: Secondary | ICD-10-CM

## 2019-09-10 LAB — CBC WITH DIFFERENTIAL/PLATELET
Abs Immature Granulocytes: 0.27 10*3/uL — ABNORMAL HIGH (ref 0.00–0.07)
Basophils Absolute: 0 10*3/uL (ref 0.0–0.1)
Basophils Relative: 0 %
Eosinophils Absolute: 0.1 10*3/uL (ref 0.0–0.5)
Eosinophils Relative: 0 %
HCT: 38.4 % (ref 36.0–46.0)
Hemoglobin: 11.5 g/dL — ABNORMAL LOW (ref 12.0–15.0)
Immature Granulocytes: 0 %
Lymphocytes Relative: 92 %
Lymphs Abs: 98.4 10*3/uL — ABNORMAL HIGH (ref 0.7–4.0)
MCH: 25.6 pg — ABNORMAL LOW (ref 26.0–34.0)
MCHC: 29.9 g/dL — ABNORMAL LOW (ref 30.0–36.0)
MCV: 85.5 fL (ref 80.0–100.0)
Monocytes Absolute: 2.1 10*3/uL — ABNORMAL HIGH (ref 0.1–1.0)
Monocytes Relative: 2 %
Neutro Abs: 6.8 10*3/uL (ref 1.7–7.7)
Neutrophils Relative %: 6 %
Platelets: 336 10*3/uL (ref 150–400)
RBC: 4.49 MIL/uL (ref 3.87–5.11)
RDW: 15.9 % — ABNORMAL HIGH (ref 11.5–15.5)
WBC: 107.7 10*3/uL (ref 4.0–10.5)
nRBC: 0 % (ref 0.0–0.2)

## 2019-09-10 LAB — COMPREHENSIVE METABOLIC PANEL
ALT: 16 U/L (ref 0–44)
AST: 17 U/L (ref 15–41)
Albumin: 3.7 g/dL (ref 3.5–5.0)
Alkaline Phosphatase: 66 U/L (ref 38–126)
Anion gap: 9 (ref 5–15)
BUN: 24 mg/dL — ABNORMAL HIGH (ref 8–23)
CO2: 25 mmol/L (ref 22–32)
Calcium: 9.3 mg/dL (ref 8.9–10.3)
Chloride: 106 mmol/L (ref 98–111)
Creatinine, Ser: 1.14 mg/dL — ABNORMAL HIGH (ref 0.44–1.00)
GFR calc Af Amer: 56 mL/min — ABNORMAL LOW (ref >60)
GFR calc non Af Amer: 48 mL/min — ABNORMAL LOW (ref >60)
Glucose, Bld: 114 mg/dL — ABNORMAL HIGH (ref 70–99)
Potassium: 4.3 mmol/L (ref 3.5–5.1)
Sodium: 140 mmol/L (ref 135–145)
Total Bilirubin: 0.9 mg/dL (ref 0.3–1.2)
Total Protein: 6.5 g/dL (ref 6.5–8.1)

## 2019-09-10 LAB — GLUCOSE, CAPILLARY
Glucose-Capillary: 91 mg/dL (ref 70–99)
Glucose-Capillary: 91 mg/dL (ref 70–99)
Glucose-Capillary: 125 mg/dL — ABNORMAL HIGH (ref 70–99)
Glucose-Capillary: 96 mg/dL (ref 70–99)

## 2019-09-10 MED ORDER — POLYETHYLENE GLYCOL 3350 17 G PO PACK: 17.0000 g | PACK | Freq: Two times a day (BID) | ORAL | 0 refills | Status: AC

## 2019-09-10 MED ORDER — ACETAMINOPHEN 325 MG PO TABS: 325.0000 mg | ORAL_TABLET | ORAL | Status: DC | PRN

## 2019-09-10 MED ORDER — METHADONE HCL 5 MG PO TABS
5.0000 mg | ORAL_TABLET | Freq: Every day | ORAL | Status: DC
  Administered 2019-09-11 – 2019-09-13 (×3): 5 mg via ORAL
  Filled 2019-09-10 (×3): qty 1

## 2019-09-10 NOTE — Progress Notes (Signed)
PHYSICAL MEDICINE & REHABILITATION PROGRESS NOTE  Subjective/Complaints: Patient seen laying in bed this morning.  She states she slept well overnight.  She states she was told she had a good first day of therapies yesterday, but states she did not notice much improvement.  Discussed expectations and anticipation of slow progress.  ROS: + Rectal pain.  Denies CP, SOB, N/V/D  Objective: Vital Signs: Blood pressure 94/71, pulse 69, temperature 98.2 F (36.8 C), temperature source Oral, resp. rate 18, weight 81.1 kg, SpO2 99 %. No results found. Recent Labs    09/10/19 0515  WBC 107.7*  HGB 11.5*  HCT 38.4  PLT 336   Recent Labs    09/08/19 0313 09/10/19 0515  NA 139 140  K 4.6 4.3  CL 105 106  CO2 24 25  GLUCOSE 133* 114*  BUN 22 24*  CREATININE 1.03* 1.14*  CALCIUM 8.9 9.3    Physical Exam: BP 94/71 (BP Location: Right Arm)   Pulse 69   Temp 98.2 F (36.8 C) (Oral)   Resp 18   Wt 81.1 kg   SpO2 99%   BMI 23.59 kg/m  Constitutional: No distress . Vital signs reviewed. HENT: Normocephalic.  Atraumatic. Eyes: EOMI. No discharge. Cardiovascular: No JVD. Respiratory: Normal effort.  No stridor. GI: Non-distended. Skin: Warm and dry.  Intact. Psych: Normal mood.  Normal behavior. Musc: No edema in extremities.  No tenderness in extremities. Neuro: Alert Motor: LUE/LLE: 5/5 proximal to distal, unchanged RUE: 4+/5 proximal to distal RLE: HF, KE 3+/5, ADF 0/5 Increased tone right ankle  Assessment/Plan: 1. 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  Physiatrist is providing close team supervision and 24 hour management of active medical problems listed below.  Physiatrist and rehab team continue to assess barriers to discharge/monitor patient progress toward functional and medical goals  Care Tool:  Bathing    Body parts bathed  by patient: Right arm, Left arm, Chest, Abdomen, Front perineal area, Right upper leg, Left upper leg, Face   Body parts bathed by helper: Buttocks, Right lower leg, Left lower leg     Bathing assist Assist Level: Moderate Assistance - Patient 50 - 74%     Upper Body Dressing/Undressing Upper body dressing   What is the patient wearing?: Pull over shirt    Upper body assist Assist Level: Minimal Assistance - Patient > 75%    Lower Body Dressing/Undressing Lower body dressing      What is the patient wearing?: Pants, Underwear/pull up     Lower body assist Assist for lower body dressing: Moderate Assistance - Patient 50 - 74%     Toileting Toileting    Toileting assist Assist for toileting: Maximal Assistance - Patient 25 - 49%     Transfers Chair/bed transfer  Transfers assist     Chair/bed transfer assist level: Maximal Assistance - Patient 25 - 49%     Locomotion Ambulation   Ambulation assist   Ambulation activity did not occur: Safety/medical concerns  Assist level: 2 helpers Assistive device: Other (comment)(rail in hall) Max distance: 30   Walk 10 feet activity   Assist  Walk 10 feet activity did not occur: Safety/medical concerns  Assist level: 2 helpers Assistive device: Other (comment)(rail in hall)   Walk 50 feet activity   Assist Walk 50 feet with 2 turns activity did not occur: Safety/medical concerns  Walk 150 feet activity   Assist Walk 150 feet activity did not occur: Safety/medical concerns         Walk 10 feet on uneven surface  activity   Assist Walk 10 feet on uneven surfaces activity did not occur: Safety/medical concerns         Wheelchair     Assist   Type of Wheelchair: Manual    Wheelchair assist level: Moderate Assistance - Patient 50 - 74% Max wheelchair distance: 50    Wheelchair 50 feet with 2 turns activity    Assist        Assist Level: Moderate Assistance - Patient 50 -  74%   Wheelchair 150 feet activity     Assist Wheelchair 150 feet activity did not occur: Safety/medical concerns          Medical Problem List and Plan:  1. 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 Antithrombotics:   -DVT/anticoagulation: Pharmaceutical: Lovenox   -antiplatelet therapy: n/a  3. Chronic pudendal pain/Pain Management: cymbalta with Continue methadone bid   Monitor with increased mobility  ECG 12/31 reviewed -repeat ECG personally reviewed-QTC within acceptable limits  Will consider further medication adjustments if necessary 4. Mood: LCSW to follow for evaluation and support.   -antipsychotic agents: N/A  5. Neuropsych: This patient is ? fully capable of making decisions on her own behalf.  6. Skin/Wound Care: Routine pressure relief measures.  7. 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 Improved.   Cr 1.14 on 1/7, with elevated BUN, labs ordered for tomorrow  Encourage fluids  Cont to monitor 9. Hypothyroid: Resume supplement.  10. 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  Recommendations to reduce decadron to 4 mg at discharge, will follow up on frequency.   On Keppra and decadron-->tapered to bid on 1/5  12. Steroid induced hyperglycemia: Hgb A1c-5.4.   Labile on 1/7  Monitor with increased mobility 13. OIC: On multiple medications--Laculose, Linzess, Miralax and senna  Increase bowel meds as necessary  Improving 14. Breast cancer in remission/CLL being observed:   WBCs 107.7 on 1/7-critical value and trending up, will discuss with hematology/oncology.  Labs ordered for tomorrow  Afebrile  Cont to monitor 14. Iron deficiently anemia: Added iron supplement   Hb 11.5 on 1/7  Cont to monitor 15. Spasticity/contracture formation of R ankle  Will consider Baclofen  if necessary  PRAFO  LOS: 1 days A FACE TO FACE EVALUATION WAS PERFORMED  Renee [name] Renee [name] 09/10/2019, 8:43 AM

## 2019-09-10 NOTE — Progress Notes (Signed)
Reached out to Dr. Mickeal Skinner who recommended getting input from Hematology. Discussed CBC results with Dr. Alen Blew who felt that rise up to 107 is to be expected as patient on steriods in setting of CLL. It is of not of concern if it continues to rise slowly and there is no cutoff to raise concern for intervention as other cell lines are stable. It can be followed along for now.

## 2019-09-10 NOTE — Progress Notes (Signed)
Occupational Therapy Session Note  Patient Details  Name: Renee [name] MRN: [id number] Date of Birth: [birth year]  Today's Date: 09/10/2019 OT Individual Time: 0900-1000 OT Individual Time Calculation (min): 60 min    Short Term Goals: Week 1:  OT Short Term Goal 1 (Week 1): Pt will don shirt with (S) OT Short Term Goal 2 (Week 1): Pt will demonstrate improved RUE proprioception by having 75% reaching accuracy OT Short Term Goal 3 (Week 1): Pt will don pants with min A OT Short Term Goal 4 (Week 1): Pt will transfer to [facility] with mod A  Skilled Therapeutic Interventions/Progress Updates:    Pt received in bed and agreeable to therapy. Focused on visual attention to R arm and leg, placement of extremities ( ie holding R arm with L during bed mobility), and slow controlled movement patterns.  Sat to EOB with mod A and then needed max A to maintain sitting , worked on alignment/ awareness of trunk and then pt able to sit with CLOSE S statically. Attempted squat pivot transfer 2x, stand pivot 1x but each time it was not safe as she could not maintain R foot on floor as it would kick out into extension. Used stedy, in which pt can then pull up with mod A.  She gets anxious about how the pads of the stedy feel to her and will try to push R foot out more which ends up putting more pressure on shin.  Transferred to [facility] over toilet. Pt distracted by her discomfort in her bottom. Had a large bowel movement, total A clean up.   Returned to bed to dress as she felt she could not tolerate sitting in the w/c. Mod to max A due to severe incoordination and sensory awareness of R arm and leg.   Pt resting in bed with all needs met. Alarm set.     Therapy Documentation Precautions:  Precautions Precautions: Fall Precaution Comments: Significant weakness at RLE, impaired R UE proprioception Restrictions Weight Bearing Restrictions: No   Pain: Pain Assessment Pain Score: 7  Pain Type: Chronic pain Pain  Location: Rectum Pain Descriptors / Indicators: Aching Pain Onset: On-going Pain Intervention(s): Repositioned   Therapy/Group: Individual Therapy  [place] 09/10/2019, 12:57 PM

## 2019-09-10 NOTE — Progress Notes (Addendum)
Physical Therapy Session Note  Patient Details  Name: Renee [name] MRN: [id number] Date of Birth: [birth year]  Today's Date: 09/10/2019 PT Individual Time: [id number]   PT Individual Time Calculation (min): 62 min  And Today's Date: 09/10/2019 PT Missed Time: 60 Minutes Missed Time Reason: Pain  Short Term Goals: Week 1:  PT Short Term Goal 1 (Week 1): pt will performe bed mobility with mod assist PT Short Term Goal 2 (Week 1): Pt will consistently transfer to [facility] with mod assist PT Short Term Goal 3 (Week 1): Pt will ambulate 19ft with mod assist of 1 person. PT Short Term Goal 4 (Week 1): pt will propell WC 151ft with min assist from PT  Skilled Therapeutic Interventions/Progress Updates:    Session 1: Pt received supine in bed requesting to use bathroom and agreeable to therapy session. Supine>sit, HOB half elevated and using bedrail, with min assist for trunk upright and R LE management. Sit<>stand using stedy with min assist - assist for R LE positioning - pt noted to have significant R hip abduction weakness resulting in R knee falling inward upon returning to sit. Transported in/out of bathroom in stedy. Standing in stedy with CGA for steadying therapist performed total assist LB clothing management. Pt continent of bowels and therapist performed total assist peri-care in sitting.  Transported to/from gym in w/c. R squat pivot w/c>EOM with max assist for lifting and pivoting hips and max cuing for sequencing to increase pt participation. Sit>supine with mod assist for R LE management and trunk descent.  In supine performed the following exercises targeting R LE NMR for motor control:  - heel slides 2sets of 7 reps with cuing and manual facilitation for increased knee flexion and then eccentric control going into knee extension  - bridging 2 sets of 7 reps with cuing and manual facilitation for increased R LE weightbearing, increased R hip extension, and to avoid knee falling in/out during the  movement Pt reporting onset of significant rectal pain, reporting this is a chronic issue and she sees a pain clinic for management. Allowed therapeutic rest break in L sidelying with cool washcloth to forehead - pt with minimal to no relief. Supine>sit with mod assist for R LE management and trunk upright. L squat pivot EOM>w/c with max assist for lifting/pivoting hips. Transported back to room. L squat pivot w/c>EOB using B UE support on bedrail and mod assist for lifting/pivoting hips. Sit>supine with mod assist for eccentric control and LE management. Pt therapeutically positioned in L sidelying for pain management with call bell in reach and bed alarm on. Therapist spoke with pt's RN and PA regarding pt's significant rectal pain and limitations on therapy; PA aware.  Session 2: Pt received L sidelying in bed reporting that her pain has not been alleviated at all since the end of prior therapy session. Reports having received available pain medication since prior therapy session but cannot find any relief at this time. RN notified of pt's continued pain and inability to participate in therapy session. Missed 60 minutes of skilled physical therapy.   Therapy Documentation Precautions:  Precautions Precautions: Fall Precaution Comments: Significant weakness at RLE, impaired R UE proprioception Restrictions Weight Bearing Restrictions: No  Pain: Session 1: Significant, unrated, rectal pain (chronic pain) with no alleviation except for pain medication, which pt reports is not due until 5pm.  Therapy/Group: Individual Therapy  Tawana Scale, PT, DPT 09/10/2019, 1:22 PM

## 2019-09-11 ENCOUNTER — Inpatient Hospital Stay (HOSPITAL_COMMUNITY): Payer: Medicare Other | Admitting: Occupational Therapy

## 2019-09-11 ENCOUNTER — Inpatient Hospital Stay (HOSPITAL_COMMUNITY): Payer: Medicare Other | Admitting: Physical Therapy

## 2019-09-11 DIAGNOSIS — R531 Weakness: Secondary | ICD-10-CM

## 2019-09-11 DIAGNOSIS — N182 Chronic kidney disease, stage 2 (mild): Secondary | ICD-10-CM

## 2019-09-11 DIAGNOSIS — D72820 Lymphocytosis (symptomatic): Secondary | ICD-10-CM

## 2019-09-11 LAB — CBC WITH DIFFERENTIAL/PLATELET
Abs Immature Granulocytes: 0.26 10*3/uL — ABNORMAL HIGH (ref 0.00–0.07)
Basophils Absolute: 0.2 10*3/uL — ABNORMAL HIGH (ref 0.0–0.1)
Basophils Relative: 0 %
Eosinophils Absolute: 0.1 10*3/uL (ref 0.0–0.5)
Eosinophils Relative: 0 %
HCT: 40.7 % (ref 36.0–46.0)
Hemoglobin: 12.2 g/dL (ref 12.0–15.0)
Immature Granulocytes: 0 %
Lymphocytes Relative: 91 %
Lymphs Abs: 105.5 10*3/uL — ABNORMAL HIGH (ref 0.7–4.0)
MCH: 25.8 pg — ABNORMAL LOW (ref 26.0–34.0)
MCHC: 30 g/dL (ref 30.0–36.0)
MCV: 86 fL (ref 80.0–100.0)
Monocytes Absolute: 2.8 10*3/uL — ABNORMAL HIGH (ref 0.1–1.0)
Monocytes Relative: 2 %
Neutro Abs: 7.5 10*3/uL (ref 1.7–7.7)
Neutrophils Relative %: 7 %
Platelets: 296 10*3/uL (ref 150–400)
RBC: 4.73 MIL/uL (ref 3.87–5.11)
RDW: 15.9 % — ABNORMAL HIGH (ref 11.5–15.5)
WBC: 116.3 10*3/uL (ref 4.0–10.5)
nRBC: 0 % (ref 0.0–0.2)

## 2019-09-11 LAB — BASIC METABOLIC PANEL
Anion gap: 7 (ref 5–15)
BUN: 19 mg/dL (ref 8–23)
CO2: 24 mmol/L (ref 22–32)
Calcium: 9 mg/dL (ref 8.9–10.3)
Chloride: 106 mmol/L (ref 98–111)
Creatinine, Ser: 0.99 mg/dL (ref 0.44–1.00)
GFR calc Af Amer: >60 mL/min (ref >60)
GFR calc non Af Amer: 57 mL/min — ABNORMAL LOW (ref >60)
Glucose, Bld: 99 mg/dL (ref 70–99)
Potassium: 4.7 mmol/L (ref 3.5–5.1)
Sodium: 137 mmol/L (ref 135–145)

## 2019-09-11 LAB — GLUCOSE, CAPILLARY
Glucose-Capillary: 91 mg/dL (ref 70–99)
Glucose-Capillary: 110 mg/dL — ABNORMAL HIGH (ref 70–99)
Glucose-Capillary: 114 mg/dL — ABNORMAL HIGH (ref 70–99)
Glucose-Capillary: 102 mg/dL — ABNORMAL HIGH (ref 70–99)

## 2019-09-11 MED ORDER — BACLOFEN 5 MG HALF TABLET
5.0000 mg | ORAL_TABLET | Freq: Three times a day (TID) | ORAL | Status: DC
  Administered 2019-09-11 – 2019-09-16 (×16): 5 mg via ORAL
  Filled 2019-09-11 (×16): qty 1

## 2019-09-11 NOTE — Progress Notes (Signed)
Occupational Therapy Session Note  Patient Details  Name: Renee [name] MRN: [id number] Date of Birth: 11-25-1945  Today's Date: 09/11/2019 OT Individual Time: 1110-1210 OT Individual Time Calculation (min): 60 min    Short Term Goals: Week 1:  OT Short Term Goal 1 (Week 1): Pt will don shirt with (S) OT Short Term Goal 2 (Week 1): Pt will demonstrate improved RUE proprioception by having 75% reaching accuracy OT Short Term Goal 3 (Week 1): Pt will don pants with min A OT Short Term Goal 4 (Week 1): Pt will transfer to [facility] with mod A  Skilled Therapeutic Interventions/Progress Updates:    Pt received in bed anxious to use the bathroom. Used the stedy to transfer pt from bed to [facility] over toilet with mod A.  She has a great deal of instability in R knee with it "popping" out when she sits down.  Pt did demonstrate improved midline postural control with cues to lean to L.   Pt toileted with max A.   She then was transferred to EOB so she could practice a squat pivot bed to wc to her L side with mod-max A.  She then was placed at sink to complete oral care and UB b/d. This therapist assisted pt with washing her hair at the sink with the shampoo tray and then drying her hair with hair dryer. Pt continually c/o rectum pain stating she could not tolerate sitting up in w/c.  Pt completed squat pivot back to bed to L with mod A using bed rail to Assist.  Mod to get adjusted in bed.  Pt with RN and bed alarm set.   Therapy Documentation Precautions:  Precautions Precautions: Fall Precaution Comments: Significant weakness at RLE, impaired R UE proprioception Restrictions Weight Bearing Restrictions: No    Vital Signs: Therapy Vitals Temp: 98.3 F (36.8 C) Pulse Rate: 77 Resp: 18 BP: (!) 105/58 Patient Position (if appropriate): Lying Oxygen Therapy SpO2: 98 % O2 Device: Room Air Pain:  chronic rectal pain, pt unable to tolerate sitting on cushion in wc for longer than 30 min.  RN provided  medication   Therapy/Group: Individual Therapy  Treyshon Buchanon 09/11/2019, 12:47 PM

## 2019-09-11 NOTE — Progress Notes (Signed)
Physical Therapy Session Note  Patient Details  Name: Renee [name] MRN: [id number] Date of Birth: 06/21/1946  Today's Date: 09/11/2019 PT Individual Time: 0802-0900 PT Individual Time Calculation (min): 58 min   Short Term Goals: Week 1:  PT Short Term Goal 1 (Week 1): pt will performe bed mobility with mod assist PT Short Term Goal 2 (Week 1): Pt will consistently transfer to [facility] with mod assist PT Short Term Goal 3 (Week 1): Pt will ambulate 39ft with mod assist of 1 person. PT Short Term Goal 4 (Week 1): pt will propell WC 183ft with min assist from PT  Skilled Therapeutic Interventions/Progress Updates:   Pt received supine in bed reporting that she is still having rectal pain although she had received medication this morning and it has lessened. Pt reports MD recommended she place ice to the area - RN arriving shortly after PT to apply ice to the area - left on for 20 minutes during session, no adverse effects. Pt agreeable to therapy session starting with bed level exercises while ice is donned.  Performed the following supine R LE exercises targeting NMR:  - ankle DF stretch 2x30seconds - heel slides 2sets of 9 reps with cuing and intermittent assist for increased knee flexion - hip abduction 1set of 7 reps with cuing and assist to prevent hip flexor compensation Cuing and manual facilitation during exercises for proper form targeting correct muscle activation/motor patterns. Supine>sit, HOB flat and using bedrails, via logroll technique with min assist and cuing for R LE hip/knee flexion to improve pelvic rotation and cuing for UE placement to assist with trunk upright. Pt initially required min assist in sitting due to posterior lean. Performed sitting EOB dynamic trunk control task of reaching to external target with pelvic weight shifting - pt required intermittent min assist for balance. Sit>stand EOB>stedy with min assist for lifting. Performed repeated sit<>stands in steady at  mirror for visual feedback - 2 sets of 10 reps able to progress to last 5 with B UE support on knees and min/mod assist throughout for lifting with manual facilitation for R weight shift and preventing R knee adduction. Pillows placed in stedy seat and across leg portion for pain management. Pt reports increased pain and requesting to return to bed. Sit>supine with min assist for LE management. Pt requesting ice to rectum and L foot due to "tightness" she experiences after exercises. Pt therapeutically placed in L sidelying and therapist donned ice to both areas but after 1 minute pt requesting therapist remove the ice because she cannot tolerate the cold. Pt left resting in sidelying for pain management with needs in reach and bed alarm on.  Therapy Documentation Precautions:  Precautions Precautions: Fall Precaution Comments: Significant weakness at RLE, impaired R UE proprioception Restrictions Weight Bearing Restrictions: No  Pain:   Reports rectal pain (chronic issue) - ice applied to area for pain management, premedicated.  Reports L LE lateral foot pain near 5th metatarsal described as "tightness" from exercises - provided stretching.    Therapy/Group: Individual Therapy  Tawana Scale, PT, DPT 09/11/2019, 7:45 AM

## 2019-09-11 NOTE — Progress Notes (Signed)
[facility] PHYSICAL MEDICINE & REHABILITATION PROGRESS NOTE  Subjective/Complaints: Patient seen laying in bed this morning.  He states he slept fairly overnight, but woke up with rectal pain.  Discussed work-up and further with patient, who states it has been present for 10-12 years.  She is upset that it has not resolved after 1 day of rehab. She states she has not tried Lyrica before and states we could try that.  She has not tried icing either, encouraged patient to trial prior to therapies.  She states her daughter has questions for me.  Questions reviewed: #1 medication for breakthrough pain-additional dose of methadone added yesterday.  #2 medication complaining labia.  However, patient states that labia does not burn.  When asked if there is anything else she needed, patient states "she will not do what I want anyway".  When asked if she wants, patient states, "Nevermind, just do what he was going to do".  ROS: + Rectal pain.  Denies CP, SOB, N/V/D  Objective: Vital Signs: Blood pressure 125/69, pulse 76, temperature 97.9 F (36.6 C), resp. rate 18, weight 80.4 kg, SpO2 100 %. No results found. Recent Labs    09/10/19 0515 09/11/19 0648  WBC 107.7* PENDING  HGB 11.5* 12.2  HCT 38.4 40.7  PLT 336 296   Recent Labs    09/10/19 0515 09/11/19 0648  NA 140 137  K 4.3 4.7  CL 106 106  CO2 25 24  GLUCOSE 114* 99  BUN 24* 19  CREATININE 1.14* 0.99  CALCIUM 9.3 9.0    Physical Exam: BP 125/69 (BP Location: Left Arm)   Pulse 76   Temp 97.9 F (36.6 C)   Resp 18   Wt 80.4 kg   SpO2 100%   BMI 23.39 kg/m  Constitutional: No distress . Vital signs reviewed. HENT: Normocephalic.  Atraumatic. Eyes: EOMI. No discharge. Cardiovascular: No JVD. Respiratory: Normal effort.  No stridor. GI: Non-distended. Skin: Warm and dry.  Intact. Psych: Normal mood.  Normal behavior. Musc: No TTP left foot.  Neuro: Alert Motor:  RUE: 4+/5 proximal to distal RLE: HF, KE 3+/5, ADF 0/5,  unchanged Increased tone right ankle, unchanged  Assessment/Plan: 1. 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  Physiatrist is providing close team supervision and 24 hour management of active medical problems listed below.  Physiatrist and rehab team continue to assess barriers to discharge/monitor patient progress toward functional and medical goals  Care Tool:  Bathing    Body parts bathed by patient: Right arm, Left arm, Chest, Abdomen(UB only today)   Body parts bathed by helper: Buttocks, Right lower leg, Left lower leg     Bathing assist Assist Level: Minimal Assistance - Patient > 75%     Upper Body Dressing/Undressing Upper body dressing   What is the patient wearing?: Pull over shirt    Upper body assist Assist Level: Minimal Assistance - Patient > 75%    Lower Body Dressing/Undressing Lower body dressing      What is the patient wearing?: Pants, Underwear/pull up     Lower body assist Assist for lower body dressing: Minimal Assistance - Patient > 75%     Toileting Toileting    Toileting assist Assist for toileting: Moderate Assistance - Patient 50 - 74%(bedpan)     Transfers Chair/bed transfer  Transfers assist     Chair/bed transfer assist level: Maximal Assistance - Patient 25 -  49%(squat pivot)     Locomotion Ambulation   Ambulation assist   Ambulation activity did not occur: Safety/medical concerns  Assist level: 2 helpers Assistive device: Other (comment)(rail in hall) Max distance: 30   Walk 10 feet activity   Assist  Walk 10 feet activity did not occur: Safety/medical concerns  Assist level: 2 helpers Assistive device: Other (comment)(rail in hall)   Walk 50 feet activity   Assist Walk 50 feet with 2 turns activity did not occur: Safety/medical concerns         Walk 150 feet activity   Assist  Walk 150 feet activity did not occur: Safety/medical concerns         Walk 10 feet on uneven surface  activity   Assist Walk 10 feet on uneven surfaces activity did not occur: Safety/medical concerns         Wheelchair     Assist   Type of Wheelchair: Manual    Wheelchair assist level: Moderate Assistance - Patient 50 - 74% Max wheelchair distance: 50    Wheelchair 50 feet with 2 turns activity    Assist        Assist Level: Moderate Assistance - Patient 50 - 74%   Wheelchair 150 feet activity     Assist Wheelchair 150 feet activity did not occur: Safety/medical concerns          Medical Problem List and Plan:  1. 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 Antithrombotics:   -DVT/anticoagulation: Pharmaceutical: Lovenox   -antiplatelet therapy: n/a  3. Chronic pudendal pain/Pain Management: Cymbalta  Continue methadone bid, afternoon dose added  Monitor with increased mobility  ECG 12/31 reviewed -repeat ECG personally reviewed-QTC within acceptable limits, repeat ECG on 1/8 personally reviewed-QTC within acceptable range, will await official read  ?Rash allergy to Lyrica  Baclofen 5 TID started on 1/8  Will consider further medication adjustments if necessary 4. Mood: LCSW to follow for evaluation and support.   -antipsychotic agents: N/A  5. Neuropsych: This patient is ? fully capable of making decisions on her own behalf.  6. Skin/Wound Care: Routine pressure relief measures.  7. 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 Improved.   Cr within normal limits on 1/8  Encourage fluids  Cont to monitor 9. Hypothyroid: Resume supplement.  10. 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  Recommendations to reduce decadron to 4 mg at discharge, will follow up on  frequency.   On Keppra and decadron-->tapered to bid on 1/5  12. Steroid induced hyperglycemia: Hgb A1c-5.4.   Controlled on 1/8  Monitor with increased mobility 13. OIC: On multiple medications--Laculose, Linzess, Miralax and senna  Increase bowel meds as necessary  Improving with medications 14. 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  Discussed with Neuro/Onc and Heme/Onc.  Per Heme/Onc cont to monitor and no cause for concern unless acute spike.  Labs ordered for Monday   Afebrile  Cont to monitor 14. Iron deficiently anemia: Resolved  Added iron supplement   Hb 12.2 on 1/8  Cont to monitor 15. Spasticity/contracture formation of R ankle  Baclofen 5 3 times daily started on 1/8  PRAFO  LOS: 2 days A FACE TO FACE EVALUATION WAS PERFORMED  [name] 09/11/2019, 9:26 AM

## 2019-09-11 NOTE — Progress Notes (Signed)
Physical Therapy Session Note  Patient Details  Name: Renee [name] MRN: [id number] Date of Birth: [birth year]  Today's Date: 09/11/2019 PT Individual Time: 1530-1600 PT Individual Time Calculation (min): 30 min   Short Term Goals: Week 1:  PT Short Term Goal 1 (Week 1): pt will performe bed mobility with mod assist PT Short Term Goal 2 (Week 1): Pt will consistently transfer to [facility] with mod assist PT Short Term Goal 3 (Week 1): Pt will ambulate 90f with mod assist of 1 person. PT Short Term Goal 4 (Week 1): pt will propell WC 1020fwith min assist from PT  Skilled Therapeutic Interventions/Progress Updates:   Pt received supine in bed and initially refusing therapy due to rectal pain, but then agreeable to bed level therapy. Supine neuro re-red: SLR, hip/knee flexion/extension with manual resistance, clam shells each completed x 8 BLE with AAROM on the R with cues for use of visual feedback to improve neuromotor control. Pt declined to attempt bridges. UE NMR for shoulder flexion and horizontal abduction with emphasis on decreased speed with eccetric movement and use of visual feed back to improve control each completed x 6 with AAROM. Pt left in bed with call bell in reach and all needs met.           Therapy Documentation Precautions:  Precautions Precautions: Fall Precaution Comments: Significant weakness at RLE, impaired R UE proprioception Restrictions Weight Bearing Restrictions: No General: PT Amount of Missed Time (min): 30 Minutes PT Missed Treatment Reason: Patient unwilling to participate;Pain Vital Signs: Therapy Vitals Temp: 99.1 F (37.3 C) Temp Source: Oral Pulse Rate: 84 Resp: 18 BP: 123/64 Patient Position (if appropriate): Lying Oxygen Therapy SpO2: 99 % O2 Device: Room Air Pain:   10/10 rectum. Pt repositioned. RN aware.     Therapy/Group: Individual Therapy  AuLorie Phenix/04/2020, 4:09 PM

## 2019-09-11 NOTE — Progress Notes (Signed)
Pam, PA made aware of critical value WBC. Pt is in no acute distress. Vitals are at baseline. Continue plan of care. Gerald Stabs, RN

## 2019-09-12 ENCOUNTER — Inpatient Hospital Stay (HOSPITAL_COMMUNITY): Payer: Medicare Other | Admitting: Physical Therapy

## 2019-09-12 ENCOUNTER — Inpatient Hospital Stay (HOSPITAL_COMMUNITY): Payer: Medicare Other | Admitting: Occupational Therapy

## 2019-09-12 DIAGNOSIS — G9389 Other specified disorders of brain: Secondary | ICD-10-CM

## 2019-09-12 LAB — GLUCOSE, CAPILLARY
Glucose-Capillary: 91 mg/dL (ref 70–99)
Glucose-Capillary: 92 mg/dL (ref 70–99)
Glucose-Capillary: 97 mg/dL (ref 70–99)
Glucose-Capillary: 113 mg/dL — ABNORMAL HIGH (ref 70–99)

## 2019-09-12 MED ORDER — TRAZODONE HCL 50 MG PO TABS
25.0000 mg | ORAL_TABLET | Freq: Every evening | ORAL | Status: DC | PRN
  Administered 2019-09-12: 25 mg via ORAL
  Filled 2019-09-12: qty 1

## 2019-09-12 MED ORDER — HYDROCORTISONE ACETATE 25 MG RE SUPP
25.0000 mg | Freq: Two times a day (BID) | RECTAL | Status: DC
  Administered 2019-09-12 – 2019-09-30 (×34): 25 mg via RECTAL
  Filled 2019-09-12 (×40): qty 1

## 2019-09-12 NOTE — Progress Notes (Signed)
Occupational Therapy Session Note  Patient Details  Name: Renee [name] MRN: [id number] Date of Birth: 17-Apr-1946  Today's Date: 09/12/2019 OT Individual Time: [id number] Individual Time Calculation (min): 58 min   Short Term Goals: Week 1:  OT Short Term Goal 1 (Week 1): Pt will don shirt with (S) OT Short Term Goal 2 (Week 1): Pt will demonstrate improved RUE proprioception by having 75% reaching accuracy OT Short Term Goal 3 (Week 1): Pt will don pants with min A OT Short Term Goal 4 (Week 1): Pt will transfer to [facility] with mod A  Skilled Therapeutic Interventions/Progress Updates:    Pt greeted in [place], finishing up toileting with NT. She was premedicated for pain and agreeable to remain up in the w/c for tx. Very adamant that she wanted to return to bed at end of tx. Started with oral care and grooming tasks w/c level at sink. Vcs for increasing functional use of Rt, however pt easily frustrated by her ataxia and tended to mostly use her L UE. Transitioned to standing task in the gym with high-low table. Tried to problem solve with pt ways that she could stand without "pulling up" but pt very self limiting, stating she can only stand with Stedy because of her tall height. Pt returned to room and OT set her up with a tabletop task to work on [place] hand coordination. She was able to recreate designs with small peg board using her R UE with supervision assist for 90% of tasks. Pt did need assist from Lt to stabilize bottom pegs when stacking them. She also needed several rest breaks due to arm fatigue. Provided pt with 2 free reading books, as she's an avid reader. Discussed therapeutic ways to incorporate Rt hand, like stabilizing book or turning pages with Lt assist and modifications ("dog-earring" pages). Mod A for squat pivot>bed using the bedrail. Mod A for returning to bed and repositioning in sidelying for comfort and pain relief. Left her with all needs within reach and bed alarm set.    Therapy Documentation Precautions:  Precautions Precautions: Fall Precaution Comments: Significant weakness at RLE, impaired R UE proprioception Restrictions Weight Bearing Restrictions: No Vital Signs: Therapy Vitals Temp: 98.1 F (36.7 C) Pulse Rate: 77 Resp: 18 BP: (!) 102/59 Patient Position (if appropriate): Lying Oxygen Therapy SpO2: 97 % O2 Device: Room Air Pain: in rectum, repositioned pt at end of session to address. She also c/o L LE pain when sitting up, tried to prop L LE up on a stool during tx to address but pt stated this didn't help.   ADL:       Therapy/Group: Individual Therapy  Kasaundra Fahrney A Tyshae Stair 09/12/2019, 3:57 PM

## 2019-09-12 NOTE — Plan of Care (Signed)
  Problem: Consults Goal: RH GENERAL PATIENT EDUCATION Description: See Patient Education module for education specifics. Outcome: Progressing Goal: Nutrition Consult-if indicated Outcome: Progressing   Problem: RH BOWEL ELIMINATION Goal: RH STG MANAGE BOWEL WITH ASSISTANCE Description: STG Manage Bowel with mod Assistance.  Outcome: Progressing   Problem: RH BLADDER ELIMINATION Goal: RH STG MANAGE BLADDER WITH ASSISTANCE Description: STG Manage Bladder With mod Assistance Outcome: Progressing   Problem: RH SKIN INTEGRITY Goal: RH STG SKIN FREE OF INFECTION/BREAKDOWN Description: Patients skin will remain free from further infection or breakdown with min assist. Outcome: Progressing   Problem: RH SAFETY Goal: RH STG ADHERE TO SAFETY PRECAUTIONS W/ASSISTANCE/DEVICE Description: STG Adhere to Safety Precautions With min Assistance/Device. Outcome: Progressing   Problem: RH PAIN MANAGEMENT Goal: RH STG PAIN MANAGED AT OR BELOW PT'S PAIN GOAL Description: < 3 Outcome: Progressing

## 2019-09-12 NOTE — Progress Notes (Signed)
[facility] PHYSICAL MEDICINE & REHABILITATION PROGRESS NOTE  Subjective/Complaints:  Discussed rectal pain worsens with sitting and occurs spontaneously , no pain with BM  ROS: + Rectal pain.  Denies CP, SOB, N/V/D  Objective: Vital Signs: Blood pressure 124/74, pulse 74, temperature 98.4 F (36.9 C), resp. rate 19, weight 80.4 kg, SpO2 100 %. No results found. Recent Labs    09/10/19 0515 09/11/19 0648  WBC 107.7* 116.3*  HGB 11.5* 12.2  HCT 38.4 40.7  PLT 336 296   Recent Labs    09/10/19 0515 09/11/19 0648  NA 140 137  K 4.3 4.7  CL 106 106  CO2 25 24  GLUCOSE 114* 99  BUN 24* 19  CREATININE 1.14* 0.99  CALCIUM 9.3 9.0    Physical Exam: BP 124/74 (BP Location: Left Arm)   Pulse 74   Temp 98.4 F (36.9 C)   Resp 19   Wt 80.4 kg   SpO2 100%   BMI 23.39 kg/m  Constitutional: No distress . Vital signs reviewed. HENT: Normocephalic.  Atraumatic. Eyes: EOMI. No discharge. Cardiovascular: No JVD. Respiratory: Normal effort.  No stridor. GI: Non-distended. Skin: Warm and dry.  Intact. Psych: Normal mood.  Normal behavior. Musc: No TTP left foot.  Neuro: Alert Motor:  RUE: 4+/5 proximal to distal RLE: HF, KE 3+/5, ADF 0/5, unchanged Increased tone right ankle, unchanged  Assessment/Plan: 1. 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  Physiatrist is providing close team supervision and 24 hour management of active medical problems listed below.  Physiatrist and rehab team continue to assess barriers to discharge/monitor patient progress toward functional and medical goals  Care Tool:  Bathing    Body parts bathed by patient: Right arm, Left arm, Chest, Abdomen(UB only)   Body parts bathed by helper: Buttocks, Right lower leg, Left lower leg     Bathing assist Assist Level: Set up assist     Upper Body Dressing/Undressing Upper  body dressing   What is the patient wearing?: Pull over shirt    Upper body assist Assist Level: Minimal Assistance - Patient > 75%    Lower Body Dressing/Undressing Lower body dressing      What is the patient wearing?: Pants, Underwear/pull up     Lower body assist Assist for lower body dressing: Minimal Assistance - Patient > 75%     Toileting Toileting    Toileting assist Assist for toileting: Moderate Assistance - Patient 50 - 74%     Transfers Chair/bed transfer  Transfers assist     Chair/bed transfer assist level: Maximal Assistance - Patient 25 - 49%     Locomotion Ambulation   Ambulation assist   Ambulation activity did not occur: Safety/medical concerns  Assist level: 2 helpers Assistive device: Other (comment)(rail in hall) Max distance: 30   Walk 10 feet activity   Assist  Walk 10 feet activity did not occur: Safety/medical concerns  Assist level: 2 helpers Assistive device: Other (comment)(rail in hall)   Walk 50 feet activity   Assist Walk 50 feet with 2 turns activity did not occur: Safety/medical concerns         Walk 150 feet activity   Assist Walk 150 feet activity did not occur: Safety/medical concerns         Walk 10 feet on uneven surface  activity   Assist Walk 10 feet on uneven surfaces activity did not occur: Safety/medical concerns  Wheelchair     Assist   Type of Wheelchair: Manual    Wheelchair assist level: Moderate Assistance - Patient 50 - 74% Max wheelchair distance: 50    Wheelchair 50 feet with 2 turns activity    Assist        Assist Level: Moderate Assistance - Patient 50 - 74%   Wheelchair 150 feet activity     Assist Wheelchair 150 feet activity did not occur: Safety/medical concerns          Medical Problem List and Plan:  1. 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 Antithrombotics:   -DVT/anticoagulation: Pharmaceutical: Lovenox   -antiplatelet therapy: n/a  3. Chronic pudendal pain/Pain Management: Cymbalta- no ext hemorrhoids   Continue methadone bid, afternoon dose added  Monitor with increased mobility    ?Rash allergy to Lyrica  Baclofen 5 TID started on 1/8 Add anal supp HCT BID  Will consider further medication adjustments if necessary 4. Mood: LCSW to follow for evaluation and support.   -antipsychotic agents: N/A  5. Neuropsych: This patient is ? fully capable of making decisions on her own behalf.  6. Skin/Wound Care: Routine pressure relief measures.  7. 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 Improved.   Cr within normal limits on 1/8  Encourage fluids  Cont to monitor 9. Hypothyroid: Resume supplement.  10. 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  Recommendations to reduce decadron to 4 mg at discharge, will follow up on frequency.   On Keppra and decadron-->tapered to bid on 1/5  12. Steroid induced hyperglycemia: Hgb A1c-5.4.   Controlled on 1/8  Monitor with increased mobility 13. OIC: On multiple medications--Laculose, Linzess, Miralax and senna  Increase bowel meds as necessary  Improving with medications 14. 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  Discussed with Neuro/Onc and Heme/Onc.  Per Heme/Onc cont to monitor and no cause for concern unless acute spike.  Labs ordered for Monday   Afebrile  Cont to monitor 14. Iron deficiently anemia: Resolved  Added iron supplement   Hb 12.2 on 1/8  Cont to monitor 15. Spasticity/contracture formation of R ankle  Baclofen 5 3 times daily started on 1/8  PRAFO 16.  Leukocytosis due to CLL (91% Lymphocytes on diff)  LOS: 3 days A FACE TO FACE EVALUATION WAS PERFORMED  Charlett Blake 09/12/2019, 11:02 AM

## 2019-09-12 NOTE — Progress Notes (Signed)
Physical Therapy Session Note  Patient Details  Name: Renee [name] MRN: [id number] Date of Birth: 12/12/1945  Today's Date: 09/12/2019 PT Individual Time: [id number] and [id number] PT Individual Time Calculation (min): 56 min  And 47 min and  Today's Date: 09/12/2019 PT Missed Time: 13 Minutes  Missed Time Reason: Pain  Short Term Goals: Week 1:  PT Short Term Goal 1 (Week 1): pt will performe bed mobility with mod assist PT Short Term Goal 2 (Week 1): Pt will consistently transfer to [facility] with mod assist PT Short Term Goal 3 (Week 1): Pt will ambulate 44ft with mod assist of 1 person. PT Short Term Goal 4 (Week 1): pt will propell WC 129ft with min assist from PT  Skilled Therapeutic Interventions/Progress Updates:    Session 1: Pt received supine in bed reporting her rectum is hurting but she recently received medication and has had ice applied this AM - pt agreeable to therapy session starting with bed level exercises. Therapist performed R ankle DF stretch 2x30second hold. Performed R LE NMR targeting increased hip/knee flexion muscle activation x9 reps with pt requiring assist to perform the movement this session. Pt agreeable to get OOB and go to therapy gym. Supine>sit with mod assist for trunk upright and B LE management - cuing for logroll technique to increase pt independence. Donned shoes sitting EOB with close supervision for sitting balance and max assist for shoes. R squat pivot EOB>w/c with mod assist for lifting/pivoting hips and cuing for head/hips relationship and UE positioning - had pt hold onto therapist with R arm due to poor proprioceptive awareness.  Transported to/from gym in w/c. In // bars with B UE support on // bars pt able to come to standing with min assist for lifting and return to sitting with mod assist for eccentric control - therapist blocking R knee buckling. Performed static standing with B UE support on // bar targeting improved R knee control to prevent buckling  and avoid hyperextension with pt demonstrating forward anterior trunk lean when performing R knee extension. Progressed to repeated stand<>squat position with B UE support on // bars progressed to L hand on therapist's shoulder with mod assist for balance due to anterior trunk lean and therapist blocking R knee buckling and preventing hyperextension - 3 sets to pt fatigue - max multimodal cuing for improved R knee control. Progressed to pre-gait training of stepping R LE forward/backwards with pt requiring mod assist for balance and max/total assist for R LE management - multimodal cuing for L lateral weight shifting to improve ability to lift R foot. Performed pre-gait training of stepping L LE forward/backwards with therapist providing min assist for balance and mod/max assist for R knee control to prevent buckling and hyperextension. Transported back to room and pt wanting to stay in w/c for short duration rather than returning to bed at this time - left with needs in reach and chair alarm on.   Session 2: Pt received supine in bed with her daughter present and pt agreeable to therapy session despite reporting rectal pain. Supine>sit, HOB partially elevated and using bedrail, with min assist for B LE management and cuing for trunk upright with pt using L UE for support. Sitting EOB with L UE support for trunk control, therapist donned shoes max assist. R squat pivot EOB>w/c, with mod assist for lifting/pivoting hips, pt holding R hand on therapist due to poor proprioceptive awareness and cuing for sequencing. Transported to/from gym in w/c. Performed B LE NMR reciprocal  movement using kinetron from sitting in w/c against 50cm/sec resistance for 2 minutes then 42min30second with cuing for full R LE extension to get foot pedal to floor with pt requiring assist. Pt reporting increasing rectal pain but agreeable to continue session at this time. Seated R LE NMR of placing R foot on/off 2" step with pt requiring  manual facilitation/assist for lifting foot x12 reps. Continues to report increasing pain and pt agreeable to transition to standing tasks for repositioning. Sit>stand in // bars with min assist for lifting and mod assist for eccentric control lowering. While standing in // bars with B UE support on bars performed static standing targeting midline orientation due to pt having L lateral weight shift and therapist providing R knee control to prevent buckling or hyperextension - progressed to performing with pt's L hand supported on therapists's shoulder. Progressed to performing standing minisquats with pt's L hand on therapist's shoulder and mod assist for balance and max/total assist for R knee control to prevent buckling/hyperextension - max multimodal cuing throughout for midline orientation and proper form for increased R knee control. Pt reporting significant increase in pain and requesting to return to bed. Transported back to room. L squat pivot w/c>EOB with min/mod assist for lifting/pivoting hips, again pt holding onto therapist with R hand due to poor proprioception and cuing for sequencing. Sit>supine with min assist for B LE management. Pt therapeutically positioned in L sidelying with pillow support for R hemibody. Therapist discussed D/C plan with pt and her daughter, Renee [name], regarding their plan for 24hr hired caregivers, pt already having a full ramp entrance into home, and pt's need to have a plan for transportation to/from MD appointments in [place]. Pt left sidelying in bed with needs in reach and bed alarm on and RN notified for medication administration.   Therapy Documentation Precautions:  Precautions Precautions: Fall Precaution Comments: Significant weakness at RLE, impaired R UE proprioception Restrictions Weight Bearing Restrictions: No  Pain: Session 1: Pt continues to report rectal pain - premedicated and nursing had applied ice this AM - therapist provided rest breaks,  distraction, and repositioning for pain management during session.   Session 2: Reports increasing rectal pain with activity - provided rest breaks, repositioning, and notified RN at end of session for medication administration.    Therapy/Group: Individual Therapy  Tawana Scale, PT, DPT 09/12/2019, 7:44 AM

## 2019-09-13 LAB — GLUCOSE, CAPILLARY
Glucose-Capillary: 98 mg/dL (ref 70–99)
Glucose-Capillary: 98 mg/dL (ref 70–99)
Glucose-Capillary: 121 mg/dL — ABNORMAL HIGH (ref 70–99)
Glucose-Capillary: 94 mg/dL (ref 70–99)

## 2019-09-13 MED ORDER — OXYBUTYNIN CHLORIDE 5 MG PO TABS
5.0000 mg | ORAL_TABLET | Freq: Every day | ORAL | Status: DC
  Administered 2019-09-13 – 2019-09-24 (×12): 5 mg via ORAL
  Filled 2019-09-13 (×12): qty 1

## 2019-09-13 MED ORDER — WITCH HAZEL-GLYCERIN EX PADS
MEDICATED_PAD | Freq: Three times a day (TID) | CUTANEOUS | Status: DC
  Administered 2019-09-17 – 2019-09-26 (×7): 1 via TOPICAL
  Filled 2019-09-13 (×3): qty 100

## 2019-09-13 NOTE — Progress Notes (Signed)
PHYSICAL MEDICINE & REHABILITATION PROGRESS NOTE  Subjective/Complaints:  Discussed rectal pain gets one hr relief from anusol supp, states that rectal pain is chronic Poor sleep related to freq voiding also c/o mushy stools each time she voids  ROS: + Rectal pain.  Denies CP, SOB, N/V/D  Objective: Vital Signs: Blood pressure 104/79, pulse 74, temperature 98.1 F (36.7 C), resp. rate 16, weight 77.5 kg, SpO2 100 %. No results found. Recent Labs    09/11/19 0648  WBC 116.3*  HGB 12.2  HCT 40.7  PLT 296   Recent Labs    09/11/19 0648  NA 137  K 4.7  CL 106  CO2 24  GLUCOSE 99  BUN 19  CREATININE 0.99  CALCIUM 9.0    Physical Exam: BP 104/79 (BP Location: Right Arm)   Pulse 74   Temp 98.1 F (36.7 C)   Resp 16   Wt 77.5 kg   SpO2 100%   BMI 22.54 kg/m  Constitutional: No distress . Vital signs reviewed. HENT: Normocephalic.  Atraumatic. Eyes: EOMI. No discharge. Cardiovascular: No JVD. Respiratory: Normal effort.  No stridor. GI: Non-distended. Rectal- no ext hemorrhoids  Skin: Warm and dry.  Intact. Psych: Normal mood.  Normal behavior. Musc: No TTP left foot.  Neuro: Alert Motor:  RUE: 4+/5 proximal to distal RLE: HF, KE 3+/5, ADF 0/5, unchanged Increased tone right ankle, unchanged  Assessment/Plan: 1. 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  Physiatrist is providing close team supervision and 24 hour management of active medical problems listed below.  Physiatrist and rehab team continue to assess barriers to discharge/monitor patient progress toward functional and medical goals  Care Tool:  Bathing  Bathing activity did not occur: Refused Body parts bathed by patient: Right arm, Left arm, Chest, Abdomen(UB only)   Body parts bathed by helper: Buttocks, Right lower leg, Left lower leg     Bathing assist  Assist Level: Set up assist     Upper Body Dressing/Undressing Upper body dressing   What is the patient wearing?: Pull over shirt    Upper body assist Assist Level: Minimal Assistance - Patient > 75%    Lower Body Dressing/Undressing Lower body dressing      What is the patient wearing?: Pants, Underwear/pull up     Lower body assist Assist for lower body dressing: Minimal Assistance - Patient > 75%     Toileting Toileting    Toileting assist Assist for toileting: Moderate Assistance - Patient 50 - 74%     Transfers Chair/bed transfer  Transfers assist     Chair/bed transfer assist level: Moderate Assistance - Patient 50 - 74%     Locomotion Ambulation   Ambulation assist   Ambulation activity did not occur: Safety/medical concerns  Assist level: 2 helpers Assistive device: Other (comment)(rail in hall) Max distance: 30   Walk 10 feet activity   Assist  Walk 10 feet activity did not occur: Safety/medical concerns  Assist level: 2 helpers Assistive device: Other (comment)(rail in hall)   Walk 50 feet activity   Assist Walk 50 feet with 2 turns activity did not occur: Safety/medical concerns         Walk 150 feet activity   Assist Walk 150 feet activity did not occur: Safety/medical concerns         Walk 10 feet on uneven surface  activity   Assist Walk 10 feet  on uneven surfaces activity did not occur: Safety/medical concerns         Wheelchair     Assist   Type of Wheelchair: Manual    Wheelchair assist level: Moderate Assistance - Patient 50 - 74% Max wheelchair distance: 50    Wheelchair 50 feet with 2 turns activity    Assist        Assist Level: Moderate Assistance - Patient 50 - 74%   Wheelchair 150 feet activity     Assist Wheelchair 150 feet activity did not occur: Safety/medical concerns          Medical Problem List and Plan:  1. 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 Antithrombotics:   -DVT/anticoagulation: Pharmaceutical: Lovenox   -antiplatelet therapy: n/a  3. Chronic pudendal pain/Pain Management: Cymbalta- no ext hemorrhoids   Continue methadone bid, afternoon dose added  Monitor with increased mobility    ?Rash allergy to Lyrica- now d/ced  Baclofen 5 TID started on 1/8 Add anusol supp HCT BID Trial of Tucks pad to anal area QID , other consideration would be lidocaine cream   Will consider further medication adjustments if necessary 4. Mood: LCSW to follow for evaluation and support.   -antipsychotic agents: N/A  5. Neuropsych: This patient is ? fully capable of making decisions on her own behalf.  6. Skin/Wound Care: Routine pressure relief measures.  7. 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 Improved.   Cr within normal limits on 1/8  Encourage fluids  Cont to monitor 9. Hypothyroid: Resume supplement.  10. 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  Recommendations to reduce decadron to 4 mg at discharge, will follow up on frequency.   On Keppra and decadron-->tapered to bid on 1/5  12. Steroid induced hyperglycemia: Hgb A1c-5.4.   Controlled on 1/8  Monitor with increased mobility 13. OIC: On multiple medications--Laculose, Linzess, Miralax and senna  Increase bowel meds as necessary  Improving with medications- stool freq increased will change miralax to prn 14. 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  Discussed with Neuro/Onc and Heme/Onc.  Per Heme/Onc cont to monitor and no cause for concern unless acute spike.  Labs ordered for Monday   Afebrile  Cont to monitor 14. Iron deficiently anemia: Resolved  Added iron supplement   Hb 12.2 on 1/8  Cont to monitor 15. Spasticity/contracture  formation of R ankle  Baclofen 5 3 times daily started on 1/8  PRAFO 16.  Leukocytosis due to CLL (91% Lymphocytes on diff)  16.  Spastic bladder interfering with sleep will add ditropan qhs  LOS: 4 days A FACE TO FACE EVALUATION WAS PERFORMED  Charlett Blake 09/13/2019, 10:29 AM

## 2019-09-13 NOTE — Plan of Care (Signed)
  Problem: Consults Goal: RH GENERAL PATIENT EDUCATION Description: See Patient Education module for education specifics. Outcome: Progressing Goal: Nutrition Consult-if indicated Outcome: Progressing   Problem: RH BOWEL ELIMINATION Goal: RH STG MANAGE BOWEL WITH ASSISTANCE Description: STG Manage Bowel with mod Assistance.  Outcome: Progressing   Problem: RH BLADDER ELIMINATION Goal: RH STG MANAGE BLADDER WITH ASSISTANCE Description: STG Manage Bladder With mod Assistance Outcome: Progressing   Problem: RH SKIN INTEGRITY Goal: RH STG SKIN FREE OF INFECTION/BREAKDOWN Description: Patients skin will remain free from further infection or breakdown with min assist. Outcome: Progressing   Problem: RH SAFETY Goal: RH STG ADHERE TO SAFETY PRECAUTIONS W/ASSISTANCE/DEVICE Description: STG Adhere to Safety Precautions With min Assistance/Device. Outcome: Progressing   Problem: RH PAIN MANAGEMENT Goal: RH STG PAIN MANAGED AT OR BELOW PT'S PAIN GOAL Description: < 3 Outcome: Progressing

## 2019-09-14 ENCOUNTER — Inpatient Hospital Stay (HOSPITAL_COMMUNITY): Payer: Medicare Other | Admitting: Physical Therapy

## 2019-09-14 ENCOUNTER — Inpatient Hospital Stay (HOSPITAL_COMMUNITY): Payer: Medicare Other | Admitting: Occupational Therapy

## 2019-09-14 ENCOUNTER — Other Ambulatory Visit: Payer: Self-pay | Admitting: *Deleted

## 2019-09-14 ENCOUNTER — Other Ambulatory Visit: Payer: Self-pay | Admitting: Internal Medicine

## 2019-09-14 DIAGNOSIS — N319 Neuromuscular dysfunction of bladder, unspecified: Secondary | ICD-10-CM

## 2019-09-14 DIAGNOSIS — G9389 Other specified disorders of brain: Secondary | ICD-10-CM

## 2019-09-14 LAB — CBC
HCT: 35.8 % — ABNORMAL LOW (ref 36.0–46.0)
Hemoglobin: 10.7 g/dL — ABNORMAL LOW (ref 12.0–15.0)
MCH: 25.8 pg — ABNORMAL LOW (ref 26.0–34.0)
MCHC: 29.9 g/dL — ABNORMAL LOW (ref 30.0–36.0)
MCV: 86.5 fL (ref 80.0–100.0)
Platelets: 265 10*3/uL (ref 150–400)
RBC: 4.14 MIL/uL (ref 3.87–5.11)
RDW: 15.9 % — ABNORMAL HIGH (ref 11.5–15.5)
WBC: 89.7 10*3/uL (ref 4.0–10.5)
nRBC: 0 % (ref 0.0–0.2)

## 2019-09-14 LAB — GLUCOSE, CAPILLARY
Glucose-Capillary: 97 mg/dL (ref 70–99)
Glucose-Capillary: 97 mg/dL (ref 70–99)
Glucose-Capillary: 137 mg/dL — ABNORMAL HIGH (ref 70–99)
Glucose-Capillary: 115 mg/dL — ABNORMAL HIGH (ref 70–99)

## 2019-09-14 LAB — BASIC METABOLIC PANEL
Anion gap: 6 (ref 5–15)
BUN: 17 mg/dL (ref 8–23)
CO2: 27 mmol/L (ref 22–32)
Calcium: 8.8 mg/dL — ABNORMAL LOW (ref 8.9–10.3)
Chloride: 105 mmol/L (ref 98–111)
Creatinine, Ser: 1.17 mg/dL — ABNORMAL HIGH (ref 0.44–1.00)
GFR calc Af Amer: 54 mL/min — ABNORMAL LOW (ref >60)
GFR calc non Af Amer: 47 mL/min — ABNORMAL LOW (ref >60)
Glucose, Bld: 104 mg/dL — ABNORMAL HIGH (ref 70–99)
Potassium: 4.7 mmol/L (ref 3.5–5.1)
Sodium: 138 mmol/L (ref 135–145)

## 2019-09-14 MED ORDER — METHADONE HCL 10 MG PO TABS
10.0000 mg | ORAL_TABLET | Freq: Every day | ORAL | Status: DC
  Administered 2019-09-14 – 2019-09-29 (×16): 10 mg via ORAL
  Filled 2019-09-14 (×16): qty 1

## 2019-09-14 MED ORDER — METHADONE HCL 10 MG PO TABS
10.0000 mg | ORAL_TABLET | Freq: Every day | ORAL | Status: DC
  Administered 2019-09-14 – 2019-09-30 (×17): 10 mg via ORAL
  Filled 2019-09-14 (×17): qty 1

## 2019-09-14 MED ORDER — METHADONE HCL 10 MG PO TABS
10.0000 mg | ORAL_TABLET | Freq: Every day | ORAL | Status: DC
  Administered 2019-09-15 – 2019-09-30 (×16): 10 mg via ORAL
  Filled 2019-09-14 (×16): qty 1

## 2019-09-14 MED ORDER — PREGABALIN 25 MG PO CAPS
25.0000 mg | ORAL_CAPSULE | Freq: Two times a day (BID) | ORAL | Status: DC
  Administered 2019-09-14 – 2019-09-18 (×9): 25 mg via ORAL
  Filled 2019-09-14 (×9): qty 1

## 2019-09-14 NOTE — Progress Notes (Signed)
Physical Therapy Session Note  Patient Details  Name: Renee [name] MRN: [id number] Date of Birth: 05-Jan-1946  Today's Date: 09/14/2019 PT Individual Time:  - 7 min     Short Term Goals: Week 2:     Skilled Therapeutic Interventions/Progress Updates: Patient presents supine and skeptical about participating w/ PT.  Patient began w/ LE there ex w/ AP (stretching of right ankle), HS w/ manual assist given for right hip flexion and manual resistance for hip/knee extension, abd/add and bridging w/ assist to maintain right LE in hooklying position.  Patient performed bridging w/ good hip clearance.  Patient performed bridging to move hips to side.  Log roll performed for sup to sit transfer to right.  Patient sat EOB w/ supervision and verbal cues for mid-line sitting while donning shoes.  Patient performed multiple sit to stand transfers during treatment session w/ mod to max A and verbal and visual cues for hand placement to decrease assistance.  Patient performed stepping pivot to w/c w/ RW and max A, w/ facilitation for weight shift to advance right footand to return to bed at conclusion.  Patient performed sit to stand in parallel bars w/ mod A and verbal cues for left hand placement on arm rest.  Patient performed weightshift and partial squats w/ approopriate body positioning to strengthen right LE w/ blocking of right knee as well as cushioning of right knee genu recurvatum.  Patient cued for eccentric movement of sitting to improve sitting.  Patient bed alarm returned and all needs w/in reach.       Therapy Documentation Precautions:  Precautions Precautions: Fall Precaution Comments: Significant weakness at RLE, impaired R UE proprioception Restrictions Weight Bearing Restrictions: No General:   Vital Signs:  Pain: 7/10 in rectum w/ meds given approx. At noon.        Therapy/Group: Individual Therapy  Ladoris Gene 09/14/2019, 2:20 PM

## 2019-09-14 NOTE — Progress Notes (Signed)
Patient needs to have 1 month MRI after discharge from Rehab and then follow up with Dr. Mickeal Skinner.  Pending order to be added and discharge to occur.

## 2019-09-14 NOTE — Progress Notes (Signed)
[facility] PHYSICAL MEDICINE & REHABILITATION PROGRESS NOTE  Subjective/Complaints: Patient seen laying in bed this morning.  She states she slept well overnight.  She states she had a good weekend.  She requests change in methadone dosing.  Discussed Lyrica again with patient who does not recall rash, but states no significant reaction.  ROS: + Rectal pain.  Denies CP, SOB, N/V/D  Objective: Vital Signs: Blood pressure (!) 110/56, pulse 69, temperature 98.6 F (37 C), temperature source Oral, resp. rate 16, weight 78.9 kg, SpO2 100 %. No results found. Recent Labs    09/14/19 0645  WBC 89.7*  HGB 10.7*  HCT 35.8*  PLT 265   Recent Labs    09/14/19 0645  NA 138  K 4.7  CL 105  CO2 27  GLUCOSE 104*  BUN 17  CREATININE 1.17*  CALCIUM 8.8*    Physical Exam: BP (!) 110/56 (BP Location: Left Arm)   Pulse 69   Temp 98.6 F (37 C) (Oral)   Resp 16   Wt 78.9 kg   SpO2 100%   BMI 22.95 kg/m  Constitutional: No distress . Vital signs reviewed. HENT: Normocephalic.  Atraumatic. Eyes: EOMI. No discharge. Cardiovascular: No JVD. Respiratory: Normal effort.  No stridor. GI: Non-distended. Skin: Warm and dry.  Intact. Psych: Normal mood.  Normal behavior. Musc: No edema in extremities.  No tenderness in extremities. Neuro: Alert Motor:  RUE: 4+/5 proximal to distal RLE: HF, KE 3 -/5, ADF 1/5 Increased tone right ankle, unchanged Sensation intact light touch throughout  Assessment/Plan: 1. 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  Physiatrist is providing close team supervision and 24 hour management of active medical problems listed below.  Physiatrist and rehab team continue to assess barriers to discharge/monitor patient progress toward functional and medical goals  Care Tool:  Bathing  Bathing activity did not occur: Refused Body parts  bathed by patient: Right arm, Left arm, Chest, Abdomen(UB only)   Body parts bathed by helper: Buttocks, Right lower leg, Left lower leg     Bathing assist Assist Level: Set up assist     Upper Body Dressing/Undressing Upper body dressing   What is the patient wearing?: Pull over shirt    Upper body assist Assist Level: Minimal Assistance - Patient > 75%    Lower Body Dressing/Undressing Lower body dressing      What is the patient wearing?: Pants, Underwear/pull up     Lower body assist Assist for lower body dressing: Minimal Assistance - Patient > 75%     Toileting Toileting    Toileting assist Assist for toileting: Moderate Assistance - Patient 50 - 74%     Transfers Chair/bed transfer  Transfers assist     Chair/bed transfer assist level: Moderate Assistance - Patient 50 - 74%     Locomotion Ambulation   Ambulation assist   Ambulation activity did not occur: Safety/medical concerns  Assist level: 2 helpers Assistive device: Other (comment)(rail in hall) Max distance: 30   Walk 10 feet activity   Assist  Walk 10 feet activity did not occur: Safety/medical concerns  Assist level: 2 helpers Assistive device: Other (comment)(rail in hall)   Walk 50 feet activity   Assist Walk 50 feet with 2 turns activity did not occur: Safety/medical concerns         Walk 150 feet activity   Assist Walk 150 feet activity did not occur:  Safety/medical concerns         Walk 10 feet on uneven surface  activity   Assist Walk 10 feet on uneven surfaces activity did not occur: Safety/medical concerns         Wheelchair     Assist   Type of Wheelchair: Manual    Wheelchair assist level: Moderate Assistance - Patient 50 - 74% Max wheelchair distance: 50    Wheelchair 50 feet with 2 turns activity    Assist        Assist Level: Moderate Assistance - Patient 50 - 74%   Wheelchair 150 feet activity     Assist Wheelchair 150  feet activity did not occur: Safety/medical concerns          Medical Problem List and Plan:  1. 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 Antithrombotics:   -DVT/anticoagulation: Pharmaceutical: Lovenox   -antiplatelet therapy: n/a  3. Chronic pudendal pain/Pain Management: Cymbalta- no ext hemorrhoids   Continue methadone bid, afternoon dose added, change dosing to 10 mg 3 times daily  Monitor with increased mobility  ?Rash allergy to Lyrica-we will trial low-dose  Baclofen 5 TID started on 1/8  Added anusol supp HCT BID  Trial of Tucks pad to anal area QID  Continue ice  Will consider further medication adjustments if necessary 4. Mood: LCSW to follow for evaluation and support.   -antipsychotic agents: N/A  5. Neuropsych: This patient is ? fully capable of making decisions on her own behalf.  6. Skin/Wound Care: Routine pressure relief measures.  7. 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 Improved.   Creatinine 1.17 on 1/11  Encourage fluids  Cont to monitor 9. Hypothyroid: Resume supplement.  10. 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  Recommendations to reduce decadron to 4 mg at discharge, will follow up on frequency.   On Keppra and decadron-->tapered to bid on 1/5  12. Steroid induced hyperglycemia: Hgb A1c-5.4.   Controlled on 1/11  Monitor with increased mobility 13. OIC: On multiple medications--Laculose, Linzess, Miralax and senna  Increase bowel meds as necessary  Improving 14. Breast cancer in remission/CLL being observed:   WBCs 89.7 on 1/11.  Discussed with Neuro/Onc and Heme/Onc.  Per Heme/Onc cont to monitor and no cause for concern unless acute spike.  Afebrile  Cont to monitor 14. Iron deficiently anemia: Resolved  Added iron supplement   Hb 10.7 on  1/11  Cont to monitor 15. Spasticity/contracture formation of R ankle  Baclofen 5 3 times daily started on 1/8  PRAFO 16.  Neurogenic bladder  Continue ditropan qhs   LOS: 5 days A FACE TO FACE EVALUATION WAS PERFORMED  Remee Charley Lorie Phenix 09/14/2019, 8:43 AM

## 2019-09-14 NOTE — Progress Notes (Signed)
Occupational Therapy Session Note  Patient Details  Name: Renee [name] MRN: [id number] Date of Birth: May 15, 1946  Today's Date: 09/14/2019 OT Individual Time: 3893-7342 OT Individual Time Calculation (min): 60 min    Short Term Goals: Week 1:  OT Short Term Goal 1 (Week 1): Pt will don shirt with (S) OT Short Term Goal 2 (Week 1): Pt will demonstrate improved RUE proprioception by having 75% reaching accuracy OT Short Term Goal 3 (Week 1): Pt will don pants with min A OT Short Term Goal 4 (Week 1): Pt will transfer to [facility] with mod A  Skilled Therapeutic Interventions/Progress Updates:    "oh no I just got back in bed!" Pt did want to bathe and change clothing but felt she could not tolerate sitting on a hard plastic tub bench. Another padded open seat tub bench not available for use during this session.  She also felt that she could not tolerate sitting in her wc so had her sit to EOB with min A.  Worked on sitting balance varying from close S to min A. Had pt use R hand to wt bear on bed and use RUE as a "kickstand" for support as she does not have control of her coordination of arm in open chain activities.    With closed chain, using R hand against L arm for washing pt is able to use RUE.  Set up UB bathing and min UB dressing to don shirt.  Had pt work on lateral leans vs sit to stand for safety to doff PJ pants.  Once she had completed washing thighs and lower legs to ankles she said she could not tolerate sitting any longer and flopped back into the bed.    Continued LB bathing for perineal area and buttocks using rolling and bridges. Max to don pants over feet and min over hips with bridging.    Discussed working on her tolerance for sitting as she also c/o 5/10 R shoulder pain just sitting. Pt stated it has been this way from admission.  Pt resting in bed with all needs met.   Therapy Documentation Precautions:  Precautions Precautions: Fall Precaution Comments: Significant  weakness at RLE, impaired R UE proprioception Restrictions Weight Bearing Restrictions: No  Pain: Pain Assessment Pain Scale: 0-10 Pain Score: 5  Pain Type: Chronic pain Pain Location: Rectum Pain Orientation: Mid Pain Descriptors / Indicators: Aching Pain Frequency: Constant Pain Onset: On-going Patients Stated Pain Goal: 2 Pain Intervention(s): Medication (See eMAR)  Therapy/Group: Individual Therapy  [place] 09/14/2019, 9:00 AM

## 2019-09-14 NOTE — Plan of Care (Signed)
  Problem: Consults Goal: RH GENERAL PATIENT EDUCATION Description: See Patient Education module for education specifics. Outcome: Progressing Goal: Nutrition Consult-if indicated Outcome: Progressing   Problem: RH BOWEL ELIMINATION Goal: RH STG MANAGE BOWEL WITH ASSISTANCE Description: STG Manage Bowel with mod Assistance.  Outcome: Progressing   Problem: RH BLADDER ELIMINATION Goal: RH STG MANAGE BLADDER WITH ASSISTANCE Description: STG Manage Bladder With mod Assistance Outcome: Progressing   Problem: RH SKIN INTEGRITY Goal: RH STG SKIN FREE OF INFECTION/BREAKDOWN Description: Patients skin will remain free from further infection or breakdown with min assist. Outcome: Progressing   Problem: RH SAFETY Goal: RH STG ADHERE TO SAFETY PRECAUTIONS W/ASSISTANCE/DEVICE Description: STG Adhere to Safety Precautions With min Assistance/Device. Outcome: Progressing   Problem: RH PAIN MANAGEMENT Goal: RH STG PAIN MANAGED AT OR BELOW PT'S PAIN GOAL Description: < 3 Outcome: Progressing

## 2019-09-15 ENCOUNTER — Inpatient Hospital Stay (HOSPITAL_COMMUNITY): Payer: Medicare Other | Admitting: Physical Therapy

## 2019-09-15 ENCOUNTER — Inpatient Hospital Stay (HOSPITAL_COMMUNITY): Payer: Medicare Other

## 2019-09-15 ENCOUNTER — Inpatient Hospital Stay (HOSPITAL_COMMUNITY): Payer: Medicare Other | Admitting: Occupational Therapy

## 2019-09-15 LAB — GLUCOSE, CAPILLARY
Glucose-Capillary: 88 mg/dL (ref 70–99)
Glucose-Capillary: 110 mg/dL — ABNORMAL HIGH (ref 70–99)
Glucose-Capillary: 117 mg/dL — ABNORMAL HIGH (ref 70–99)
Glucose-Capillary: 110 mg/dL — ABNORMAL HIGH (ref 70–99)

## 2019-09-15 LAB — JC VIRUS, PCR CSF: JC Virus PCR, CSF: POSITIVE — AB

## 2019-09-15 MED ORDER — MELATONIN 3 MG PO TABS
3.0000 mg | ORAL_TABLET | Freq: Every day | ORAL | Status: DC
  Administered 2019-09-15 – 2019-09-29 (×15): 3 mg via ORAL
  Filled 2019-09-15 (×17): qty 1

## 2019-09-15 MED ORDER — CLONAZEPAM 0.5 MG PO TABS: 1.0000 mg | ORAL_TABLET | Freq: Every day | ORAL | Status: DC

## 2019-09-15 NOTE — Progress Notes (Signed)
Occupational Therapy Session Note  Patient Details  Name: Renee [name] MRN: [id number] Date of Birth: April 04, 1946  Today's Date: 09/15/2019 OT Individual Time: 0930-1015 OT Individual Time Calculation (min): 45 min    Short Term Goals: Week 1:  OT Short Term Goal 1 (Week 1): Pt will don shirt with (S) OT Short Term Goal 2 (Week 1): Pt will demonstrate improved RUE proprioception by having 75% reaching accuracy OT Short Term Goal 3 (Week 1): Pt will don pants with min A OT Short Term Goal 4 (Week 1): Pt will transfer to [facility] with mod A  Skilled Therapeutic Interventions/Progress Updates:    Pt received getting out of bed with A from her NT using a STEDY as she needed to toilet.  Pt transferred to toilet via stedy.  From there worked with pt on self cleansing with max encouragement and cues as she is self limiting. Washed up and dressed sitting on BSC and then transferred squat pivot with bars and mod A back to w/c.  Taken to gym to work on cybex arm bike for slow concentrated [employer] for RUE. R arm has full AROM but absent proprioception, impaired sensation and therefore impaired coordination.  Pt did very well using bike for 10 min at low resistance.   Taken back to room and transferred back to bed. Set up with all needs met and alarm set.   Therapy Documentation Precautions:  Precautions Precautions: Fall Precaution Comments: Significant weakness at RLE, impaired R UE proprioception Restrictions Weight Bearing Restrictions: No    Pain:  continues to c/o R shoulder pain behind scapula - PA made aware and met with pt for treatment recommendations  Therapy/Group: Individual Therapy  [place] 09/15/2019, 12:35 PM

## 2019-09-15 NOTE — Progress Notes (Addendum)
Per therapy patient reported rigth shoulder pain yesterday--has been going on since admission but just reporting it now. Tenderness elicited mid- right scapular edge. She reports discomfort when supine. Will order films to rule out injury.Lidocaine patch at night and ice during the day.  Also --add Klonopin 1 mg to help with insomnia.

## 2019-09-15 NOTE — Progress Notes (Signed)
Nutrition Follow-up  RD working remotely.  DOCUMENTATION CODES:   Non-severe (moderate) malnutrition in context of chronic illness  INTERVENTION:   - Continue Ensure Enlive po TID, each supplement provides 350 kcal and 20 grams of protein  - Continue Magic cup BID with lunch and dinner meals, each supplement provides 290 kcal and 9 grams of protein  - Encourage adequate PO intake  NUTRITION DIAGNOSIS:   Moderate Malnutrition related to chronic illness as evidenced by mild fat depletion, mild muscle depletion, moderate muscle depletion.  Ongoing, being addressed via supplements  GOAL:   Patient will meet greater than or equal to 90% of their needs  Progressing  MONITOR:   PO intake, Supplement acceptance, Labs, Weight trends  REASON FOR ASSESSMENT:   Malnutrition Screening Tool    ASSESSMENT:   74 year old female with PMH of CKD, HTN, CLL, breast cancer s/p lumpectomy with XRT, pudendal neuralgia, anxiety disorder. Pt was admitted on 09/04/19 with history of multiple falls with progressive weakness. MRI brain done showing 2 cm cystic lesion with vasogenic edema and question of tumor versus cerebritis. Pt was started on Decadron and MRI brain with contrast showed 2 cm subcortical white matter lesion in parietal lobe without enhancement. Pt admitted to CIR on 1/05.  Pt accepting >90% of Ensure Enlive supplements per [facility] documentation. Will continue with current supplement regimen at this time given excellent PO intake at meals.  Weight fluctuating between 170-182 lbs since admission. Current weight is 173.9 lbs. RD will monitor for trends.  Meal Completion: 75-100% x last 8 meals  Medications reviewed and include: decadron, Ensure Enlive TID, SSI, lactulose, protonix, senna  Labs reviewed. CBG's: 88-137 x 24 hours  Diet Order:   Diet Order            Diet regular Room service appropriate? Yes; Fluid consistency: Thin  Diet effective now               EDUCATION NEEDS:   Education needs have been addressed  Skin:  Skin Assessment: Reviewed RN Assessment  Last BM:  09/15/19 small type 6  Height:   Ht Readings from Last 1 Encounters:  09/03/19 6\' 1"  (1.854 m)    Weight:   Wt Readings from Last 1 Encounters:  09/14/19 78.9 kg    Ideal Body Weight:  75 kg  BMI:  Body mass index is 22.95 kg/m.  Estimated Nutritional Needs:   Kcal:  1900-2100  Protein:  95-110 grams  Fluid:  >/= 1.9 L    Gaynell Face, MS, RD, LDN Inpatient Clinical Dietitian Pager: [phone] Weekend/After Hours: [phone]

## 2019-09-15 NOTE — Progress Notes (Signed)
[place] PHYSICAL MEDICINE & REHABILITATION PROGRESS NOTE  Subjective/Complaints: Patient seen laying in bed this morning.  She states she slept well overnight.  She notes improvement in pain with changes yesterday.  ROS: Denies CP, SOB, N/V/D  Objective: Vital Signs: Blood pressure 118/60, pulse 66, temperature 98.2 F (36.8 C), resp. rate 17, weight 78.9 kg, SpO2 100 %. No results found. Recent Labs    09/14/19 0645  WBC 89.7*  HGB 10.7*  HCT 35.8*  PLT 265   Recent Labs    09/14/19 0645  NA 138  K 4.7  CL 105  CO2 27  GLUCOSE 104*  BUN 17  CREATININE 1.17*  CALCIUM 8.8*    Physical Exam: BP 118/60   Pulse 66   Temp 98.2 F (36.8 C)   Resp 17   Wt 78.9 kg   SpO2 100%   BMI 22.95 kg/m  Constitutional: No distress . Vital signs reviewed. HENT: Normocephalic.  Atraumatic. Eyes: EOMI. No discharge. Cardiovascular: No JVD. Respiratory: Normal effort.  No stridor. GI: Non-distended. Skin: Warm and dry.  Intact. Psych: Normal mood.  Normal behavior. Musc: No edema in extremities.  No tenderness in extremities. Neuro: Alert Motor:  RUE: 4+/5 proximal to distal RLE: HF, KE 3 -/5, ADF 2/5 Increased tone right ankle,?  Improving  Assessment/Plan: 1. 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  Physiatrist is providing close team supervision and 24 hour management of active medical problems listed below.  Physiatrist and rehab team continue to assess barriers to discharge/monitor patient progress toward functional and medical goals  Care Tool:  Bathing  Bathing activity did not occur: Refused Body parts bathed by patient: Right arm, Left arm, Chest, Abdomen, Front perineal area, Right upper leg, Left upper leg   Body parts bathed by helper: Left lower leg, Right lower leg, Buttocks     Bathing assist Assist Level: Moderate Assistance  - Patient 50 - 74%     Upper Body Dressing/Undressing Upper body dressing   What is the patient wearing?: Pull over shirt    Upper body assist Assist Level: Minimal Assistance - Patient > 75%    Lower Body Dressing/Undressing Lower body dressing      What is the patient wearing?: Pants, Underwear/pull up     Lower body assist Assist for lower body dressing: Maximal Assistance - Patient 25 - 49%     Toileting Toileting    Toileting assist Assist for toileting: Moderate Assistance - Patient 50 - 74%     Transfers Chair/bed transfer  Transfers assist     Chair/bed transfer assist level: Moderate Assistance - Patient 50 - 74%     Locomotion Ambulation   Ambulation assist      Assist level: 2 helpers Assistive device: Other (comment)(rail in hall) Max distance: 30   Walk 10 feet activity   Assist     Assist level: 2 helpers Assistive device: Other (comment)(rail in hall)   Walk 50 feet activity   Assist Walk 50 feet with 2 turns activity did not occur: Safety/medical concerns         Walk 150 feet activity   Assist Walk 150 feet activity did not occur: Safety/medical concerns         Walk 10 feet on uneven surface  activity   Assist Walk 10 feet on uneven surfaces activity did not occur: Safety/medical concerns  Wheelchair     Assist   Type of Wheelchair: Manual    Wheelchair assist level: Moderate Assistance - Patient 50 - 74% Max wheelchair distance: 50    Wheelchair 50 feet with 2 turns activity    Assist        Assist Level: Moderate Assistance - Patient 50 - 74%   Wheelchair 150 feet activity     Assist Wheelchair 150 feet activity did not occur: Safety/medical concerns          Medical Problem List and Plan:  1. 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 Antithrombotics:   -DVT/anticoagulation:  Pharmaceutical: Lovenox   -antiplatelet therapy: n/a  3. Chronic pudendal pain/Pain Management: Cymbalta- no ext hemorrhoids   Continue methadone bid, afternoon dose added, changed dosing to 10 mg 3 times daily  Monitor with increased mobility  ?Rash allergy to Lyrica-tolerating retrial  Baclofen 5 TID started on 1/8  Added anusol supp HCT BID  Trial of Tucks pad to anal area QID  Continue ice  Will consider further medication adjustments if necessary 4. Mood: LCSW to follow for evaluation and support.   -antipsychotic agents: N/A  5. Neuropsych: This patient is ? fully capable of making decisions on her own behalf.  6. Skin/Wound Care: Routine pressure relief measures.  7. 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 Improved.   Creatinine 1.17 on 1/11  Encourage fluids  Cont to monitor 9. Hypothyroid: Resume supplement.  10. 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  Recommendations to reduce decadron to 4 mg at discharge, will follow up on frequency.   On Keppra and decadron-->tapered to bid on 1/5  12. Steroid induced hyperglycemia: Hgb A1c-5.4.   Slightly labile on 1/12  Monitor with increased mobility 13. OIC: On multiple medications--Laculose, Linzess, Miralax and senna  Increase bowel meds as necessary  Improving 14. Breast cancer in remission/CLL being observed:   WBCs 89.7 on 1/11.  Discussed with Neuro/Onc and Heme/Onc.  Per Heme/Onc cont to monitor and no cause for concern unless acute spike.  Afebrile  Cont to monitor 14. Iron deficiently anemia: Resolved  Added iron supplement   Hb 10.7 on 1/11  Cont to monitor 15. Spasticity of R ankle  Baclofen 5 3 times daily started on 1/8  PRAFO  ?  Improving 16.  Neurogenic bladder  Continue ditropan qhs   LOS: 6 days A FACE TO FACE EVALUATION WAS PERFORMED   Lorie Phenix 09/15/2019, 8:49 AM

## 2019-09-15 NOTE — Plan of Care (Signed)
  Problem: Consults Goal: RH GENERAL PATIENT EDUCATION Description: See Patient Education module for education specifics. Outcome: Progressing Goal: Nutrition Consult-if indicated Outcome: Progressing   Problem: RH BOWEL ELIMINATION Goal: RH STG MANAGE BOWEL WITH ASSISTANCE Description: STG Manage Bowel with mod Assistance.  Outcome: Progressing   Problem: RH BLADDER ELIMINATION Goal: RH STG MANAGE BLADDER WITH ASSISTANCE Description: STG Manage Bladder With mod Assistance Outcome: Progressing   Problem: RH SKIN INTEGRITY Goal: RH STG SKIN FREE OF INFECTION/BREAKDOWN Description: Patients skin will remain free from further infection or breakdown with min assist. Outcome: Progressing   Problem: RH SAFETY Goal: RH STG ADHERE TO SAFETY PRECAUTIONS W/ASSISTANCE/DEVICE Description: STG Adhere to Safety Precautions With min Assistance/Device. Outcome: Progressing   Problem: RH PAIN MANAGEMENT Goal: RH STG PAIN MANAGED AT OR BELOW PT'S PAIN GOAL Description: < 3 Outcome: Progressing

## 2019-09-15 NOTE — Progress Notes (Addendum)
Physical Therapy Weekly Progress Note  Patient Details  Name: Renee [name] MRN: [id number] Date of Birth: 08-Jun-1946  Beginning of progress report period: September 09, 2019 End of progress report period: September 15, 2019  Today's Date: 09/15/2019 PT Individual Time: 0806-0900 and [id number] PT Individual Time Calculation (min): 54 min and 68 min    Patient has met 2 of 4 short term goals. Renee [name] has been progressing slowly with therapy this past week due to varying levels of OOB activity tolerance due to chronic rectal pain. She requires min assist for bed mobility, mod assist for squat pivot transfers, and has progressed to performing sit<>stands and gait training in parallel bars with mod assist of 1. She continues to demonstrate impaired trunk control and midline awareness that becomes more exacerbated in standing. She continues to demonstrate significant R hemibody motor control impairments with overshooting/undershooting ataxia, impaired motor recruitment, and significantly impaired proprioceptive awareness of those extremities in space. She also continues to have significant R LE paresis to perform hip/knee flexion and ankle DF to initiate swing phase of gait.  Patient continues to demonstrate the following deficits muscle weakness, decreased cardiorespiratoy endurance, impaired timing and sequencing, abnormal tone, unbalanced muscle activation, motor apraxia, decreased coordination and decreased motor planning,  , decreased awareness, decreased problem solving, decreased memory and delayed processing and decreased sitting balance, decreased standing balance, decreased postural control and decreased balance strategies and therefore will continue to benefit from skilled PT intervention to increase functional independence with mobility.  Patient progressing toward long term goals. Continue plan of care.  PT Short Term Goals Week 1:  PT Short Term Goal 1 (Week 1): pt will performe bed mobility  with mod assist PT Short Term Goal 1 - Progress (Week 1): Met PT Short Term Goal 2 (Week 1): Pt will consistently transfer to [facility] with mod assist PT Short Term Goal 2 - Progress (Week 1): Met PT Short Term Goal 3 (Week 1): Pt will ambulate 7f with mod assist of 1 person. PT Short Term Goal 3 - Progress (Week 1): Progressing toward goal PT Short Term Goal 4 (Week 1): pt will propell WC 1036fwith min assist from PT PT Short Term Goal 4 - Progress (Week 1): Not met Week 2:  PT Short Term Goal 1 (Week 2): Pt will perform supine<>sit with CGA PT Short Term Goal 2 (Week 2): Pt will perform bed<>chair transfer with min assist PT Short Term Goal 3 (Week 2): Pt will ambulate at least 2054fsing LRAD with mod assist of 1 and +2 w/c follow PT Short Term Goal 4 (Week 2): Pt will perform at least 97f97f w/c propulsion with min assist  Skilled Therapeutic Interventions/Progress Updates:  Ambulation/gait training;Discharge planning;Functional mobility training;Psychosocial support;Visual/perceptual remediation/compensation;Therapeutic Activities;Balance/vestibular training;Disease management/prevention;Neuromuscular re-education;Skin care/wound management;Therapeutic Exercise;Wheelchair propulsion/positioning;UE/LE Strength taining/ROM;Splinting/orthotics;Pain management;DME/adaptive equipment instruction;Cognitive remediation/compensation;Community reintegration;Patient/family education;Stair training;UE/LE Coordination activities;Functional electrical stimulation   Session 1: Pt received supine in bed and agreeable to therapy session reporting recent pain medication administration. Supine>sit, HOB partially elevated and using bedrails, with CGA for trunk steadying. Sitting EOB donned B shoes max assist - cuing for trunk control due to pt leaning R. R squat pivot EOB>w/c with min/mod assist for lifting/pivoting hips with max multimodal cuing for sequencing of head/hips relationship and hand positioning.  Transported to/from gym in w/c for energy conservation. Performed block practice squat pivot transfers w/c<>EOM with min assist for pivoting hips and initially max multimodal cuing for squencing of head/hips relationship and hand placement with progression  to min cuing. Sit<>stand in // bars with min assist when pt using B UE support on bars but requiring mod assist when L hand on therapist's shoulder due to R trunk lean. Standing in // bars with L HHA on therapist's shoulder performed static standing balance targeting midline orientation with pt demonstrating decreased anterior trunk lean and improving midline orientation - mirror feedback for pt input as she consistently feels that she is falling forwards or backwards. Progressed to initiating L lateral weight shift to perform R hip/knee flexion to initiate pre-gait training; however, pt unable to lift R foot from floor and requiring mod assist for balance due to anterior trunk lean - max multimodal cuing for sequencing of weight shift and lifting/lowering R foot. Pt reporting need to use bathroom.  Transported back to room in w/c. Squat pivot w/c<>BSC over toilet using UE support on grab bar and mod assist for pivoting hips. Sit<>stand on/off BSC over toilet using grab bars with min assist for balance - total assist LB clothing management - pt continent of small BM requiring total assist for peri-care in sitting. Pt requesting to return to bed. L squat pivot w/c>EOB with mod assist for lifting/pivoting hips. Pt left supine in bed with needs in reach and bed alarm on.   Session 2: Pt received sitting in bed eating lunch reporting they had just delivered her meal tray - pt requesting time to complete meal and agreeable for therapist to return shortly. Upon therapist return pt sitting in bed reporting she was finished with lunch. Supine>sit, HOB partially elevated and using bedrail, with min assist for R LE management, CGA for trunk control, and cuing for  sequencing. Sitting EOB therapist donned B LE shoes max assist. R squat pivot EOB>w/c with mod assist for lifting/pivoting hips with cuing for hand placement and head/hips relationship.  Transported to/from gym in w/c. Sit<>stands in // bars with pt able to place L hand on therapist shoulder and come to standing with min assist when therapist provided max multimodal cuing for significant LEFT anterior trunk lean - mod assist for eccentric control on descent. Gait training in // bars 29f forwards/backwards x3 trials with mod assist for balance and R LE management - max multimodal sequential cuing for R/L lateral weight shift, R/L stepping, and R hand positioning on bar - therapist provided max manual facilitation for R LE stepping forwards/backwards with pt unable to clear foot from floor (donned R shoe cover) - pt continues to demonstrate intermittent R anterior trunk lean with L trunk twist when extending R knee during stance requiring increased trunk assist to maintain upright. Pt reporting onset of fatigue. Squat pivot w/c<>EOM with mod assist for lifting/pivoting hips and continued cuing for head/hips relationship and hand placement. Sit<>supine with min assist for R LE management and trunk control. R LE NMR in L sidelying using powder board targeting reciprocal movement of R hip/knee flexion/extension - assist provided for the movement. Transported back to room as pt noted to have been incontinent of small stool. Sit<>stand w/c<>sink with BUE support and min assist for balance while therapist performed total assist peri-care and LB clothing management. Squat pivot w/c>EOB with mod assist. Sit>supine with min assist. Pt left supine in bed with needs in reach and bed alarm on.  Therapy Documentation Precautions:  Precautions Precautions: Fall Precaution Comments: Significant weakness at RLE, impaired R UE proprioception Restrictions Weight Bearing Restrictions: No  Pain: Session 1: Reports increased  pain on R side of rectum - increases with activity -  provided rest breaks, repositioning, and emotional support throughout session - premedicated.   Session 2: Reports increasing rectal pain towards end of therapy session - RN notified at end - therapist provided rest breaks and repositioning for pain management.   Therapy/Group: Individual Therapy  Tawana Scale, PT, DPT 09/15/2019, 7:52 AM

## 2019-09-16 ENCOUNTER — Inpatient Hospital Stay (HOSPITAL_COMMUNITY): Payer: Medicare Other

## 2019-09-16 ENCOUNTER — Encounter (HOSPITAL_COMMUNITY): Payer: Self-pay | Admitting: Physical Medicine & Rehabilitation

## 2019-09-16 ENCOUNTER — Encounter (HOSPITAL_COMMUNITY): Payer: Medicare Other | Admitting: Psychology

## 2019-09-16 ENCOUNTER — Other Ambulatory Visit: Payer: Self-pay

## 2019-09-16 ENCOUNTER — Inpatient Hospital Stay (HOSPITAL_COMMUNITY): Payer: Medicare Other | Admitting: Occupational Therapy

## 2019-09-16 DIAGNOSIS — M898X1 Other specified disorders of bone, shoulder: Secondary | ICD-10-CM

## 2019-09-16 DIAGNOSIS — R4189 Other symptoms and signs involving cognitive functions and awareness: Secondary | ICD-10-CM | POA: Insufficient documentation

## 2019-09-16 DIAGNOSIS — N182 Chronic kidney disease, stage 2 (mild): Secondary | ICD-10-CM

## 2019-09-16 LAB — GLUCOSE, CAPILLARY
Glucose-Capillary: 80 mg/dL (ref 70–99)
Glucose-Capillary: 98 mg/dL (ref 70–99)
Glucose-Capillary: 93 mg/dL (ref 70–99)

## 2019-09-16 LAB — CBC
HCT: 34.4 % — ABNORMAL LOW (ref 36.0–46.0)
Hemoglobin: 10.4 g/dL — ABNORMAL LOW (ref 12.0–15.0)
MCH: 26.1 pg (ref 26.0–34.0)
MCHC: 30.2 g/dL (ref 30.0–36.0)
MCV: 86.4 fL (ref 80.0–100.0)
Platelets: 253 10*3/uL (ref 150–400)
RBC: 3.98 MIL/uL (ref 3.87–5.11)
RDW: 16.3 % — ABNORMAL HIGH (ref 11.5–15.5)
WBC: 85.9 10*3/uL (ref 4.0–10.5)
nRBC: 0 % (ref 0.0–0.2)

## 2019-09-16 MED ORDER — LIDOCAINE 5 % EX PTCH
1.0000 | MEDICATED_PATCH | CUTANEOUS | Status: DC
  Administered 2019-09-16 – 2019-09-24 (×8): 1 via TRANSDERMAL
  Filled 2019-09-16 (×9): qty 1

## 2019-09-16 MED ORDER — DICLOFENAC SODIUM 1 % EX GEL
2.0000 g | Freq: Four times a day (QID) | CUTANEOUS | Status: DC
  Administered 2019-09-16 – 2019-09-30 (×50): 2 g via TOPICAL
  Filled 2019-09-16: qty 100

## 2019-09-16 MED ORDER — SENNOSIDES-DOCUSATE SODIUM 8.6-50 MG PO TABS
1.0000 | ORAL_TABLET | Freq: Every day | ORAL | Status: DC
  Administered 2019-09-16 – 2019-09-25 (×10): 1 via ORAL
  Filled 2019-09-16 (×10): qty 1

## 2019-09-16 MED ORDER — BACLOFEN 10 MG PO TABS
10.0000 mg | ORAL_TABLET | Freq: Three times a day (TID) | ORAL | Status: DC
  Administered 2019-09-16 – 2019-09-30 (×41): 10 mg via ORAL
  Filled 2019-09-16 (×42): qty 1

## 2019-09-16 NOTE — Patient Care Conference (Signed)
Inpatient RehabilitationTeam Conference and Plan of Care Update Date: 09/16/2019   Time: 11:00 AM    Patient Name: Renee [name]      Medical Record Number: [id number]  Date of Birth: 08-17-1946 Sex: Female         Room/Bed: 4W15C/4W15C-01 Payor Info: Payor: [place] / Plan: [employer] MEDICARE / Product Type: *No Product type* /    Admit Date/Time:  09/09/2019 12:32 AM  Primary Diagnosis:  Mass of parietal lobe  Patient Active Problem List   Diagnosis Date Noted  . Pain of right scapula   . Cognitive deficits   . Neurogenic bladder   . CKD (chronic kidney disease), stage II   . Malnutrition of moderate degree 09/10/2019  . Leukocytosis   . Elevated BUN   . Mass of parietal lobe 09/09/2019  . Spastic hemiparesis ([place])   . Iron deficiency anemia   . Drug induced constipation   . Steroid-induced hyperglycemia   . Prolonged Q-T interval on ECG   . Low grade glioma of brain ([place])   . 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 Right sided weakness 09/03/2019  . Constipation 07/08/2019  . GERD (gastroesophageal reflux disease) 07/08/2019  . History of breast cancer 12/27/2017  . Chronic renal disease, stage 3, moderately decreased glomerular filtration rate between 30-59 mL/min/1.73 square meter 12/27/2017  . CLL (chronic lymphocytic leukemia) ([place]) 02/21/2017  . Chronic depression 05/14/2015  . Rectal pain, chronic 02/10/2015  . Chronic pain 12/28/2013  . 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 Hypothyroidism 01/07/2013  . Vitamin D deficiency 01/07/2013    Expected Discharge Date: Expected Discharge Date: 09/30/19  Team Members Present: Physician leading conference: Dr. Delice Lesch Social Worker Present: Lennart Pall, LCSW Nurse Present: Dorien Chihuahua, RN;Elena Paulita Cradle, RN Case Manager: Karene Fry, RN PT Present: Michaelene Song, PT OT Present: Meriel Pica, OT SLP Present: Charolett Bumpers, SLP PPS Coordinator present : Gunnar Fusi,  Novella Olive, PT     Current Status/Progress Goal Weekly Team Focus  Bowel/Bladder   continent of B/B LBM 01/12  remain continent with normal bowel pattern  assist with toileting prn laxatives prn   Swallow/Nutrition/ Hydration             ADL's   min A UB dressing, max LB bathing and dressing, max A squat pivot transfers, have been using stedy with toileting due to unstable R knee  min A (may need to be downgraded to mod A)  ADL training, RUE NMR and awareness, postural control, body awareness, mobility, pt education   Mobility   min assist bed mobility, mod assist squat pivot transfers, mod assist sit<>stands in // bars - pt's participation has been limited due to rectal pain  min assist overall  R hemibody NMR, standing balance, sitting balance, bed<>chair transfers, bed mobility, pt/family education, activity tolerance   Communication             Safety/Cognition/ Behavioral Observations            Pain   chronic rectal pain scheduled anusol suppos tucks tylenol prn  pain <=2/10  assess pain qshift and prn medicate prn   Skin   skin intact  skin remain intact no infection or breakdown  assess skin qshift and prn    Rehab Goals Patient on target to meet rehab goals: Yes *See Care Plan and progress notes for long and short-term goals.     Barriers to Discharge  Current Status/Progress Possible  Resolutions Date Resolved   Nursing                  PT                    OT                  SLP                SW Decreased caregiver support Hired attendant not available more than two days a week, son works part-time night shift and daughter works Games developer and lives about 40 minutes away from the patient Patient and family working on additional assistance for discharge          Discharge Planning/Teaching Needs:  plan for home with adult children providing 24/7 assistance.  TBD   Team Discussion: Rectal pain resolved, R scapular pain, meds adjusted, constipation, now  with frequent stools, low grade glioma.  RN R shou pain, xrays good, lidocaine patch, methadone for pain, cont B/B.  OT squat pivot min/mod, shou pain, Dtr working on caregiver support, ? SNF initially to give more time.  PT min bed, mod transfers, 10' mod/max parallel bars, blocking R leg, goals min/CGA.   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 Weekly Focus/Goal: Improve mobility, pain, bowel movements  Barriers to Discharge: Medical stability;Other (comments)  Barriers to Discharge Comments: Chronic pain Possible Resolutions to Barriers: Therapies, follow labs, optimize pain meds, optimize bowel meds   Continued Need for Acute Rehabilitation Level of Care: The patient requires daily medical management by a physician with specialized training in physical medicine and rehabilitation for the following reasons: Direction of a multidisciplinary physical rehabilitation program to maximize functional independence : Yes Medical management of patient stability for increased activity during participation in an intensive rehabilitation regime.: Yes Analysis of laboratory values and/or radiology reports with any subsequent need for medication adjustment and/or medical intervention. : Yes   I attest that I was present, lead the team conference, and concur with the assessment and plan of the team.   Retta Diones 09/16/2019, 8:23 PM   Team conference was held via web/ teleconference due to [place] - 19

## 2019-09-16 NOTE — Consult Note (Signed)
Neuropsychological Consultation   Patient:   Beatrize Kerschen   DOB:   Aug 30, 1946  MR Number:  [id number]  Location:  [place] [facility] [place] [id number] [place] [address] Dept: [place]: [phone]           Date of Service:   09/16/2019  Start Time:   11 AM End Time:   12 PM  Provider/Observer:  Ilean Skill, Psy.D.       Clinical Neuropsychologist       Billing Code/Service: [phone]  Chief Complaint:    Takelia Mcbrayer is a 74 year old female with history of CKD, HTN, CLL, breast cancer, pudendal neuralgia, anxiety disorder.  Admitted on 09/04/19 with 2 to 3 week history of multiple falls with progressvie weakness and reduced fine motor control of right hand and right foot (with foot drop).  MRI showed 2 cm cystic leasion with vasogenic edema and question of tumor vs infection.  Location subcortical white matter lesion in parietal lobe. Waiting of follow-up MRI after antibiotic treatment and course of steroids.     Reason for Service:  HPI: Tyller Gruhlke is a 74 year old female with history of CKD, HTN, CLL, breast cancer s/p lumpectomy with XRT, pudendal neuralgia, anxiety disorder who was admitted on 09/04/19 with 2 to 3-week history of multiple falls with progressive weakness affecting FMC of right hand and right foot drop. MRI brain done showing 2 cm cystic lesion with vasogenic edema and question of tumor versus cerebritis. She was transferred to [facility] for further work-up and evaluated by neurology. She was started on Decadron and MRI brain with contrast showed 2 cm subcortical white matter lesion in parietal lobe without enhancement--cerebritis or subacute infarct felt to be unlikely. LP done showing elevated glucose with 1 WBC and 89 RBC. CSF sent for flow cytometry and JC virus. Dr. Malen Gauze recommends biopsy with PET scan and Dr. Mickeal Skinner consulted for input and felt that CNS involvement and CLL was very  uncommon. He question nonneoplastic etiology such as vascular inflammatory process and recommend repeating contrast enhanced MRI in 1 month as well as decreasing Decadron to 4 mg daily at discharge. Patient to be discussed at tumor board on 1/11. Aggressive rehab recommended due to ongoing right-sided weakness affecting mobility and ADLs. CIR recommended due to functional decline.   Current Status:  Patient with history of anxeity and reports that has exacerbated anxiety and worry about why/what is happening and what impairments will continue.   She was oriented but with slowed information processing speed.  Reports difficulties with cognition, especially around simple cognitive processes.    Behavioral Observation: Gracious Liebenow  presents as a 74 y.o.-year-old Right Caucasian Female who appeared her stated age. her dress was Appropriate and she was Well Groomed and her manners were Appropriate to the situation.  her participation was indicative of Appropriate, Inattentive and Redirectable behaviors.  There were any physical disabilities noted.  she displayed an appropriate level of cooperation and motivation.     Interactions:    Active Appropriate  Attention:   abnormal and attention span appeared shorter than expected for age  Memory:   abnormal; remote memory intact, recent memory impaired  Visuo-spatial:  not examined although likely deficits to location of lesion  Speech (Volume):  low  Speech:   Slowed speech with some word finding deficits.    Thought Process:  Coherent and Relevant  Though Content:  WNL; not suicidal and  not homicidal  Orientation:   person, place, time/date and situation  Judgment:   Fair  Planning:   Poor  Affect:    Anxious  Mood:    Anxious  Insight:   Fair  Intelligence:   normal  Medical History:   Past Medical History:  Diagnosis Date  . Chronic renal disease, stage 3, moderately decreased glomerular filtration rate between 30-59 mL/min/1.73  square meter 12/27/2017  . Constipation   . Depression   . History of breast cancer 12/27/2017  . Hypertension   . 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 PVD (peripheral vascular disease) ([place])   . Thyroid disease    Psychiatric History:  Prior history of anxiety and depressive symptoms.  Past psychotropic included Cymbalta.  Current, Klonopin and Latuda.    Family Med/Psych History:  Family History  Problem Relation Age of Onset  . Healthy Mother   . Healthy Father   . Healthy Sister        "normal stuff for her age"  . Pancreatic cancer Brother    Impression/DX:  Arshdeep Arriaza is a 74 year old female with history of CKD, HTN, CLL, breast cancer, pudendal neuralgia, anxiety disorder.  Admitted on 09/04/19 with 2 to 3 week history of multiple falls with progressvie weakness and reduced fine motor control of right hand and right foot (with foot drop).  MRI showed 2 cm cystic leasion with vasogenic edema and question of tumor vs infection.  Location subcortical white matter lesion in parietal lobe. Waiting of follow-up MRI after antibiotic treatment and course of steroids.   Patient with history of anxeity and reports that has exacerbated anxiety and worry about why/what is happening and what impairments will continue.   She was oriented but with slowed information processing speed.  Reports difficulties with cognition, especially around simple cognitive processes.   Diagnosis:    Mass of parietal lobe - Plan: Ambulatory referral to Physical Medicine Rehab  Right sided weakness - Plan: Ambulatory referral to Physical Medicine Rehab  Pain - Plan: DG Scapula Right, DG Scapula Right         Electronically Signed   _______________________ Ilean Skill, Psy.D.

## 2019-09-16 NOTE — Progress Notes (Signed)
[place] PHYSICAL MEDICINE & REHABILITATION PROGRESS NOTE  Subjective/Complaints: Patient seen sitting up working with therapy this morning.  She states she did not sleep well overnight, because she received her medications late.  Denies complaints, but does admit to right scapula pain when specifically asked.  She also indicates frequent stooling.  ROS: + Right scapular pain, frequent stools.  Denies CP, SOB, N/V/D  Objective: Vital Signs: Blood pressure (!) 105/59, pulse (!) 57, temperature 98 F (36.7 C), temperature source Oral, resp. rate 18, weight 78.9 kg, SpO2 99 %. DG Scapula Right  Result Date: 09/15/2019 CLINICAL DATA:  Patient had multiple falls about 12 days ago onto right side, still having right shoulder pain and stiffness. EXAM: RIGHT SCAPULA - 2+ VIEWS COMPARISON:  None. FINDINGS: Glenohumeral joint is intact. No evidence of scapular fracture or humeral fracture. The acromioclavicular joint is intact. IMPRESSION: No fracture or dislocation. Electronically Signed   By: Suzy Bouchard M.D.   On: 09/15/2019 11:15   Recent Labs    09/14/19 0645 09/16/19 0459  WBC 89.7* 85.9*  HGB 10.7* 10.4*  HCT 35.8* 34.4*  PLT 265 253   Recent Labs    09/14/19 0645  NA 138  K 4.7  CL 105  CO2 27  GLUCOSE 104*  BUN 17  CREATININE 1.17*  CALCIUM 8.8*    Physical Exam: BP (!) 105/59 (BP Location: Left Arm)   Pulse (!) 57   Temp 98 F (36.7 C) (Oral)   Resp 18   Wt 78.9 kg   SpO2 99%   BMI 22.95 kg/m  Constitutional: No distress . Vital signs reviewed. HENT: Normocephalic.  Atraumatic. Eyes: EOMI. No discharge. Cardiovascular: No JVD. Respiratory: Normal effort.  No stridor. GI: Non-distended. Skin: Warm and dry.  Intact. Psych: Normal mood.  Normal behavior. Musc: Point tenderness right scapula Neuro: Alert Motor:  RUE: 4+/5 proximal to distal RLE: HF, KE 3 -/5, ADF 2/5, unchanged Increased tone right ankle,?  Improving  Assessment/Plan: 1. 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  Physiatrist is providing close team supervision and 24 hour management of active medical problems listed below.  Physiatrist and rehab team continue to assess barriers to discharge/monitor patient progress toward functional and medical goals  Care Tool:  Bathing  Bathing activity did not occur: Refused Body parts bathed by patient: Right arm, Left arm, Chest, Abdomen, Front perineal area, Right upper leg, Left upper leg   Body parts bathed by helper: Left lower leg, Right lower leg, Buttocks     Bathing assist Assist Level: Moderate Assistance - Patient 50 - 74%     Upper Body Dressing/Undressing Upper body dressing   What is the patient wearing?: Pull over shirt    Upper body assist Assist Level: Minimal Assistance - Patient > 75%    Lower Body Dressing/Undressing Lower body dressing      What is the patient wearing?: Pants, Underwear/pull up     Lower body assist Assist for lower body dressing: Maximal Assistance - Patient 25 - 49%     Toileting Toileting    Toileting assist Assist for toileting: Moderate Assistance - Patient 50 - 74%     Transfers Chair/bed transfer  Transfers assist     Chair/bed transfer assist level: Moderate Assistance - Patient 50 - 74%     Locomotion Ambulation   Ambulation assist      Assist level: Moderate Assistance -  Patient 50 - 74% Assistive device: Parallel bars Max distance: 76ft   Walk 10 feet activity   Assist     Assist level: Moderate Assistance - Patient - 50 - 74% Assistive device: Parallel bars   Walk 50 feet activity   Assist Walk 50 feet with 2 turns activity did not occur: Safety/medical concerns         Walk 150 feet activity   Assist Walk 150 feet activity did not occur: Safety/medical concerns         Walk 10 feet on uneven surface   activity   Assist Walk 10 feet on uneven surfaces activity did not occur: Safety/medical concerns         Wheelchair     Assist   Type of Wheelchair: Manual    Wheelchair assist level: Moderate Assistance - Patient 50 - 74% Max wheelchair distance: 50    Wheelchair 50 feet with 2 turns activity    Assist        Assist Level: Moderate Assistance - Patient 50 - 74%   Wheelchair 150 feet activity     Assist Wheelchair 150 feet activity did not occur: Safety/medical concerns          Medical Problem List and Plan:  1. 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  2. Antithrombotics:   -DVT/anticoagulation: Pharmaceutical: Lovenox   -antiplatelet therapy: n/a  3. Chronic pudendal pain/Pain Management: Cymbalta  Continue methadone bid, afternoon dose added, changed dosing to 10 mg 3 times daily  Monitor with increased mobility  ?Rash allergy to Lyrica-tolerating retrial, will consider further increase if necessary  Baclofen 5 TID started on 1/8, increased on 1/13  Added anusol supp HCT BID  Trial of Tucks pad to anal area QID  Continue ice  Will consider further medication adjustments if necessary 4. Mood: LCSW to follow for evaluation and support.   -antipsychotic agents: N/A  5. Neuropsych: This patient is ? fully capable of making decisions on her own behalf.  6. Skin/Wound Care: Routine pressure relief measures.  7. 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 Improved.   Creatinine 1.17 on 1/11, labs ordered for tomorrow  Encourage fluids  Cont to monitor 9. Hypothyroid: Resume supplement.  10. H/o depression: On Lutuda, cymbalta and Clonazepam  11. Low grade  glioma: Follow up MRI brain in one month.  Recommendations to reduce decadron to 4 mg at discharge, will follow up on frequency.   On Keppra and decadron-->tapered to bid on 1/5  12. Steroid induced hyperglycemia: Hgb A1c-5.4.   Relatively controlled on 1/13  Monitor with increased mobility 13. OIC: On multiple medications:  Cont Laculose  Senna decreased on 1/13  Linzess d/ced  Improving 14. Breast cancer in remission/CLL being observed:   WBCs 85.9 on 1/13.  Discussed with Neuro/Onc and Heme/Onc.  Per Heme/Onc cont to monitor and no cause for concern unless acute spike.  Afebrile  Cont to monitor 14. Iron deficiently anemia: Resolved  Added iron supplement   Hb 10.4 on 1/13  Cont to monitor 15. Spasticity of R ankle  See #3  PRAFO  ?  Improving 16.  Neurogenic bladder  Continue ditropan qhs  17.  Right scapular pain  X-ray reviewed, unremarkable for fracture  Voltaren gel ordered  on 1/13  LOS: 7 days A FACE TO FACE EVALUATION WAS PERFORMED  Tristy Udovich Lorie Phenix 09/16/2019, 8:55 AM

## 2019-09-16 NOTE — Progress Notes (Signed)
Occupational Therapy Weekly Progress Note  Patient Details  Name: Renee [name] MRN: [id number] Date of Birth: May 26, 1946  Beginning of progress report period: September 08, 2018 End of progress report period: September 15, 2018  Today's Date: 09/16/2019 OT Individual Time: 0945-1100 OT Individual Time Calculation (min): 75 min    Patient has met 2 of 4 short term goals.  Pt is developing RUE coordination to be able to bathe self and don a tshirt with supervision but continues to have great difficulty with control and accuracy with reaching due to sensory impairments. She has made some progress with her squat pivot transfers and she is learning to focus on smooth, methodical movements to avoid the ataxic movements that increase when she moves quickly.  Patient continues to demonstrate the following deficits: decreased cardiorespiratoy endurance, unbalanced muscle activation and decreased coordination, RUE/ RLE sensory loss, impaired proprioception and decreased sitting balance, decreased standing balance, decreased postural control and decreased balance strategies and therefore will continue to benefit from skilled OT intervention to enhance overall performance with BADL.  Patient progressing toward long term goals..  Continue plan of care.  OT Short Term Goals Week 1:  OT Short Term Goal 1 (Week 1): Pt will don shirt with (S) OT Short Term Goal 1 - Progress (Week 1): Met OT Short Term Goal 2 (Week 1): Pt will demonstrate improved RUE proprioception by having 75% reaching accuracy OT Short Term Goal 2 - Progress (Week 1): Progressing toward goal OT Short Term Goal 3 (Week 1): Pt will don pants with min A OT Short Term Goal 3 - Progress (Week 1): Progressing toward goal OT Short Term Goal 4 (Week 1): Pt will transfer to [facility] with mod A OT Short Term Goal 4 - Progress (Week 1): Met Week 2:  OT Short Term Goal 1 (Week 2): Pt will complete squat pivot transfers to toilet with min A. OT Short Term  Goal 2 (Week 2): Pt will be able to self cleanse post toileting with CGA to support balance. OT Short Term Goal 3 (Week 2): Pt will demonstrate improved dynamic sit balance to don pants over feet with min A. OT Short Term Goal 4 (Week 2): Pt will demonstrate lateral leans with min A to pull pants up/down hips with mod A.  Skilled Therapeutic Interventions/Progress Updates:    Pt received in w/c having just completed toileting with NT.  Pt agreeable to going to gym to focus on balance and transfers.  Spent a significant time on technique to help pt learn to move as safely as possible.  From wc to move to her R to mat, had pt "ground" her R hand to mat for stability to avoid ataxic movement. Then focused on forward wt shifts to lift hips as therapist stabilized R knee.  Then pt was able to do a smooth squat pivot to mat with mod A.   On mat, focused on postural control as she has a pelvic tilt to her R with lateral curvature to R.  Placed wedge under R hip and had pt work on dynamic elongation to her L with hand on ball.   Tried to work on [employer] but her shoulder blade pain was causing her too much discomfort.   Sit to squat numerous times to faciliate RLE.  Squat pivot back to w/c to L mod A and then to EOB mod A. Much improved postural control with transfers. Adjusted into bed and RN came in to apply voltaren gel to  her shoulder. Pt resting in bed with all needs met. Bed alarm on.    Therapy Documentation Precautions:  Precautions Precautions: Fall Precaution Comments: Significant weakness at RLE, impaired R UE proprioception Restrictions Weight Bearing Restrictions: No  Pain: Pain Assessment Pain Scale: 0-10 Pain Score: 5  Pain Type: Chronic pain Pain Location: Shoulder Pain Orientation: Right Pain Descriptors / Indicators: Aching Pain Onset: On-going Pain Intervention(s): RN made aware ADL: ADL Eating: Set up Where Assessed-Eating: Bed level Grooming: Setup Where  Assessed-Grooming: Sitting at sink Upper Body Bathing: Contact guard Where Assessed-Upper Body Bathing: Edge of bed Lower Body Bathing: Moderate assistance Where Assessed-Lower Body Bathing: Edge of bed, Bed level Upper Body Dressing: Minimal assistance Where Assessed-Upper Body Dressing: Edge of bed Lower Body Dressing: Maximal assistance Where Assessed-Lower Body Dressing: Edge of bed, Bed level Toileting: Maximal assistance Where Assessed-Toileting: Glass blower/designer: Moderate assistance Toilet Transfer Method: Squat pivot Toilet Transfer Equipment: Grab bars, Raised toilet seat   Therapy/Group: Individual Therapy  [place] 09/16/2019, 12:30 PM

## 2019-09-16 NOTE — Progress Notes (Signed)
Team Conference Report to Patient/Family  Team Conference discussion was reviewed with the patient and daughter [name], including goals, any changes in plan of care and target discharge date of 09/30/19.  Patient and caregiver express understanding of information reviewed. Patient expressed concern that she will be "discharge too soon and be stuck in the bed at home". Daughter confirmed that she has arranged for 24/7 care between herself, brother and hired help. Noted concerns about home: requesting hospital bed for discharge. PT/OT will make recommendations for follow up and other DME for discharge.   Margarito Liner 09/16/2019, 5:02 PM

## 2019-09-16 NOTE — Plan of Care (Signed)
  Problem: Consults Goal: RH GENERAL PATIENT EDUCATION Description: See Patient Education module for education specifics. Outcome: Progressing   Problem: RH BOWEL ELIMINATION Goal: RH STG MANAGE BOWEL WITH ASSISTANCE Description: STG Manage Bowel with mod Assistance.  Outcome: Progressing Flowsheets (Taken 09/16/2019 1616) STG: Pt will manage bowels with assistance: 4-Minimum assistance   Problem: RH BLADDER ELIMINATION Goal: RH STG MANAGE BLADDER WITH ASSISTANCE Description: STG Manage Bladder With mod Assistance Outcome: Progressing Flowsheets (Taken 09/16/2019 1616) STG: Pt will manage bladder with assistance: 4-Minimal assistance   Problem: RH SKIN INTEGRITY Goal: RH STG SKIN FREE OF INFECTION/BREAKDOWN Description: Patients skin will remain free from further infection or breakdown with min assist. Outcome: Progressing   Problem: RH SAFETY Goal: RH STG ADHERE TO SAFETY PRECAUTIONS W/ASSISTANCE/DEVICE Description: STG Adhere to Safety Precautions With min Assistance/Device. Outcome: Progressing Flowsheets (Taken 09/16/2019 1616) STG:Pt will adhere to safety precautions with assistance/device: 4-Minimal assistance   Problem: RH PAIN MANAGEMENT Goal: RH STG PAIN MANAGED AT OR BELOW PT'S PAIN GOAL Description: < 3 Outcome: Progressing

## 2019-09-16 NOTE — Progress Notes (Addendum)
Physical Therapy Session Note  Patient Details  Name: Renee [name] MRN: [id number] Date of Birth: 06/05/1946  Today's Date: 09/16/2019 PT Individual Time: 0800-0903 PT Individual Time Calculation (min): 63 min   Short Term Goals:  Week 2:  PT Short Term Goal 1 (Week 2): Pt will perform supine<>sit with CGA PT Short Term Goal 2 (Week 2): Pt will perform bed<>chair transfer with min assist PT Short Term Goal 3 (Week 2): Pt will ambulate at least 57ft using LRAD with mod assist of 1 and +2 w/c follow PT Short Term Goal 4 (Week 2): Pt will perform at least 7ft of w/c propulsion with min assist  Skilled Therapeutic Interventions/Progress Updates:  tx 1:  Pt in bathroom with NT, using [place] for toilet transfer.  +2 for peri care while PT steadied pt in standing.  Pt performed oral care at sink, from w/c level with min assist for set up.  neuromuscular re-education via forced use for use of bil UEs to propel w/c, with mod assist for steering, x 25'.  Pt has difficulty functionally pushing with R hand, despite Theraband wrap on rim.   W/c propulsion using bil LEs with mod assist for RLE progression, x 50', on a straight trajectory.   Squat pivot wc>< NuStep with +2 for 2nd person to place cushion in seat; pt performed with mod assist. Level 4 resistance x 10 min iwht rest breaks PRN.  PT assisted with RLE movement.  At end of session, pt requested getting back to pain due to rectal pain.  Sit> stand from w/c with min assist; mod assist to turn; pt using bed rail with L hand.  Supine> sit with mod assist.  Alarm set and needs placed at hand.  tx 2:  Pt resting in bed.  She rated pain 5/10 rectum; premedicated.  neuromuscular re-education via multimodal cues and demo for :supine - bil bridging, R quad sets, L straight leg raises; L side lying- bil hip flexion/extension with flexed knees, L hip abduction with flexed hips and knees.   Bed mobility training for rolling L, as per home set up.  Min  asisst> supervision and cues for sequencing.  L side lying > sitting with mod assist for elevating trunk using LUE.  L lateral leans x 5.  PT donned bil shoes.  Squat pivot to R with mod assist.  Gait training in hallway, using L rail, with ACE RLE for foot drop, shoe cover on R shoe.  Mod/max assist for RLE stance stability; knee tends to hyperextend and needed to be blocked by PT.  Pt able to progress RLE forward q step, x 12'; max cues for sequencing and R knee extension.  Pt would benefit from [facility] (and possibly R knee cage) to prevent R knee hyperextension which is painful to her.  Stand pivot using L hand on upper bedrail, mod assist. Min assist for RLE during sit> supine.  At end of session, pt in bed with needs at hand and bed alarm set.  Pillow under L calf at her request.  Pt has difficulty remembering sequencing for rolling, sit> stand.  PT wrote steps of these tasks on 3 x 5 cards, and left in pt's room.       Therapy Documentation Precautions:  Precautions Precautions: Fall Precaution Comments: Significant weakness at RLE, impaired R UE proprioception Restrictions Weight Bearing Restrictions: No   Pain: Pain Assessment Pain Scale: 0-10 AM-Pain Score: 4/10 R rectum; premedicated; avoiding sitting long periods of time by returning  to bed between session PM- pain score 5/10 rectum; premedicated; returned to bed at end of session      Therapy/Group: Individual Therapy  Any Mcneice 09/16/2019, 10:31 AM

## 2019-09-17 ENCOUNTER — Inpatient Hospital Stay (HOSPITAL_COMMUNITY): Payer: Medicare Other

## 2019-09-17 ENCOUNTER — Inpatient Hospital Stay (HOSPITAL_COMMUNITY): Payer: Medicare Other | Admitting: Physical Therapy

## 2019-09-17 ENCOUNTER — Inpatient Hospital Stay (HOSPITAL_COMMUNITY): Payer: Medicare Other | Admitting: Occupational Therapy

## 2019-09-17 LAB — GLUCOSE, CAPILLARY
Glucose-Capillary: 123 mg/dL — ABNORMAL HIGH (ref 70–99)
Glucose-Capillary: 99 mg/dL (ref 70–99)
Glucose-Capillary: 99 mg/dL (ref 70–99)
Glucose-Capillary: 107 mg/dL — ABNORMAL HIGH (ref 70–99)
Glucose-Capillary: 123 mg/dL — ABNORMAL HIGH (ref 70–99)

## 2019-09-17 LAB — BASIC METABOLIC PANEL
Anion gap: 6 (ref 5–15)
BUN: 22 mg/dL (ref 8–23)
CO2: 26 mmol/L (ref 22–32)
Calcium: 8.7 mg/dL — ABNORMAL LOW (ref 8.9–10.3)
Chloride: 107 mmol/L (ref 98–111)
Creatinine, Ser: 1.22 mg/dL — ABNORMAL HIGH (ref 0.44–1.00)
GFR calc Af Amer: 52 mL/min — ABNORMAL LOW (ref >60)
GFR calc non Af Amer: 45 mL/min — ABNORMAL LOW (ref >60)
Glucose, Bld: 118 mg/dL — ABNORMAL HIGH (ref 70–99)
Potassium: 4.6 mmol/L (ref 3.5–5.1)
Sodium: 139 mmol/L (ref 135–145)

## 2019-09-17 MED ORDER — LINACLOTIDE 145 MCG PO CAPS
145.0000 ug | ORAL_CAPSULE | Freq: Every day | ORAL | Status: DC
  Administered 2019-09-18 – 2019-09-30 (×13): 145 ug via ORAL
  Filled 2019-09-17 (×13): qty 1

## 2019-09-17 NOTE — Progress Notes (Signed)
Occupational Therapy Session Note  Patient Details  Name: Renee [name] MRN: [id number] Date of Birth: [birth year]  Today's Date: 09/17/2019 OT Individual Time: 7737-5051 OT Individual Time Calculation (min): 45 min    Short Term Goals: Week 2:  OT Short Term Goal 1 (Week 2): Pt will complete squat pivot transfers to toilet with min A. OT Short Term Goal 2 (Week 2): Pt will be able to self cleanse post toileting with CGA to support balance. OT Short Term Goal 3 (Week 2): Pt will demonstrate improved dynamic sit balance to don pants over feet with min A. OT Short Term Goal 4 (Week 2): Pt will demonstrate lateral leans with min A to pull pants up/down hips with mod A.  Skilled Therapeutic Interventions/Progress Updates:    Pt received in wc and agreeable to working on UB self care in wc at sink and LB from EOB/bed.  Pt completed grooming and UB bathing with set up demonstrating improved coordination/ control of RUE with washing under arm and applying deoderant. Continues to need min A with donning shirt to start R arm in and continues applying with min cues.    Focused on controlled squat pivot transfer to L side to sit on EOB by pressing R hand on to top of R thigh to prevent arm from moving. Pt did well with forward wt shift and able to pivot hips to her L with mod A.  Focused on balance/core control sitting on EOB while leaning side to side to doff pants over hips with lateral leans and then reaching forward with min A to wash down to her feet.  Pt moved to supine and crossed R leg over L knee to don pants over foot, then she did the other foot. Worked on bridging to pull pants over hips with mod A.. Practiced bridging several times for hip strengthening.    Pt resting in bed with all needs met. Bed alarm set.  Therapy Documentation Precautions:  Precautions Precautions: Fall Precaution Comments: Significant weakness at RLE, impaired R UE proprioception Restrictions Weight Bearing  Restrictions: No    Vital Signs: Therapy Vitals Temp: 99.7 F (37.6 C) Temp Source: Oral Pulse Rate: (!) 55 Resp: 16 BP: (!) 93/50 Patient Position (if appropriate): Lying Oxygen Therapy SpO2: 97 % O2 Device: Room Air Pain: Pain Assessment Pain Scale: 0-10 Pain Score: 5  Pain Type: Chronic pain Pain Location: Shoulder Pain Orientation: Right Pain Descriptors / Indicators: Aching;Discomfort Pain Frequency: Constant Pain Onset: On-going Patients Stated Pain Goal: 3 Pain Intervention(s): Medication (See eMAR) ADL: ADL Eating: Set up Where Assessed-Eating: Bed level Grooming: Setup Where Assessed-Grooming: Sitting at sink Upper Body Bathing: Contact guard Where Assessed-Upper Body Bathing: Edge of bed Lower Body Bathing: Moderate assistance Where Assessed-Lower Body Bathing: Edge of bed, Bed level Upper Body Dressing: Minimal assistance Where Assessed-Upper Body Dressing: Edge of bed Lower Body Dressing: Maximal assistance Where Assessed-Lower Body Dressing: Edge of bed, Bed level Toileting: Maximal assistance Where Assessed-Toileting: Glass blower/designer: Moderate assistance Toilet Transfer Method: Squat pivot Toilet Transfer Equipment: Grab bars, Raised toilet seat      Therapy/Group: Individual Therapy  Renee [name] 09/17/2019, 8:44 AM

## 2019-09-17 NOTE — Progress Notes (Signed)
Physical Therapy Session Note  Patient Details  Name: [name] MRN: [id number] Date of Birth: [birth year]  Today's Date: 09/17/2019 PT Individual Time: 1118-1205 PT Individual Time Calculation (min): 47 min   Short Term Goals:  Week 2:  PT Short Term Goal 1 (Week 2): Pt will perform supine<>sit with CGA PT Short Term Goal 2 (Week 2): Pt will perform bed<>chair transfer with min assist PT Short Term Goal 3 (Week 2): Pt will ambulate at least 5ft using LRAD with mod assist of 1 and +2 w/c follow PT Short Term Goal 4 (Week 2): Pt will perform at least 32ft of w/c propulsion with min assist  Skilled Therapeutic Interventions/Progress Updates:  Pt resting in bed.  She reported pain L rectum 7/10; premedicated.  Bed mobility training for rolling : to R without cues, supervision; to L with supervision, with 1 cue.  In flat bed, no rails and no cue cards.  L side lying> sitting iwht mod assist and mod cues.  Squat pivot to L iwht mod assist.  PT re-adjusted pt's personal cushion in w/c as it tends to move as she moves.  Stand pivot using bed rail with L hand, min assist back to bed.  Sit> stand in parallel bars with mod assist.  neuromuscular re-education via multimodal cues; R side lying- L hip abduction with flexed hips and knees; supine- bil bridging with assistance to stabilize RLE.  Gait training in parallel bars, RAFO with pre-tibial component, x 5' forward with, mod assist; backwards x 5' with max assist due to variable step lengths and RLE instability. Pt needed asisstance for placement of R foot at times, R stance stability, control of R knee hyperextension; mod cues for sequencing including advancing R hand.  At end of session, pt in bed with needs at hand and alarm set.     Therapy Documentation Precautions:  Precautions Precautions: Fall Precaution Comments: Significant weakness at RLE, impaired R UE proprioception Restrictions Weight Bearing Restrictions: No          Therapy/Group: Individual Therapy  Renee [name] 09/17/2019, 12:18 PM

## 2019-09-17 NOTE — Progress Notes (Signed)
Physical Therapy Session Note  Patient Details  Name: Renee [name] MRN: [id number] Date of Birth: Nov 28, 1945  Today's Date: 09/17/2019 PT Individual Time: [id number] PT Individual Time Calculation (min): 42 min   Short Term Goals: Week 2:  PT Short Term Goal 1 (Week 2): Pt will perform supine<>sit with CGA PT Short Term Goal 2 (Week 2): Pt will perform bed<>chair transfer with min assist PT Short Term Goal 3 (Week 2): Pt will ambulate at least 59ft using LRAD with mod assist of 1 and +2 w/c follow PT Short Term Goal 4 (Week 2): Pt will perform at least 27ft of w/c propulsion with min assist  Skilled Therapeutic Interventions/Progress Updates:   Pt received in bathroom on [facility] over toilet via stedy with nursing staff. Therapist assumed care of patient. Pt continent of bladder and small bowel - required total assist peri-care. Sit<>stands in stedy with min assist for lifting and balance due to R lateral trunk lean and decreased R hip/knee extension during total assist LB clothing management - cuing for improved upright posture and increased R LE extension. Stedy transfer to w/c.  Transported to/from gym in w/c. In // bars performed sit<>stands using R hand supported on bar and L UE supported on therapist's shoulder with min assist for lifting with max cuing for L lateral and anterior trunk lean upon coming to standing; returns to sitting via use of B UE support on bars and min assist for lowering. Gait training in // bars ~59ft down/back x2 then ~80ft forward x1 with L UE support on bar and R hand on therapist's shoulder (her thumb hurts when holding onto bar) with mod assist for balance - max multimodal cuing for sequencing of R/L lateral weight shifting and stepping - pt able to bring R foot through during swing phase with assistance ~25% of the time and continues to require therapist providing mod/max manual facilitation to prevent R knee buckling or hyperextension during stance (knee cage unavailable  at this time). Transported back to room and pt requesting to return to bed. L squat pivot w/c>EOB with min/mod assist for lifting/pivoting hips. Sit>supine with min assist for B LE management. Pt therapeutically positioned in L sidelying for pressure relief with needs in reach and bed alarm on.  Therapy Documentation Precautions:  Precautions Precautions: Fall Precaution Comments: Significant weakness at RLE, impaired R UE proprioception Restrictions Weight Bearing Restrictions: No  Pain: Pt reports R lower scapula pain during session - provided rest breaks and repositioning for pain management - notified RN of pt's request for lidocaine patch.    Therapy/Group: Individual Therapy  Tawana Scale, PT, DPT 09/17/2019, 7:55 AM

## 2019-09-17 NOTE — Plan of Care (Signed)
  Problem: Consults Goal: RH GENERAL PATIENT EDUCATION Description: See Patient Education module for education specifics. Outcome: Progressing Goal: Nutrition Consult-if indicated Outcome: Progressing   Problem: RH BOWEL ELIMINATION Goal: RH STG MANAGE BOWEL WITH ASSISTANCE Description: STG Manage Bowel with mod Assistance.  Outcome: Progressing   Problem: RH BLADDER ELIMINATION Goal: RH STG MANAGE BLADDER WITH ASSISTANCE Description: STG Manage Bladder With mod Assistance Outcome: Progressing   Problem: RH SKIN INTEGRITY Goal: RH STG SKIN FREE OF INFECTION/BREAKDOWN Description: Patients skin will remain free from further infection or breakdown with min assist. Outcome: Progressing   Problem: RH SAFETY Goal: RH STG ADHERE TO SAFETY PRECAUTIONS W/ASSISTANCE/DEVICE Description: STG Adhere to Safety Precautions With min Assistance/Device. Outcome: Progressing   Problem: RH PAIN MANAGEMENT Goal: RH STG PAIN MANAGED AT OR BELOW PT'S PAIN GOAL Description: < 3 Outcome: Progressing

## 2019-09-17 NOTE — Progress Notes (Signed)
[facility] PHYSICAL MEDICINE & REHABILITATION PROGRESS NOTE  Subjective/Complaints: Patient seen sitting up working with therapy this morning.  She says she was not aware that she had elected to be DNR and would like to reverse this decision.  She also states that she had 4 loose BM yesterday so her Linzess was stopped but she had no BM this morning and received Lactulose. She would prefer to take Linzess daily as had been recommended by her gastroenterologist for IBS.   ROS: + Right scapular pain, frequent stools.  Denies CP, SOB, N/V/D  Objective: Vital Signs: Blood pressure (!) 93/50, pulse (!) 55, temperature 99.7 F (37.6 C), temperature source Oral, resp. rate 16, weight 78.9 kg, SpO2 97 %. DG Scapula Right  Result Date: 09/15/2019 CLINICAL DATA:  Patient had multiple falls about 12 days ago onto right side, still having right shoulder pain and stiffness. EXAM: RIGHT SCAPULA - 2+ VIEWS COMPARISON:  None. FINDINGS: Glenohumeral joint is intact. No evidence of scapular fracture or humeral fracture. The acromioclavicular joint is intact. IMPRESSION: No fracture or dislocation. Electronically Signed   By: Suzy Bouchard M.D.   On: 09/15/2019 11:15   Recent Labs    09/16/19 0459  WBC 85.9*  HGB 10.4*  HCT 34.4*  PLT 253   Recent Labs    09/17/19 0504  NA 139  K 4.6  CL 107  CO2 26  GLUCOSE 118*  BUN 22  CREATININE 1.22*  CALCIUM 8.7*    Physical Exam: BP (!) 93/50 (BP Location: Left Arm)   Pulse (!) 55   Temp 99.7 F (37.6 C) (Oral)   Resp 16   Wt 78.9 kg   SpO2 97%   BMI 22.95 kg/m  Constitutional: No distress . Vital signs reviewed. HENT: Normocephalic.  Atraumatic. Eyes: EOMI. No discharge. Cardiovascular: No JVD. Respiratory: Normal effort.  No stridor. GI: Non-distended. Skin: Warm and dry.  Intact. Psych: Normal mood.  Normal behavior. Musc: Point tenderness right scapula Neuro: Alert Motor:  RUE: 4+/5 proximal to distal RLE: HF, KE 3 -/5, ADF  2/5, unchanged Increased tone right ankle,?  Improving  Assessment/Plan: 1. 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  Physiatrist is providing close team supervision and 24 hour management of active medical problems listed below.  Physiatrist and rehab team continue to assess barriers to discharge/monitor patient progress toward functional and medical goals  Care Tool:  Bathing  Bathing activity did not occur: Refused Body parts bathed by patient: Right arm, Left arm, Chest, Abdomen, Front perineal area, Right upper leg, Left upper leg   Body parts bathed by helper: Left lower leg, Right lower leg, Buttocks     Bathing assist Assist Level: Moderate Assistance - Patient 50 - 74%     Upper Body Dressing/Undressing Upper body dressing   What is the patient wearing?: Pull over shirt    Upper body assist Assist Level: Minimal Assistance - Patient > 75%    Lower Body Dressing/Undressing Lower body dressing      What is the patient wearing?: Pants, Underwear/pull up     Lower body assist Assist for lower body dressing: Maximal Assistance - Patient 25 - 49%     Toileting Toileting    Toileting assist Assist for toileting: Moderate Assistance - Patient 50 - 74%     Transfers Chair/bed transfer  Transfers assist     Chair/bed transfer assist level: Moderate Assistance -  Patient 50 - 74%     Locomotion Ambulation   Ambulation assist      Assist level: Maximal Assistance - Patient 25 - 49% Assistive device: Other (comment)(hallway rail) Max distance: 12   Walk 10 feet activity   Assist     Assist level: Maximal Assistance - Patient 25 - 49% Assistive device: Other (comment)(hallway rail)   Walk 50 feet activity   Assist Walk 50 feet with 2 turns activity did not occur: Safety/medical concerns         Walk 150 feet  activity   Assist Walk 150 feet activity did not occur: Safety/medical concerns         Walk 10 feet on uneven surface  activity   Assist Walk 10 feet on uneven surfaces activity did not occur: Safety/medical concerns         Wheelchair     Assist   Type of Wheelchair: Manual    Wheelchair assist level: Moderate Assistance - Patient 50 - 74% Max wheelchair distance: 50    Wheelchair 50 feet with 2 turns activity    Assist        Assist Level: Moderate Assistance - Patient 50 - 74%   Wheelchair 150 feet activity     Assist Wheelchair 150 feet activity did not occur: Safety/medical concerns          Medical Problem List and Plan:  1. 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 Antithrombotics:   -DVT/anticoagulation: Pharmaceutical: Lovenox   -antiplatelet therapy: n/a  3. Chronic pudendal pain/Pain Management: Cymbalta  Continue methadone bid, afternoon dose added, changed dosing to 10 mg 3 times daily  Monitor with increased mobility  ?Rash allergy to Lyrica-tolerating retrial, will consider further increase if necessary  Baclofen 5 TID started on 1/8, increased on 1/13  Added anusol supp HCT BID  Trial of Tucks pad to anal area QID  Continue ice  Will consider further medication adjustments if necessary 4. Mood: LCSW to follow for evaluation and support.   -antipsychotic agents: N/A  5. Neuropsych: This patient is ? fully capable of making decisions on her own behalf.  6. Skin/Wound Care: Routine pressure relief measures.  7. 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 Improved.   Creatinine 1.17 on 1/11, 1.22 on 1/14. Will order for tomorrow to trend.   Encourage fluids  Cont to monitor 9. Hypothyroid: Resume supplement.  10. H/o depression: On Lutuda, cymbalta and Clonazepam  11. Low grade glioma: Follow up MRI brain  in one month.  Recommendations to reduce decadron to 4 mg at discharge, will follow up on frequency.   On Keppra and decadron-->tapered to bid on 1/5  12. Steroid induced hyperglycemia: Hgb A1c-5.4.   Relatively controlled on 1/13, 1/14.   Monitor with increased mobility 13. OIC: On multiple medications:  Cont Laculose  Senna decreased on 1/13  Linzess d/ced  1/14: Patient states that she prefers to continue on Linzess which she feels helps control her IBS. Discussed that it was likely discontinued because of her loose stool yesterday. Since she is not having loose stool today and prefers to be on Linzess, will restart.  14. Breast cancer in remission/CLL being observed:   WBCs 85.9 on 1/13.  Discussed with Neuro/Onc and Heme/Onc.  Per Heme/Onc cont to monitor and no cause for concern unless acute spike.  Afebrile  Cont to monitor 14. Iron deficiently anemia: Resolved  Added iron supplement   Hb 10.4 on 1/13  Cont to monitor 15. Spasticity of R ankle  See #3  PRAFO  ?  Improving 16.  Neurogenic bladder  Continue ditropan qhs  17.  Right scapular pain  X-ray reviewed, unremarkable for fracture  Voltaren gel ordered on 1/13 18. GOC: Patient says she was not aware that she elected to be DNR and that she would like to reverse this decision. I discussed what DNR entails and she confirmed understanding and states that she desires resuscitation if she were to require it. I have d/ced DNR order.  LOS: 8 days A FACE TO FACE EVALUATION WAS PERFORMED  Martha Clan P Nadine Ryle 09/17/2019, 10:45 AM

## 2019-09-18 ENCOUNTER — Inpatient Hospital Stay (HOSPITAL_COMMUNITY): Payer: Medicare Other | Admitting: Physical Therapy

## 2019-09-18 ENCOUNTER — Inpatient Hospital Stay (HOSPITAL_COMMUNITY): Payer: Medicare Other | Admitting: Occupational Therapy

## 2019-09-18 DIAGNOSIS — M792 Neuralgia and neuritis, unspecified: Secondary | ICD-10-CM

## 2019-09-18 LAB — BASIC METABOLIC PANEL
Anion gap: 9 (ref 5–15)
BUN: 20 mg/dL (ref 8–23)
CO2: 23 mmol/L (ref 22–32)
Calcium: 8.9 mg/dL (ref 8.9–10.3)
Chloride: 105 mmol/L (ref 98–111)
Creatinine, Ser: 0.98 mg/dL (ref 0.44–1.00)
GFR calc Af Amer: >60 mL/min (ref >60)
GFR calc non Af Amer: 58 mL/min — ABNORMAL LOW (ref >60)
Glucose, Bld: 108 mg/dL — ABNORMAL HIGH (ref 70–99)
Potassium: 4.2 mmol/L (ref 3.5–5.1)
Sodium: 137 mmol/L (ref 135–145)

## 2019-09-18 LAB — GLUCOSE, CAPILLARY
Glucose-Capillary: 109 mg/dL — ABNORMAL HIGH (ref 70–99)
Glucose-Capillary: 90 mg/dL (ref 70–99)
Glucose-Capillary: 107 mg/dL — ABNORMAL HIGH (ref 70–99)

## 2019-09-18 MED ORDER — PREGABALIN 50 MG PO CAPS
50.0000 mg | ORAL_CAPSULE | Freq: Two times a day (BID) | ORAL | Status: DC
  Administered 2019-09-18 – 2019-09-30 (×24): 50 mg via ORAL
  Filled 2019-09-18 (×24): qty 1

## 2019-09-18 NOTE — Plan of Care (Signed)
  Problem: Consults Goal: RH GENERAL PATIENT EDUCATION Description: See Patient Education module for education specifics. Outcome: Progressing Goal: Nutrition Consult-if indicated Outcome: Progressing   Problem: RH BOWEL ELIMINATION Goal: RH STG MANAGE BOWEL WITH ASSISTANCE Description: STG Manage Bowel with mod Assistance.  Outcome: Progressing   Problem: RH BLADDER ELIMINATION Goal: RH STG MANAGE BLADDER WITH ASSISTANCE Description: STG Manage Bladder With mod Assistance Outcome: Progressing   Problem: RH SKIN INTEGRITY Goal: RH STG SKIN FREE OF INFECTION/BREAKDOWN Description: Patients skin will remain free from further infection or breakdown with min assist. Outcome: Progressing   Problem: RH SAFETY Goal: RH STG ADHERE TO SAFETY PRECAUTIONS W/ASSISTANCE/DEVICE Description: STG Adhere to Safety Precautions With min Assistance/Device. Outcome: Progressing   Problem: RH PAIN MANAGEMENT Goal: RH STG PAIN MANAGED AT OR BELOW PT'S PAIN GOAL Description: < 3 Outcome: Progressing

## 2019-09-18 NOTE — Progress Notes (Signed)
Physical Therapy Session Note  Patient Details  Name: Renee [name] MRN: [id number] Date of Birth: 11/01/1945  Today's Date: 09/18/2019 PT Individual Time: 0805-0903 PT Individual Time Calculation (min): 58 min   Short Term Goals: Week 2:  PT Short Term Goal 1 (Week 2): Pt will perform supine<>sit with CGA PT Short Term Goal 2 (Week 2): Pt will perform bed<>chair transfer with min assist PT Short Term Goal 3 (Week 2): Pt will ambulate at least 12ft using LRAD with mod assist of 1 and +2 w/c follow PT Short Term Goal 4 (Week 2): Pt will perform at least 85ft of w/c propulsion with min assist  Skilled Therapeutic Interventions/Progress Updates:    Pt received supine in bed and agreeable to therapy session. Reports rectal pain, unrated, but having received medication approximately 20 minutes prior. Supine>sit with min assist primarily for R LE management. Sitting EOB therapist donned B shoes max assist for time management - cuing for improved trunk control as pt having posterior lean. R squat pivot EOB>w/c with mod assist for lifting/pivoting hips - cuing for head/hips relationship and proper UE positioning.  Transported to/from gym in w/c. Therapist donned ACE wrap with cross in the back to facilitate decreased R knee hyperextension during stance phase of gait (knee cage not available at this time). Donned litegait harness in standing with BUE support and min assist for balance. While in litegait harness stepped onto treadmill with mod assist for balance and max sequential cuing.  Performed the following locomotor treadmill training trials using B UE support and litegait harness providing partial body weight support as well as therapist providing mod/max manual facilitation for R LE gait mechanics: 1st trail: 53min42sec at 0.62mph totaling 32ft with therapist providing max sequential cuing for stepping and mod/max assist for R LE gait mechanics to increase foot clearance, increase step length, promote  heel strike on initial contact, and facilitate lateral weight shifting during stance 2nd trial: 50min45sec at 0.41mph totaling 27ft with therapist again providing the above manual facilitation and cuing Patient reports that the harness feels uncomfortable to her and makes it feel like she has a hard time moving. Therapist educated pt on purpose of litegait harness and treadmill to promote increased repetition of gait mechanics in a safe environment. Standing in litegait harness to sitting in w/c. Doffed harness in sitting. Transported back to room in w/c. L squat pivot w/c>EOB using bedrail with min assist. Sit>suine with min assist primarily for R LE management. Pt left supine in bed with needs in reach and bed alarm on.  Therapy Documentation Precautions:  Precautions Precautions: Fall Precaution Comments: Significant weakness at RLE, impaired R UE proprioception Restrictions Weight Bearing Restrictions: No  Pain: Reports R scapular pain - RN notified and all medication had been provided - therapist provided rest breaks and repositioning for pain management.   Therapy/Group: Individual Therapy  Tawana Scale, PT, DPT 09/18/2019, 7:46 AM

## 2019-09-18 NOTE — Evaluation (Signed)
Recreational Therapy Assessment and Plan  Patient Details  Name: Renee [name] MRN: [id number] Date of Birth: 11-13-1945 Today's Date: 09/18/2019  Rehab Potential:  Good ELOS:   d/c 1/27 Assessment Problem List:      Patient Active Problem List   Diagnosis Date Noted  . Mass of parietal lobe 09/09/2019  . Spastic hemiparesis ([place])   . Iron deficiency anemia   . Drug induced constipation   . Steroid-induced hyperglycemia   . Prolonged Q-T interval on ECG   . Low grade glioma of brain ([place])   . 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 Right sided weakness 09/03/2019  . Constipation 07/08/2019  . GERD (gastroesophageal reflux disease) 07/08/2019  . History of breast cancer 12/27/2017  . Chronic renal disease, stage 3, moderately decreased glomerular filtration rate between 30-59 mL/min/1.73 square meter 12/27/2017  . CLL (chronic lymphocytic leukemia) ([place]) 02/21/2017  . Chronic depression 05/14/2015  . Rectal pain, chronic 02/10/2015  . Chronic pain 12/28/2013  . 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 Hypothyroidism 01/07/2013  . Vitamin D deficiency 01/07/2013    Past Medical History:      Past Medical History:  Diagnosis Date  . Chronic renal disease, stage 3, moderately decreased glomerular filtration rate between 30-59 mL/min/1.73 square meter 12/27/2017  . Constipation   . Depression   . History of breast cancer 12/27/2017  . Hypertension   . Leukemia ([place])    CLL  . PVD (peripheral vascular disease) ([place])   . Thyroid disease    Past Surgical History:       Past Surgical History:  Procedure Laterality Date  . ABDOMINAL HYSTERECTOMY    . BREAST SURGERY     Left Breast Lumpectomy  . COLON SURGERY    . HERNIA REPAIR  2000   Hiatal hernia repair      Assessment & Plan Clinical Impression:Renee [name] is a 74 year old female with history of CKD, HTN, CLL, breast cancer s/p lumpectomy with XRT, pudendal  neuralgia, anxiety disorder who was admitted on 09/04/19 with 2 to 3-week history of multiple falls with progressive weakness affecting FMC of right hand and right foot drop. MRI brain done showing 2 cm cystic lesion with vasogenic edema and question of tumor versus cerebritis. She was transferred to [facility] for further work-up and evaluated by neurology. She was started on Decadron and MRI brain with contrast showed 2 cm subcortical white matter lesion in parietal lobe without enhancement--cerebritis or subacute infarct felt to be unlikely. LP done showing elevated glucose with 1 WBC and 89 RBC. CSF sent for flow cytometry and JC virus. Dr. Malen Gauze recommends biopsy with PET scan and Dr. Mickeal Skinner consulted for input and felt that CNS involvement and CLL was very uncommon. He question nonneoplastic etiology such as vascular inflammatory process and recommend repeating contrast enhanced MRI in 1 month as well as decreasing Decadron to 4 mg daily at discharge. Patient to be discussed at tumor board on 1/11. Aggressive rehab recommended due to ongoing right-sided weakness affecting mobility and ADLs. CIR recommended due to functional decline. Patient transferred to CIR on 09/09/2019 .    Pt presents with decreased activity tolerance, decreased functional mobility, decreased balance, decreased coordination, decreased problem solving, decreased safety awareness feelings of stress/anxiety Limiting pt's independence with leisure/community pursuits.   Plan Min 1 TR session >20 minutes each week during LOS Recommendations for other services: None   Discharge Criteria: Patient will be discharged from TR if patient refuses treatment  3 consecutive times without medical reason.  If treatment goals not met, if there is a change in medical status, if patient makes no progress towards goals or if patient is discharged from hospital.  The above assessment, treatment plan, treatment alternatives and goals were discussed and  mutually agreed upon: by patient  [place] 09/18/2019, 10:44 AM

## 2019-09-18 NOTE — Progress Notes (Signed)
Physical Therapy Session Note  Patient Details  Name: Renee [name] MRN: [id number] Date of Birth: 1946-06-01  Today's Date: 09/18/2019 PT Individual Time: 1301-1400 PT Individual Time Calculation (min): 59 min   Short Term Goals: Week 2:  PT Short Term Goal 1 (Week 2): Pt will perform supine<>sit with CGA PT Short Term Goal 2 (Week 2): Pt will perform bed<>chair transfer with min assist PT Short Term Goal 3 (Week 2): Pt will ambulate at least 74ft using LRAD with mod assist of 1 and +2 w/c follow PT Short Term Goal 4 (Week 2): Pt will perform at least 51ft of w/c propulsion with min assist  Skilled Therapeutic Interventions/Progress Updates: Pt presents supine in bed, agreeable to PT.  performed     Therapy Documentation Precautions:  Precautions Precautions: Fall Precaution Comments: Significant weakness at RLE, impaired R UE proprioception Restrictions Weight Bearing Restrictions: No General:   Vital Signs:  Pain: Pt states no pain at initiation of treatment when supine in bed, but increased to 7/10 w/ unsupported position when OOB.  Pt transferred sup to sit w/ min A and scooted to EOB .  Assist to don bilateral shoes w/o LOB.  Pt performed multiple modified SPT w/c <> bed, w/c <>mat table, w/c <> Nu-step w/ mod A to left w/ verbal and visual cues for hand placement.  Pt performed sitting reaching for pegs on side of mat table, forward and down, crossing midline.  Pt performed Nu-step x 15 (5' x 3 trials) on Level 1 to improve right LE strength.  Pt performed w/o right UE and then w/o bilateral UEs.  Pt returned to bed w/ min A for LEs.  All needs within reach and bed alarm on before exiting room.  Therapy/Group: Individual Therapy  Ladoris Gene 09/18/2019, 3:17 PM

## 2019-09-18 NOTE — Progress Notes (Signed)
Occupational Therapy Session Note  Patient Details  Name: Renee [name] MRN: [id number] Date of Birth: [birth year]  Today's Date: 09/18/2019 OT Individual Time: 1030-1130 OT Individual Time Calculation (min): 60 min    Short Term Goals: Week 2:  OT Short Term Goal 1 (Week 2): Pt will complete squat pivot transfers to toilet with min A. OT Short Term Goal 2 (Week 2): Pt will be able to self cleanse post toileting with CGA to support balance. OT Short Term Goal 3 (Week 2): Pt will demonstrate improved dynamic sit balance to don pants over feet with min A. OT Short Term Goal 4 (Week 2): Pt will demonstrate lateral leans with min A to pull pants up/down hips with mod A.  Skilled Therapeutic Interventions/Progress Updates:    Pt received in bed agreeable to therapy. She did not want to bathe or change clothing today but did want to brush teeth.   Worked on bed mobility and squat pivot transfer to wc to R with mod-max A to adjust hips in cushion of chair.  Completed oral care with set up.  Pt continues to c/o R shoulder blade pain describing a tense feeling behind her shoulder blade. Introduced her to the theracane for deep pressure but she did not like the way it felt.    Pt taken to the gym to work on [employer] for RUE and trunk control/ balance.  Used cybex arm bike for bilateral integration and controlled movement patterns of RUE.  Pt able to use this with more control and even able to push the arm bike with R arm only.  RUE grasping and reaching with stacking cones with reaching across her midline having to integrate a torso twist to grasp cone and then placing cone outside of her hip base of support to her R with dynamic reach and trunk elongation to the R.  Pt did well with the exercise demonstrating improved R side coordination.  Pt taken back to room and completed squat pivot to bed to L with mod A, mod A to get adjusted into bed.   Pt in bed with all needs met, alarm set.   Therapy  Documentation Precautions:  Precautions Precautions: Fall Precaution Comments: Significant weakness at RLE, impaired R UE proprioception Restrictions Weight Bearing Restrictions: No  Pain: Pain Assessment Pain Scale: 0-10 Pain Score: 4  Pain Type: Chronic pain Pain Location: Shoulder Pain Orientation: Right Pain Descriptors / Indicators: Aching;Discomfort Pain Onset: On-going Patients Stated Pain Goal: 0 Pain Intervention(s): Medication (See eMAR) ADL: ADL Eating: Set up Where Assessed-Eating: Bed level Grooming: Setup Where Assessed-Grooming: Sitting at sink Upper Body Bathing: Contact guard Where Assessed-Upper Body Bathing: Edge of bed Lower Body Bathing: Moderate assistance Where Assessed-Lower Body Bathing: Edge of bed, Bed level Upper Body Dressing: Minimal assistance Where Assessed-Upper Body Dressing: Edge of bed Lower Body Dressing: Maximal assistance Where Assessed-Lower Body Dressing: Edge of bed, Bed level Toileting: Maximal assistance Where Assessed-Toileting: Glass blower/designer: Moderate assistance Toilet Transfer Method: Squat pivot Toilet Transfer Equipment: Grab bars, Raised toilet seat   Therapy/Group: Individual Therapy  [place] 09/18/2019, 11:15 AM

## 2019-09-18 NOTE — Progress Notes (Signed)
[place] PHYSICAL MEDICINE & REHABILITATION PROGRESS NOTE  Subjective/Complaints: Patient seen sitting up in bed this morning.  She states she slept well overnight, but complains of some rectal pain.  She notes improvement in scapular pain.  She has questions regarding bowel meds.  ROS: Denies CP, SOB, N/V/D  Objective: Vital Signs: Blood pressure (!) 93/50, pulse (!) 54, temperature 97.9 F (36.6 C), temperature source Oral, resp. rate 16, weight 78.9 kg, SpO2 98 %. No results found. Recent Labs    09/16/19 0459  WBC 85.9*  HGB 10.4*  HCT 34.4*  PLT 253   Recent Labs    09/17/19 0504 09/18/19 0453  NA 139 137  K 4.6 4.2  CL 107 105  CO2 26 23  GLUCOSE 118* 108*  BUN 22 20  CREATININE 1.22* 0.98  CALCIUM 8.7* 8.9    Physical Exam: BP (!) 93/50 (BP Location: Left Arm)   Pulse (!) 54   Temp 97.9 F (36.6 C) (Oral)   Resp 16   Wt 78.9 kg   SpO2 98%   BMI 22.95 kg/m  Constitutional: No distress . Vital signs reviewed. HENT: Normocephalic.  Atraumatic. Eyes: EOMI. No discharge. Cardiovascular: No JVD. Respiratory: Normal effort.  No stridor. GI: Non-distended. Skin: Warm and dry.  Intact. Psych: Normal mood.  Normal behavior. Musc: No edema in extremities.  No tenderness in extremities. Neuro: Alert Motor:  RUE: 4+/5 proximal to distal RLE: HF, KE 3 -/5, ADF 2/5, stable Increased tone right ankle, ?improving  Assessment/Plan: 1. 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  Physiatrist is providing close team supervision and 24 hour management of active medical problems listed below.  Physiatrist and rehab team continue to assess barriers to discharge/monitor patient progress toward functional and medical goals  Care Tool:  Bathing  Bathing activity did not occur: Refused Body parts bathed by patient: Right arm, Left arm, Chest,  Abdomen, Right upper leg, Left upper leg   Body parts bathed by helper: Right lower leg, Left lower leg Body parts n/a: Front perineal area, Buttocks   Bathing assist Assist Level: Moderate Assistance - Patient 50 - 74%     Upper Body Dressing/Undressing Upper body dressing   What is the patient wearing?: Pull over shirt    Upper body assist Assist Level: Minimal Assistance - Patient > 75%    Lower Body Dressing/Undressing Lower body dressing      What is the patient wearing?: Incontinence brief, Pants     Lower body assist Assist for lower body dressing: Moderate Assistance - Patient 50 - 74%     Toileting Toileting    Toileting assist Assist for toileting: Moderate Assistance - Patient 50 - 74%     Transfers Chair/bed transfer  Transfers assist     Chair/bed transfer assist level: Moderate Assistance - Patient 50 - 74%     Locomotion Ambulation   Ambulation assist      Assist level: Moderate Assistance - Patient 50 - 74% Assistive device: Parallel bars Max distance: 64ft   Walk 10 feet activity   Assist     Assist level: Moderate Assistance - Patient - 50 - 74% Assistive device: Parallel bars   Walk 50 feet activity   Assist Walk 50 feet with 2 turns activity did not occur: Safety/medical concerns         Walk 150 feet activity   Assist Walk 150 feet activity  did not occur: Safety/medical concerns         Walk 10 feet on uneven surface  activity   Assist Walk 10 feet on uneven surfaces activity did not occur: Safety/medical concerns         Wheelchair     Assist   Type of Wheelchair: Manual    Wheelchair assist level: Moderate Assistance - Patient 50 - 74% Max wheelchair distance: 50    Wheelchair 50 feet with 2 turns activity    Assist        Assist Level: Moderate Assistance - Patient 50 - 74%   Wheelchair 150 feet activity     Assist Wheelchair 150 feet activity did not occur: Safety/medical  concerns          Medical Problem List and Plan:  1. 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 Antithrombotics:   -DVT/anticoagulation: Pharmaceutical: Lovenox   -antiplatelet therapy: n/a  3. Chronic pudendal pain/Pain Management: Cymbalta  Continue methadone bid, afternoon dose added, changed dosing to 10 mg 3 times daily  Monitor with increased mobility  ?Rash allergy to Lyrica-tolerating retrial, increased on 1/15  Baclofen 5 TID started on 1/8, increased on 1/13  Added anusol supp HCT BID  Cont Tucks pad to anal area QID  Continue ice  Will consider further medication adjustments if necessary 4. Mood: LCSW to follow for evaluation and support.   -antipsychotic agents: N/A  5. Neuropsych: This patient is ? fully capable of making decisions on her own behalf.  6. Skin/Wound Care: Routine pressure relief measures.  7. 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 Improved.   Creatinine 0.98 on 1/15, labs ordered for Monday  Encourage fluids  Cont to monitor 9. Hypothyroid: Resume supplement.  10. 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  Recommendations to reduce decadron to 4 mg at discharge, will follow up on frequency.   On Keppra and decadron-->tapered to bid on 1/5  12. Steroid induced hyperglycemia: Hgb A1c-5.4.   Relatively controlled on 1/15  Monitor with increased mobility 13. OIC/?IBS: On multiple medications:  Laculose changed to PRN per patient preference  Senna decreased on 1/13  Linzess restarted per pt/family preference  14. Breast cancer in remission/CLL being observed:   WBCs 85.9 on 1/13, labs ordered for Monday.  Discussed with Neuro/Onc and Heme/Onc.  Per Heme/Onc cont to monitor and no cause for concern unless acute spike.  Afebrile  Cont to monitor 14. Iron  deficiently anemia: Resolved  Added iron supplement   Hb 10.4 on 10/13  Cont to monitor 15. Spasticity of R ankle  See #3  PRAFO  ?  Improving 16.  Neurogenic bladder  Continue ditropan qhs  17.  Right scapular pain  X-ray reviewed, unremarkable for fracture  Voltaren gel ordered on 1/13  Improving  LOS: 9 days A FACE TO FACE EVALUATION WAS PERFORMED  Quoc Tome Lorie Phenix 09/18/2019, 9:45 AM

## 2019-09-19 ENCOUNTER — Inpatient Hospital Stay (HOSPITAL_COMMUNITY): Payer: Medicare Other | Admitting: Physical Therapy

## 2019-09-19 ENCOUNTER — Inpatient Hospital Stay (HOSPITAL_COMMUNITY): Payer: Medicare Other | Admitting: Occupational Therapy

## 2019-09-19 DIAGNOSIS — I959 Hypotension, unspecified: Secondary | ICD-10-CM

## 2019-09-19 DIAGNOSIS — G894 Chronic pain syndrome: Secondary | ICD-10-CM

## 2019-09-19 LAB — GLUCOSE, CAPILLARY
Glucose-Capillary: 101 mg/dL — ABNORMAL HIGH (ref 70–99)
Glucose-Capillary: 102 mg/dL — ABNORMAL HIGH (ref 70–99)
Glucose-Capillary: 135 mg/dL — ABNORMAL HIGH (ref 70–99)

## 2019-09-19 NOTE — Progress Notes (Signed)
[place] PHYSICAL MEDICINE & REHABILITATION PROGRESS NOTE  Subjective/Complaints: Patient seen sitting up in bed this morning.  She states she slept fairly overnight because of rectal pain, that improved with medications.  She also notes she had a bowel movement yesterday.  ROS: Denies CP, SOB, N/V/D  Objective: Vital Signs: Blood pressure (!) 92/50, pulse (!) 51, temperature 97.9 F (36.6 C), resp. rate 18, weight 78.9 kg, SpO2 98 %. No results found. No results for input(s): WBC, HGB, HCT, PLT in the last 72 hours. Recent Labs    09/17/19 0504 09/18/19 0453  NA 139 137  K 4.6 4.2  CL 107 105  CO2 26 23  GLUCOSE 118* 108*  BUN 22 20  CREATININE 1.22* 0.98  CALCIUM 8.7* 8.9    Physical Exam: BP (!) 92/50 (BP Location: Left Arm)   Pulse (!) 51   Temp 97.9 F (36.6 C)   Resp 18   Wt 78.9 kg   SpO2 98%   BMI 22.95 kg/m  Constitutional: No distress . Vital signs reviewed. HENT: Normocephalic.  Atraumatic. Eyes: EOMI. No discharge. Cardiovascular: No JVD. Respiratory: Normal effort.  No stridor. GI: Non-distended. Skin: Warm and dry.  Intact. Psych: Normal mood.  Normal behavior. Musc: No edema in extremities.  No tenderness in extremities. Neuro: Alert Motor:  RUE: 4+/5 proximal to distal RLE: HF, KE 3 -/5, ADF 2/5, unchanged Increased tone right ankle, ?improving  Assessment/Plan: 1. 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  Physiatrist is providing close team supervision and 24 hour management of active medical problems listed below.  Physiatrist and rehab team continue to assess barriers to discharge/monitor patient progress toward functional and medical goals  Care Tool:  Bathing  Bathing activity did not occur: Refused Body parts bathed by patient: Right arm, Left arm, Chest, Abdomen, Right upper leg, Left upper leg   Body parts  bathed by helper: Right lower leg, Left lower leg Body parts n/a: Front perineal area, Buttocks   Bathing assist Assist Level: Moderate Assistance - Patient 50 - 74%     Upper Body Dressing/Undressing Upper body dressing   What is the patient wearing?: Pull over shirt    Upper body assist Assist Level: Minimal Assistance - Patient > 75%    Lower Body Dressing/Undressing Lower body dressing      What is the patient wearing?: Incontinence brief, Pants     Lower body assist Assist for lower body dressing: Moderate Assistance - Patient 50 - 74%     Toileting Toileting    Toileting assist Assist for toileting: Moderate Assistance - Patient 50 - 74%     Transfers Chair/bed transfer  Transfers assist     Chair/bed transfer assist level: Moderate Assistance - Patient 50 - 74%     Locomotion Ambulation   Ambulation assist      Assist level: Total Assistance - Patient < 25% Assistive device: Lite Gait Max distance: 58ft   Walk 10 feet activity   Assist     Assist level: Total Assistance - Patient < 25% Assistive device: Lite Gait   Walk 50 feet activity   Assist Walk 50 feet with 2 turns activity did not occur: Safety/medical concerns  Assist level: Total Assistance - Patient < 25% Assistive device: Lite Gait    Walk 150 feet activity   Assist Walk 150 feet activity did not occur: Safety/medical concerns  Walk 10 feet on uneven surface  activity   Assist Walk 10 feet on uneven surfaces activity did not occur: Safety/medical concerns         Wheelchair     Assist   Type of Wheelchair: Manual    Wheelchair assist level: Moderate Assistance - Patient 50 - 74% Max wheelchair distance: 50    Wheelchair 50 feet with 2 turns activity    Assist        Assist Level: Moderate Assistance - Patient 50 - 74%   Wheelchair 150 feet activity     Assist Wheelchair 150 feet activity did not occur: Safety/medical  concerns          Medical Problem List and Plan:  1. 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 Antithrombotics:   -DVT/anticoagulation: Pharmaceutical: Lovenox   -antiplatelet therapy: n/a  3. Chronic pudendal pain/Pain Management: Cymbalta  Continue methadone bid, afternoon dose added, changed dosing to 10 mg 3 times daily  Monitor with increased mobility  ?Rash allergy to Lyrica-tolerating retrial, increased on 1/15  Baclofen 5 TID started on 1/8, increased on 1/13  Added anusol supp HCT BID  Cont Tucks pad to anal area QID  Continue ice  Relatively controlled with medications on 1/16  Will consider further medication adjustments if necessary 4. Mood: LCSW to follow for evaluation and support.   -antipsychotic agents: N/A  5. Neuropsych: This patient is ? fully capable of making decisions on her own behalf.  6. Skin/Wound Care: Routine pressure relief measures.  7. 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 Improved.   Creatinine 0.98 on 1/15, labs ordered for Monday  Encourage fluids  Cont to monitor 9. Hypothyroid: Resume supplement.  10. 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  Recommendations to reduce decadron to 4 mg at discharge, will follow up on frequency.   On Keppra and decadron-->tapered to bid on 1/5  12. Steroid induced hyperglycemia: Hgb A1c-5.4.   Controlled on 1/16  Monitor with increased mobility 13. OIC/?IBS: On multiple medications:  Laculose changed to PRN per patient preference  Senna decreased on 1/13  Linzess restarted per pt/family preference   Improving 14. Breast cancer in remission/CLL being observed:   WBCs 85.9 on 1/13, labs ordered for Monday.  Discussed with Neuro/Onc and Heme/Onc.  Per Heme/Onc cont to monitor and no cause for concern unless acute  spike.  Afebrile  Cont to monitor 14. Iron deficiently anemia: Resolved  Added iron supplement   Hb 10.4 on 10/13, labs ordered for Monday  Cont to monitor 15. Spasticity of R ankle  See #3  PRAFO  ?  Improving 16.  Neurogenic bladder  Continue ditropan qhs  17.  Right scapular pain  X-ray reviewed, unremarkable for fracture  Voltaren gel ordered on 1/13  Improving 18.  Hypotension  Asymptomatic at present  Labile/soft on 1/16    LOS: 10 days A FACE TO FACE EVALUATION WAS PERFORMED  Daxten Kovalenko Lorie Phenix 09/19/2019, 12:19 PM

## 2019-09-19 NOTE — Plan of Care (Signed)
  Problem: Consults Goal: RH GENERAL PATIENT EDUCATION Description: See Patient Education module for education specifics. Outcome: Progressing Goal: Nutrition Consult-if indicated Outcome: Progressing   Problem: RH BOWEL ELIMINATION Goal: RH STG MANAGE BOWEL WITH ASSISTANCE Description: STG Manage Bowel with mod Assistance.  Outcome: Progressing   Problem: RH BLADDER ELIMINATION Goal: RH STG MANAGE BLADDER WITH ASSISTANCE Description: STG Manage Bladder With mod Assistance Outcome: Progressing   Problem: RH SKIN INTEGRITY Goal: RH STG SKIN FREE OF INFECTION/BREAKDOWN Description: Patients skin will remain free from further infection or breakdown with min assist. Outcome: Progressing   Problem: RH SAFETY Goal: RH STG ADHERE TO SAFETY PRECAUTIONS W/ASSISTANCE/DEVICE Description: STG Adhere to Safety Precautions With min Assistance/Device. Outcome: Progressing   Problem: RH PAIN MANAGEMENT Goal: RH STG PAIN MANAGED AT OR BELOW PT'S PAIN GOAL Description: < 3 Outcome: Progressing

## 2019-09-19 NOTE — Progress Notes (Signed)
Occupational Therapy Session Note  Patient Details  Name: Renee [name] MRN: [id number] Date of Birth: [birth year]  Today's Date: 09/19/2019 OT Individual Time: 0942-1000 OT Individual Time Calculation (min): 18 min    Short Term Goals: Week 2:  OT Short Term Goal 1 (Week 2): Pt will complete squat pivot transfers to toilet with min A. OT Short Term Goal 2 (Week 2): Pt will be able to self cleanse post toileting with CGA to support balance. OT Short Term Goal 3 (Week 2): Pt will demonstrate improved dynamic sit balance to don pants over feet with min A. OT Short Term Goal 4 (Week 2): Pt will demonstrate lateral leans with min A to pull pants up/down hips with mod A.  Skilled Therapeutic Interventions/Progress Updates:    Treatment session with focus on trunk control and functional use of dominant RUE.  Pt received slumped down in hospital bed reporting pain in rectum.  Pt agreeable to short therapy session.  Completed bed mobility with min-mod assist to come to sitting at EOB.  Engaged in functional mobility with RUE to address ataxic movements, incorporating reaching across midline and utilizing RUE to open lids on various items.  Pt reporting arm is "worse" the last few days and voiced frustration in using her phone.  Engaged in reaching and utilizing RUE to select most used apps on phone.  Educated on use of microphone for texting to increase success.  Pt reporting pain increasing in rectum, therefore returned to semi-reclined in bed and left with all needs in reach.  Therapy Documentation Precautions:  Precautions Precautions: Fall Precaution Comments: Significant weakness at RLE, impaired R UE proprioception Restrictions Weight Bearing Restrictions: No Pain:  Pt with c/o rectal pain.  Repositioned.   Therapy/Group: Individual Therapy  Simonne Come 09/19/2019, 1:00 PM

## 2019-09-19 NOTE — Progress Notes (Signed)
Physical Therapy Session Note  Patient Details  Name: Kamille Campus MRN: [id number] Date of Birth: 1946-02-23  Today's Date: 09/19/2019 PT Individual Time: [id number] PT Individual Time Calculation (min): 53 min   Short Term Goals: Week 2:  PT Short Term Goal 1 (Week 2): Pt will perform supine<>sit with CGA PT Short Term Goal 2 (Week 2): Pt will perform bed<>chair transfer with min assist PT Short Term Goal 3 (Week 2): Pt will ambulate at least 12ft using LRAD with mod assist of 1 and +2 w/c follow PT Short Term Goal 4 (Week 2): Pt will perform at least 28ft of w/c propulsion with min assist  Skilled Therapeutic Interventions/Progress Updates:    Pt received supine in bed and agreeable to therapy session. Supine>sitting EOB, HOB slightly elevated and using bedrail with min/mod assist for R LE management and trunk upright with pt having increased difficulty bringing trunk to midline. Performed bed mobility retraining. Sit>supine with mod assist for B LE management then performed supine>sit again with focus on cuing for R LE management and using L UE to push up trunk to midline for increased independence though continuing to require min/mod assist. R squat pivot EOB>w/c with mod assist for lifting/pivoting hips and continued cuing for hand placement and head/hips relationship.  Transported to/from gym in w/c. Block practice squat pivot transfers w/c<>EOM targeting increased pt recall of hand placement and increased independence with lifting and pivoting hips though pt continued to require mod assist and min cuing. Sitting EOM pt continues to demonstrate R anterolateral trunk LOB without L UE support. Participated in seated trunk control task of reaching outside BOS to grasp cone with L UE with pt continue to have R trunk LOB requiring max assist to maintain upright. Transitioned to R lateral trunk lean onto forearm support to grasp cup with L hand and return to midline to place cup on her L side  posteriorly to promote L posterolateral pelvic weight shift - continued to require max assist for R UE positioning for forearm support and trunk control to prevent R LOB but able to return to midline with CGA and intermittent min assist. R squat pivot back to w/c with mod assist as described above. Sit<>stand in // bars with pt attempting to come to standing with L UE support on therapist's shoulders as she has done previously with cuing for L anterior trunk weight shift but despite max assist pt unable to come to stand - transitioned to L hand support on // bar and pt able to pull up into standing with mod assist. In standing participated in standing balance targeting midline orientation while participating in card matching game - therapist blocking R knee buckle or hyperextension while manually facilitating neutral pelvic alignment. Transported back to room in w/c. L squat pivot w/c>EOB using bedrail for UE support with min/mod assist. Sit>supine with mod assist for B LE management. Pt left supine in bed with needs in reach and bed alarm on.  Therapy Documentation Precautions:  Precautions Precautions: Fall Precaution Comments: Significant weakness at RLE, impaired R UE proprioception Restrictions Weight Bearing Restrictions: No  Pain: Reports some rectal pain but does not interfere with therapy session.    Therapy/Group: Individual Therapy  Tawana Scale, PT, DPT 09/19/2019, 12:57 PM

## 2019-09-20 ENCOUNTER — Inpatient Hospital Stay (HOSPITAL_COMMUNITY): Payer: Medicare Other

## 2019-09-20 DIAGNOSIS — R7309 Other abnormal glucose: Secondary | ICD-10-CM | POA: Insufficient documentation

## 2019-09-20 DIAGNOSIS — G5621 Lesion of ulnar nerve, right upper limb: Secondary | ICD-10-CM

## 2019-09-20 LAB — GLUCOSE, CAPILLARY
Glucose-Capillary: 84 mg/dL (ref 70–99)
Glucose-Capillary: 91 mg/dL (ref 70–99)
Glucose-Capillary: 143 mg/dL — ABNORMAL HIGH (ref 70–99)
Glucose-Capillary: 150 mg/dL — ABNORMAL HIGH (ref 70–99)

## 2019-09-20 NOTE — Plan of Care (Signed)
  Problem: Consults Goal: RH GENERAL PATIENT EDUCATION Description: See Patient Education module for education specifics. Outcome: Progressing Goal: Nutrition Consult-if indicated Outcome: Progressing   Problem: RH BOWEL ELIMINATION Goal: RH STG MANAGE BOWEL WITH ASSISTANCE Description: STG Manage Bowel with mod Assistance.  Outcome: Progressing   Problem: RH BLADDER ELIMINATION Goal: RH STG MANAGE BLADDER WITH ASSISTANCE Description: STG Manage Bladder With mod Assistance Outcome: Progressing   Problem: RH SKIN INTEGRITY Goal: RH STG SKIN FREE OF INFECTION/BREAKDOWN Description: Patients skin will remain free from further infection or breakdown with min assist. Outcome: Progressing   Problem: RH SAFETY Goal: RH STG ADHERE TO SAFETY PRECAUTIONS W/ASSISTANCE/DEVICE Description: STG Adhere to Safety Precautions With min Assistance/Device. Outcome: Progressing   Problem: RH PAIN MANAGEMENT Goal: RH STG PAIN MANAGED AT OR BELOW PT'S PAIN GOAL Description: < 3 Outcome: Progressing

## 2019-09-20 NOTE — Progress Notes (Signed)
Physical Therapy Session Note  Patient Details  Name: Renee [name] MRN: [id number] Date of Birth: 1946/03/10  Today's Date: 09/20/2019 PT Individual Time: 1000-1030 PT Individual Time Calculation (min): 30 min    Short Term Goals: Week 2:  PT Short Term Goal 1 (Week 2): Pt will perform supine<>sit with CGA PT Short Term Goal 2 (Week 2): Pt will perform bed<>chair transfer with min assist PT Short Term Goal 3 (Week 2): Pt will ambulate at least 15ft using LRAD with mod assist of 1 and +2 w/c follow PT Short Term Goal 4 (Week 2): Pt will perform at least 23ft of w/c propulsion with min assist  Skilled Therapeutic Interventions/Progress Updates:    Pt supine in bed upon PT arrival, agreeable to therapy tx and reports R shoulder pain and buttocks pain. Pt asking if her lidocaine patch could be donned during the day instead of at night, therapist communicated this request to RN (Dauda). Therapist donned shoes total assist. Supine>sitting mod assist, cues for techniques. Squat pivot to w/c towards the L with min assist, cues for techniques and hand placement to reach across for arm rest with L UE. Pt transported to the gym. Pt performed x 4 sit<>stands this session within parallel bars with mod assist. In standing worked on trying to bend R knee to limit hyperextension, also worked on stepping in place with each LE. Pt transported back to room and transferred back to bed min assist squat pivot. Sit>supine mod assist for LE management, left with needs in reach and bed alarm set.   Therapy Documentation Precautions:  Precautions Precautions: Fall Precaution Comments: Significant weakness at RLE, impaired R UE proprioception Restrictions Weight Bearing Restrictions: No    Therapy/Group: Individual Therapy  Netta Corrigan, PT, DPT, CSRS 09/20/2019, 7:59 AM

## 2019-09-20 NOTE — Progress Notes (Signed)
[facility] PHYSICAL MEDICINE & REHABILITATION PROGRESS NOTE  Subjective/Complaints: Patient seen laying in bed this AM.  She states she slept well overnight.  She states she woke up with numbness in her fourth and fifth digit.  Discussed positioning with pressure on ulnar nerve.  ROS: Denies CP, SOB, N/V/D  Objective: Vital Signs: Blood pressure (!) 91/53, pulse 68, temperature 98.4 F (36.9 C), temperature source Oral, resp. rate 16, weight 78.9 kg, SpO2 99 %. No results found. No results for input(s): WBC, HGB, HCT, PLT in the last 72 hours. Recent Labs    09/18/19 0453  NA 137  K 4.2  CL 105  CO2 23  GLUCOSE 108*  BUN 20  CREATININE 0.98  CALCIUM 8.9    Physical Exam: BP (!) 91/53 (BP Location: Left Arm)   Pulse 68   Temp 98.4 F (36.9 C) (Oral)   Resp 16   Wt 78.9 kg   SpO2 99%   BMI 22.95 kg/m  Constitutional: No distress . Vital signs reviewed. HENT: Normocephalic.  Atraumatic. Eyes: EOMI. No discharge. Cardiovascular: No JVD. Respiratory: Normal effort.  No stridor. GI: Non-distended. Skin: Warm and dry.  Intact. Psych: Normal mood.  Normal behavior. Musc: No edema in extremities.  No tenderness in extremities. Neuro: Alert Motor:  RUE: 4+/5 proximal to distal, unchanged Subjective decrease sensation on fourth and fifth digit RLE: HF, KE 3 -/5, ADF 2/5, stable Increased tone right ankle, ?improving  Assessment/Plan: 1. 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  Physiatrist is providing close team supervision and 24 hour management of active medical problems listed below.  Physiatrist and rehab team continue to assess barriers to discharge/monitor patient progress toward functional and medical goals  Care Tool:  Bathing  Bathing activity did not occur: Refused Body parts bathed by patient: Right arm, Left arm, Chest, Abdomen,  Right upper leg, Left upper leg   Body parts bathed by helper: Right lower leg, Left lower leg Body parts n/a: Front perineal area, Buttocks   Bathing assist Assist Level: Moderate Assistance - Patient 50 - 74%     Upper Body Dressing/Undressing Upper body dressing   What is the patient wearing?: Pull over shirt    Upper body assist Assist Level: Minimal Assistance - Patient > 75%    Lower Body Dressing/Undressing Lower body dressing      What is the patient wearing?: Incontinence brief, Pants     Lower body assist Assist for lower body dressing: Moderate Assistance - Patient 50 - 74%     Toileting Toileting    Toileting assist Assist for toileting: Moderate Assistance - Patient 50 - 74%     Transfers Chair/bed transfer  Transfers assist     Chair/bed transfer assist level: Minimal Assistance - Patient > 75%(squat pivot to L)     Locomotion Ambulation   Ambulation assist      Assist level: Total Assistance - Patient < 25% Assistive device: Lite Gait Max distance: 106ft   Walk 10 feet activity   Assist     Assist level: Total Assistance - Patient < 25% Assistive device: Lite Gait   Walk 50 feet activity   Assist Walk 50 feet with 2 turns activity did not occur: Safety/medical concerns  Assist level: Total Assistance - Patient < 25% Assistive device: Lite Gait    Walk 150 feet activity   Assist Walk 150 feet activity did not occur:  Safety/medical concerns         Walk 10 feet on uneven surface  activity   Assist Walk 10 feet on uneven surfaces activity did not occur: Safety/medical concerns         Wheelchair     Assist   Type of Wheelchair: Manual    Wheelchair assist level: Moderate Assistance - Patient 50 - 74% Max wheelchair distance: 50    Wheelchair 50 feet with 2 turns activity    Assist        Assist Level: Moderate Assistance - Patient 50 - 74%   Wheelchair 150 feet activity     Assist  Wheelchair 150 feet activity did not occur: Safety/medical concerns          Medical Problem List and Plan:  1. 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 Antithrombotics:   -DVT/anticoagulation: Pharmaceutical: Lovenox   -antiplatelet therapy: n/a  3. Chronic pudendal pain/Pain Management: Cymbalta  Continue methadone bid, afternoon dose added, changed dosing to 10 mg 3 times daily  Monitor with increased mobility  ?Rash allergy to Lyrica-tolerating retrial, increased on 1/15  Baclofen 5 TID started on 1/8, increased on 1/13  Added anusol supp HCT BID  Cont Tucks pad to anal area QID  Continue ice  Relatively controlled with medications on 1/17  Will consider further medication adjustments if necessary 4. Mood: LCSW to follow for evaluation and support.   -antipsychotic agents: N/A  5. Neuropsych: This patient is ? fully capable of making decisions on her own behalf.  6. Skin/Wound Care: Routine pressure relief measures.  7. 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 Improved.   Creatinine 0.98 on 1/15, labs ordered for tomorrow  Encourage fluids  Cont to monitor 9. Hypothyroid: Resume supplement.  10. 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  Recommendations to reduce decadron to 4 mg at discharge, will follow up on frequency.   On Keppra and decadron-->tapered to bid on 1/5  12. Steroid induced hyperglycemia: Hgb A1c-5.4.   Labile on 1/17  Monitor with increased mobility 13. OIC/?IBS: On multiple medications:  Laculose changed to PRN per patient preference  Senna decreased on 1/13  Linzess restarted per pt/family preference   Improving 14. Breast cancer in remission/CLL being observed:   WBCs 85.9 on 1/13,  labs ordered for tomorrow.  Discussed with Neuro/Onc and Heme/Onc.  Per Heme/Onc cont to monitor and no cause for concern unless acute spike.  Afebrile  Cont to monitor 14. Iron deficiently anemia: Resolved  Added iron supplement   Hb 10.4 on 10/13, labs ordered for tomorrow  Cont to monitor 15. Spasticity of R ankle  See #3  PRAFO  ?  Improving 16.  Neurogenic bladder  Continue ditropan qhs  17.  Right scapular pain  X-ray reviewed, unremarkable for fracture  Voltaren gel ordered on 1/13  Improving 18.  Hypotension  Asymptomatic at present  Labile/soft on 1/17    LOS: 11 days A FACE TO FACE EVALUATION WAS PERFORMED  [name] 09/20/2019, 8:47 PM

## 2019-09-21 ENCOUNTER — Inpatient Hospital Stay (HOSPITAL_COMMUNITY): Payer: Medicare Other

## 2019-09-21 ENCOUNTER — Inpatient Hospital Stay (HOSPITAL_COMMUNITY): Payer: Medicare Other | Admitting: Occupational Therapy

## 2019-09-21 LAB — CBC
HCT: 34.3 % — ABNORMAL LOW (ref 36.0–46.0)
Hemoglobin: 10.3 g/dL — ABNORMAL LOW (ref 12.0–15.0)
MCH: 26.3 pg (ref 26.0–34.0)
MCHC: 30 g/dL (ref 30.0–36.0)
MCV: 87.5 fL (ref 80.0–100.0)
Platelets: 191 10*3/uL (ref 150–400)
RBC: 3.92 MIL/uL (ref 3.87–5.11)
RDW: 17.2 % — ABNORMAL HIGH (ref 11.5–15.5)
WBC: 72.4 10*3/uL (ref 4.0–10.5)
nRBC: 0 % (ref 0.0–0.2)

## 2019-09-21 LAB — BASIC METABOLIC PANEL
Anion gap: 9 (ref 5–15)
BUN: 18 mg/dL (ref 8–23)
CO2: 24 mmol/L (ref 22–32)
Calcium: 8.6 mg/dL — ABNORMAL LOW (ref 8.9–10.3)
Chloride: 106 mmol/L (ref 98–111)
Creatinine, Ser: 1.02 mg/dL — ABNORMAL HIGH (ref 0.44–1.00)
GFR calc Af Amer: >60 mL/min (ref >60)
GFR calc non Af Amer: 55 mL/min — ABNORMAL LOW (ref >60)
Glucose, Bld: 102 mg/dL — ABNORMAL HIGH (ref 70–99)
Potassium: 4.3 mmol/L (ref 3.5–5.1)
Sodium: 139 mmol/L (ref 135–145)

## 2019-09-21 LAB — GLUCOSE, CAPILLARY
Glucose-Capillary: 86 mg/dL (ref 70–99)
Glucose-Capillary: 117 mg/dL — ABNORMAL HIGH (ref 70–99)
Glucose-Capillary: 129 mg/dL — ABNORMAL HIGH (ref 70–99)
Glucose-Capillary: 126 mg/dL — ABNORMAL HIGH (ref 70–99)

## 2019-09-21 NOTE — Plan of Care (Signed)
  Problem: Consults Goal: RH GENERAL PATIENT EDUCATION Description: See Patient Education module for education specifics. Outcome: Progressing Goal: Nutrition Consult-if indicated Outcome: Progressing   Problem: RH BOWEL ELIMINATION Goal: RH STG MANAGE BOWEL WITH ASSISTANCE Description: STG Manage Bowel with mod Assistance.  Outcome: Progressing   Problem: RH BLADDER ELIMINATION Goal: RH STG MANAGE BLADDER WITH ASSISTANCE Description: STG Manage Bladder With mod Assistance Outcome: Progressing   Problem: RH SKIN INTEGRITY Goal: RH STG SKIN FREE OF INFECTION/BREAKDOWN Description: Patients skin will remain free from further infection or breakdown with min assist. Outcome: Progressing   Problem: RH SAFETY Goal: RH STG ADHERE TO SAFETY PRECAUTIONS W/ASSISTANCE/DEVICE Description: STG Adhere to Safety Precautions With min Assistance/Device. Outcome: Progressing   Problem: RH PAIN MANAGEMENT Goal: RH STG PAIN MANAGED AT OR BELOW PT'S PAIN GOAL Description: < 3 Outcome: Progressing

## 2019-09-21 NOTE — Progress Notes (Signed)
Physical Therapy Session Note  Patient Details  Name: Renee [name] MRN: [id number] Date of Birth: Jul 14, 1946  Today's Date: 09/21/2019 PT Individual Time: 1450-1535 PT Individual Time Calculation (min): 45 min   Short Term Goals: Week 2:  PT Short Term Goal 1 (Week 2): Pt will perform supine<>sit with CGA PT Short Term Goal 2 (Week 2): Pt will perform bed<>chair transfer with min assist PT Short Term Goal 3 (Week 2): Pt will ambulate at least 66ft using LRAD with mod assist of 1 and +2 w/c follow PT Short Term Goal 4 (Week 2): Pt will perform at least 21ft of w/c propulsion with min assist  Skilled Therapeutic Interventions/Progress Updates:    Pt supine in bed upon PT arrival, agreeable to therapy tx and reports buttocks pain, does not rate. Pt transferred to sitting EOB with min assist, therapist donned shoes total assist. Squat pivot to w/c with mod assist, pt transported to the gym. Therapist performed R LE calf stretching this session, pt lacking ROM to neutral DF. Therapist donned PLS AFO and heel wedge in patients R shoe in attempt to compensate for limited ankle range and help limit knee hypreextension. Pt performed sit<>stands x 2 and pre-gait in parallel bars, continues with significant R LE hyperextension despite therapists attempt to block. Therapist and patient discussed potential need for custom orthotic to help control knee joint. Therapist donned knee cage to patients R knee. Pt performed x 3 it<>stands mod assist, worked on pre-gait for R stance control while stepping with L LE in place and then ambulated x 5 ft within parallel bars with +2 for w/c follow. Pt transported back to room and left in w/c with needs in reach and chair alarm set, goal to stay up in chair for 30 min. Communicated this goal to nursing staff.   Therapy Documentation Precautions:  Precautions Precautions: Fall Precaution Comments: Significant weakness at RLE, impaired R UE  proprioception Restrictions Weight Bearing Restrictions: No    Therapy/Group: Individual Therapy  Netta Corrigan, PT, DPT, CSRS 09/21/2019, 7:57 AM

## 2019-09-21 NOTE — Progress Notes (Signed)
Occupational Therapy Session Note  Patient Details  Name: Renee [name] MRN: [id number] Date of Birth: February 15, 1946  Today's Date: 09/21/2019 OT Individual Time: [id number] OT Individual Time Calculation (min): 72 min    Short Term Goals: Week 2:  OT Short Term Goal 1 (Week 2): Pt will complete squat pivot transfers to toilet with min A. OT Short Term Goal 2 (Week 2): Pt will be able to self cleanse post toileting with CGA to support balance. OT Short Term Goal 3 (Week 2): Pt will demonstrate improved dynamic sit balance to don pants over feet with min A. OT Short Term Goal 4 (Week 2): Pt will demonstrate lateral leans with min A to pull pants up/down hips with mod A.  Skilled Therapeutic Interventions/Progress Updates:    Upon entering the room, pt supine in bed with no c/o pain and agreeable to OT intervention. Pt reports, " I need to work on my hand some." Supine >sit with mod A to EOB with min cuing for hand placement. Pt's sitting balance with close supervision. OT placing shoe in her lap and she was able to tie laces with B hands with min A to place R hand on bow and pull secondary to her letting go. Mod A squat pivot transfer bed > wheelchair and OT assisted pt to gym for time management. Pt utilized UE ergonmeter for several minutes with mod cuing to attend to R hand gripping handle. Pt able to move several cycles with R UE only with cuing for encouragement. Pt also engaged in dynavision task while seated and reaching out of base of support for 5 minutes to hit targets. AROM/AAROM to hit targets on board. Pt very fatigued after 5 minutes and needing rest break. Pt returning to room and engaged in several B coordination tasks with UE such as folding towel in lap and other clothing items. Pt needing increased time and mod cuing for technique but was able to complete tasks.Pt reports rectum beginning to hurt and requests returning to bed to rest before next therapy session. Mod A squat pivot  transfer and mod A sit >supine. Bed alarm activated and call bell within reach upon exiting the room.   Therapy Documentation Precautions:  Precautions Precautions: Fall Precaution Comments: Significant weakness at RLE, impaired R UE proprioception Restrictions Weight Bearing Restrictions: No    ADL: ADL Eating: Set up Where Assessed-Eating: Bed level Grooming: Setup Where Assessed-Grooming: Sitting at sink Upper Body Bathing: Contact guard Where Assessed-Upper Body Bathing: Edge of bed Lower Body Bathing: Moderate assistance Where Assessed-Lower Body Bathing: Edge of bed, Bed level Upper Body Dressing: Minimal assistance Where Assessed-Upper Body Dressing: Edge of bed Lower Body Dressing: Maximal assistance Where Assessed-Lower Body Dressing: Edge of bed, Bed level Toileting: Maximal assistance Where Assessed-Toileting: Glass blower/designer: Moderate assistance Toilet Transfer Method: Squat pivot Toilet Transfer Equipment: Grab bars, Raised toilet seat   Therapy/Group: Individual Therapy  Gypsy Decant 09/21/2019, 4:14 PM

## 2019-09-21 NOTE — Progress Notes (Signed)
Orthopedic Tech Progress Note Patient Details:  Marjie Marchioni Jul 28, 1946 [id number] Called in order to [facility] for an AFO consult Patient ID: Malori Schatzle, female   DOB: [birth year], 74 y.o.   MRN: [id number]   Janit Pagan 09/21/2019, 3:41 PM

## 2019-09-21 NOTE — Progress Notes (Signed)
Nutrition Follow-up  RD working remotely.  DOCUMENTATION CODES:   Non-severe (moderate) malnutrition in context of chronic illness  INTERVENTION:  - Please obtain updated weight  - ContinueEnsure Enlive poTID, each supplement provides 350 kcal and 20 grams of protein  -Continue Magic cupBID withlunch and dinnermeals, each supplement provides 290 kcal and 9 grams of protein  - Encourage adequate PO intake  NUTRITION DIAGNOSIS:   Moderate Malnutrition related to chronic illness as evidenced by mild fat depletion, mild muscle depletion, moderate muscle depletion.  Ongoing, being addressed via supplements  GOAL:   Patient will meet greater than or equal to 90% of their needs  Progressing  MONITOR:   PO intake, Supplement acceptance, Labs, Weight trends  REASON FOR ASSESSMENT:   Malnutrition Screening Tool    ASSESSMENT:   75 year old female with PMH of CKD, HTN, CLL, breast cancer s/p lumpectomy with XRT, pudendal neuralgia, anxiety disorder. Pt was admitted on 09/04/19 with history of multiple falls with progressive weakness. MRI brain done showing 2 cm cystic lesion with vasogenic edema and question of tumor versus cerebritis. Pt was started on Decadron and MRI brain with contrast showed 2 cm subcortical white matter lesion in parietal lobe without enhancement. Pt admitted to CIR on 1/05.  Noted target d/c date of 09/30/19.  Pt accepting 100% of Ensure Enlive supplements over the last week per [facility] documentation.  No new weights since 09/14/19.  Meal Completion: 50-100% x last 8 meals (averaging 94%)  Medications reviewed and include: decadron, Ensure Enlive TID, SSI, lactulose, Linzess, protonix, senna  Labs reviewed. CBG's: 86-150 x 24 hours  Diet Order:   Diet Order            Diet regular Room service appropriate? Yes; Fluid consistency: Thin  Diet effective now              EDUCATION NEEDS:   Education needs have been addressed  Skin:  Skin  Assessment: Reviewed RN Assessment  Last BM:  09/21/19 type 4  Height:   Ht Readings from Last 1 Encounters:  09/03/19 6\' 1"  (1.854 m)    Weight:   Wt Readings from Last 1 Encounters:  09/14/19 78.9 kg    Ideal Body Weight:  75 kg  BMI:  Body mass index is 22.95 kg/m.  Estimated Nutritional Needs:   Kcal:  1900-2100  Protein:  95-110 grams  Fluid:  >/= 1.9 L    Gaynell Face, MS, RD, LDN Inpatient Clinical Dietitian Pager: [phone] Weekend/After Hours: [phone]

## 2019-09-21 NOTE — Progress Notes (Signed)
  Patient ID: Renee [name], female   DOB: [birth year], 74 y.o.   MRN: [id number]    Diagnosis codes: C71.3; G81.11 and R53.81  Height:   6'1"           Weight:   173 lbs      Patient suffers from brain neoplasm with hemiplegia and debility which impairs their ability to perform daily activities like bathing, dressing, toileting and mobility in the home.  A walker will not resolve issue with performing activities of daily living.  A wheelchair will allow patient to safely perform daily activities.  Patient is not able to propel themselves in the home using a standard weight wheelchair due to hemiplegia and weakness.  Patient can self propel in the lightweight wheelchair.   [name], PA-C

## 2019-09-21 NOTE — Progress Notes (Addendum)
[facility] PHYSICAL MEDICINE & REHABILITATION PROGRESS NOTE  Subjective/Complaints: Patient seen laying in bed this AM.  She states she slept well overnight.   Has some left hip pain with movement. Has been taught turning strategies by therapy to minimize excessive muscle activation while turning, but she states she forgets to mention these to nursing staff and ends up using compensatory strategies.   ROS: Denies CP, SOB, N/V/D  Objective: Vital Signs: Blood pressure 112/64, pulse 61, temperature 98.1 F (36.7 C), temperature source Oral, resp. rate 18, weight 78.9 kg, SpO2 100 %. No results found. Recent Labs    09/21/19 0553  WBC 72.4*  HGB 10.3*  HCT 34.3*  PLT 191   Recent Labs    09/21/19 0553  NA 139  K 4.3  CL 106  CO2 24  GLUCOSE 102*  BUN 18  CREATININE 1.02*  CALCIUM 8.6*    Physical Exam: BP 112/64 (BP Location: Left Arm)   Pulse 61   Temp 98.1 F (36.7 C) (Oral)   Resp 18   Wt 78.9 kg   SpO2 100%   BMI 22.95 kg/m  Constitutional: No distress . Vital signs reviewed. HENT: Normocephalic.  Atraumatic. Eyes: EOMI. No discharge. Cardiovascular: No JVD. Respiratory: Normal effort.  No stridor. GI: Non-distended. Skin: Warm and dry.  Intact. Psych: Normal mood.  Normal behavior. Musc: No edema in extremities.  No tenderness in extremities. Neuro: Alert Motor:  RUE: 4+/5 proximal to distal, unchanged Subjective decrease sensation on fourth and fifth digit RLE: HF, KE 3 -/5, ADF 2/5, stable Increased tone right ankle, ?improving  Assessment/Plan: 1. 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  Physiatrist is providing close team supervision and 24 hour management of active medical problems listed below.  Physiatrist and rehab team continue to assess barriers to discharge/monitor patient progress toward functional and medical  goals  Care Tool:  Bathing  Bathing activity did not occur: Refused Body parts bathed by patient: Right arm, Left arm, Chest, Abdomen, Right upper leg, Left upper leg   Body parts bathed by helper: Right lower leg, Left lower leg Body parts n/a: Front perineal area, Buttocks   Bathing assist Assist Level: Moderate Assistance - Patient 50 - 74%     Upper Body Dressing/Undressing Upper body dressing   What is the patient wearing?: Pull over shirt    Upper body assist Assist Level: Minimal Assistance - Patient > 75%    Lower Body Dressing/Undressing Lower body dressing      What is the patient wearing?: Incontinence brief, Pants     Lower body assist Assist for lower body dressing: Moderate Assistance - Patient 50 - 74%     Toileting Toileting    Toileting assist Assist for toileting: Moderate Assistance - Patient 50 - 74%     Transfers Chair/bed transfer  Transfers assist     Chair/bed transfer assist level: Minimal Assistance - Patient > 75%(squat pivot to L)     Locomotion Ambulation   Ambulation assist      Assist level: Total Assistance - Patient < 25% Assistive device: Lite Gait Max distance: 63ft   Walk 10 feet activity   Assist     Assist level: Total Assistance - Patient < 25% Assistive device: Lite Gait   Walk 50 feet activity   Assist Walk 50 feet with 2 turns activity did not occur: Safety/medical concerns  Assist level: Total Assistance -  Patient < 25% Assistive device: Lite Gait    Walk 150 feet activity   Assist Walk 150 feet activity did not occur: Safety/medical concerns         Walk 10 feet on uneven surface  activity   Assist Walk 10 feet on uneven surfaces activity did not occur: Safety/medical concerns         Wheelchair     Assist   Type of Wheelchair: Manual    Wheelchair assist level: Moderate Assistance - Patient 50 - 74% Max wheelchair distance: 50    Wheelchair 50 feet with 2 turns  activity    Assist        Assist Level: Moderate Assistance - Patient 50 - 74%   Wheelchair 150 feet activity     Assist Wheelchair 150 feet activity did not occur: Safety/medical concerns          Medical Problem List and Plan:  1. 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 2. Antithrombotics:   -DVT/anticoagulation: Pharmaceutical: Lovenox   -antiplatelet therapy: n/a  3. Chronic pudendal pain/Pain Management: Cymbalta  Continue methadone bid, afternoon dose added, changed dosing to 10 mg 3 times daily  Monitor with increased mobility  ?Rash allergy to Lyrica-tolerating retrial, increased on 1/15  Baclofen 5 TID started on 1/8, increased on 1/13  Added anusol supp HCT BID  Cont Tucks pad to anal area QID  Continue ice  Relatively controlled with medications on 1/17, 1/18  Will consider further medication adjustments if necessary 4. Mood: LCSW to follow for evaluation and support.   -antipsychotic agents: N/A  5. Neuropsych: This patient is ? fully capable of making decisions on her own behalf.  6. Skin/Wound Care: Routine pressure relief measures.  7. 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 Improved.   Creatinine 0.98 on 1/15, 1.02 on 1/18  Encourage fluids  Cont to monitor 9. Hypothyroid: Resume supplement.  10. 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  Recommendations to reduce decadron to 4 mg at discharge, will follow up on frequency.    On Keppra and decadron-->tapered to bid on 1/5  12. Steroid induced hyperglycemia: Hgb A1c-5.4.   Labile on 1/17, stable on 1/18.   Monitor with increased mobility 13. OIC/?IBS: On multiple medications:  Laculose changed to PRN per patient preference  Senna decreased on 1/13  Linzess restarted per pt/family preference   Improving 14. Breast cancer in remission/CLL being observed:   WBCs 85.9 on 1/13, stable 1/18.   Discussed with Neuro/Onc and Heme/Onc.  Per Heme/Onc cont to monitor and no cause for concern unless acute spike.  Afebrile  Cont to monitor 14. Iron deficiently anemia: Resolved  Added iron supplement   Hb 10.4 on 10/13, 10.3 on 1.18  Cont to monitor 15. Spasticity of R ankle  See #3  PRAFO  ?  Improving 16.  Neurogenic bladder  Continue ditropan qhs  17.  Right scapular pain  X-ray reviewed, unremarkable for fracture  Voltaren gel ordered on 1/13  Improving 18.  Hypotension  Asymptomatic at present  Labile/soft on 1/17, 1/18.     LOS: 12 days A FACE TO FACE EVALUATION WAS PERFORMED  Magin Balbi P Kairos Panetta 09/21/2019, 10:05 AM

## 2019-09-21 NOTE — Progress Notes (Signed)
Occupational Therapy Session Note  Patient Details  Name: Renee [name] MRN: [id number] Date of Birth: 08/26/1946  Today's Date: 09/21/2019 OT Individual Time: 9381-8299 OT Individual Time Calculation (min): 75 min    Short Term Goals: Week 1:  OT Short Term Goal 1 (Week 1): Pt will don shirt with (S) OT Short Term Goal 1 - Progress (Week 1): Met OT Short Term Goal 2 (Week 1): Pt will demonstrate improved RUE proprioception by having 75% reaching accuracy OT Short Term Goal 2 - Progress (Week 1): Progressing toward goal OT Short Term Goal 3 (Week 1): Pt will don pants with min A OT Short Term Goal 3 - Progress (Week 1): Progressing toward goal OT Short Term Goal 4 (Week 1): Pt will transfer to [facility] with mod A OT Short Term Goal 4 - Progress (Week 1): Met Week 2:  OT Short Term Goal 1 (Week 2): Pt will complete squat pivot transfers to toilet with min A. OT Short Term Goal 2 (Week 2): Pt will be able to self cleanse post toileting with CGA to support balance. OT Short Term Goal 3 (Week 2): Pt will demonstrate improved dynamic sit balance to don pants over feet with min A. OT Short Term Goal 4 (Week 2): Pt will demonstrate lateral leans with min A to pull pants up/down hips with mod A.  Skilled Therapeutic Interventions/Progress Updates:    Pt received in bed. Pt stated she was bathed and dressed yesterday and did not feel the need to do it again today.  Spent much of the session in supine in bed focused on NMR of R leg and arm with bridging, pelvic tilts, knee drops using abdominals, B hands clasped with PNF d1d2 exercises, and B hands on gym ball lifting and lowering over chest level.   Pt worked on rolling with having R knee bent and cues to bring R leg over her body and R arm across chest to enable her to roll without pulling up as much on bar.  Sitting balance with cues for midline correction as she puts more weight on R hip.  Sitting A/arom of R shoulder. Pt having difficulty reaching  arm overhead but can do so with support. Closed chain RUE coordination of sliding arm back and forth.  Discussed some of her memory issues and that a speech therapy eval may be helpful .with memory strategies.   Pt does not have therapy again for several hours.  Asked her if she wanted to stay in bed or get in w/c.  She opted for w.c Brushed teeth at sink and then got pt all set up with tray, alarm belt, etc.    Just as I was leaving the room, she said "you are not going to make me stay in the chair are you?"  Reminded pt that we just discussed this and she was  Upset no that she did not agree to this.  The nurse tech offered to get her in bed but then she said she would sit up for awhile.    Therapy Documentation Precautions:  Precautions Precautions: Fall Precaution Comments: Significant weakness at RLE, impaired R UE proprioception Restrictions Weight Bearing Restrictions: No       Pain:   Pt c/o back pain - MD aware    ADL: ADL Eating: Set up Where Assessed-Eating: Bed level Grooming: Setup Where Assessed-Grooming: Sitting at sink Upper Body Bathing: Contact guard Where Assessed-Upper Body Bathing: Edge of bed Lower Body Bathing: Moderate assistance Where Assessed-Lower  Body Bathing: Edge of bed, Bed level Upper Body Dressing: Minimal assistance Where Assessed-Upper Body Dressing: Edge of bed Lower Body Dressing: Maximal assistance Where Assessed-Lower Body Dressing: Edge of bed, Bed level Toileting: Maximal assistance Where Assessed-Toileting: Glass blower/designer: Moderate assistance Toilet Transfer Method: Squat pivot Toilet Transfer Equipment: Grab bars, Raised toilet seat   Therapy/Group: Individual Therapy  Qaadir Kent 09/21/2019, 12:04 PM

## 2019-09-22 ENCOUNTER — Inpatient Hospital Stay (HOSPITAL_COMMUNITY): Payer: Medicare Other | Admitting: Occupational Therapy

## 2019-09-22 ENCOUNTER — Inpatient Hospital Stay (HOSPITAL_COMMUNITY): Payer: Medicare Other | Admitting: Physical Therapy

## 2019-09-22 ENCOUNTER — Inpatient Hospital Stay (HOSPITAL_COMMUNITY): Payer: Medicare Other | Admitting: Speech Pathology

## 2019-09-22 LAB — GLUCOSE, CAPILLARY
Glucose-Capillary: 88 mg/dL (ref 70–99)
Glucose-Capillary: 124 mg/dL — ABNORMAL HIGH (ref 70–99)
Glucose-Capillary: 129 mg/dL — ABNORMAL HIGH (ref 70–99)

## 2019-09-22 NOTE — Progress Notes (Signed)
[place] PHYSICAL MEDICINE & REHABILITATION PROGRESS NOTE  Subjective/Complaints: Patient seen sitting up in bed this morning.  She states she slept fairly overnight due to urinary frequency.  She notes that bowel movements and pain are controlled.  ROS: Denies CP, SOB, N/V/D  Objective: Vital Signs: Blood pressure (!) 103/50, pulse (!) 53, temperature 98 F (36.7 C), resp. rate 18, weight 78.9 kg, SpO2 96 %. No results found. Recent Labs    09/21/19 0553  WBC 72.4*  HGB 10.3*  HCT 34.3*  PLT 191   Recent Labs    09/21/19 0553  NA 139  K 4.3  CL 106  CO2 24  GLUCOSE 102*  BUN 18  CREATININE 1.02*  CALCIUM 8.6*    Physical Exam: BP (!) 103/50 (BP Location: Left Arm)   Pulse (!) 53   Temp 98 F (36.7 C)   Resp 18   Wt 78.9 kg   SpO2 96%   BMI 22.95 kg/m  Constitutional: No distress . Vital signs reviewed. HENT: Normocephalic.  Atraumatic. Eyes: EOMI. No discharge. Cardiovascular: No JVD. Respiratory: Normal effort.  No stridor. GI: Non-distended. Skin: Warm and dry.  Intact. Psych: Normal mood.  Normal behavior. Musc: No edema in extremities.  No tenderness in extremities. Neuro: Alert Motor:  RUE: 4+/5 proximal to distal, stable Sensation intact light touch  RLE: HF, KE 3 -/5, ADF 2/5 (limited by boot this a.m.)  Assessment/Plan: 1. 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  Physiatrist is providing close team supervision and 24 hour management of active medical problems listed below.  Physiatrist and rehab team continue to assess barriers to discharge/monitor patient progress toward functional and medical goals  Care Tool:  Bathing  Bathing activity did not occur: Refused Body parts bathed by patient: Right arm, Left arm, Chest, Abdomen, Right upper leg, Left upper leg   Body parts bathed by helper: Right lower leg, Left  lower leg Body parts n/a: Front perineal area, Buttocks   Bathing assist Assist Level: Moderate Assistance - Patient 50 - 74%     Upper Body Dressing/Undressing Upper body dressing   What is the patient wearing?: Pull over shirt    Upper body assist Assist Level: Minimal Assistance - Patient > 75%    Lower Body Dressing/Undressing Lower body dressing      What is the patient wearing?: Incontinence brief, Pants     Lower body assist Assist for lower body dressing: Moderate Assistance - Patient 50 - 74%     Toileting Toileting    Toileting assist Assist for toileting: Moderate Assistance - Patient 50 - 74%     Transfers Chair/bed transfer  Transfers assist     Chair/bed transfer assist level: Moderate Assistance - Patient 50 - 74%     Locomotion Ambulation   Ambulation assist      Assist level: 2 helpers Assistive device: Parallel bars Max distance: 3 ft   Walk 10 feet activity   Assist     Assist level: Total Assistance - Patient < 25% Assistive device: Lite Gait   Walk 50 feet activity   Assist Walk 50 feet with 2 turns activity did not occur: Safety/medical concerns  Assist level: Total Assistance - Patient < 25% Assistive device: Lite Gait    Walk 150 feet activity   Assist Walk 150 feet activity did not occur: Safety/medical concerns  Walk 10 feet on uneven surface  activity   Assist Walk 10 feet on uneven surfaces activity did not occur: Safety/medical concerns         Wheelchair     Assist   Type of Wheelchair: Manual    Wheelchair assist level: Moderate Assistance - Patient 50 - 74% Max wheelchair distance: 50    Wheelchair 50 feet with 2 turns activity    Assist        Assist Level: Moderate Assistance - Patient 50 - 74%   Wheelchair 150 feet activity     Assist Wheelchair 150 feet activity did not occur: Safety/medical concerns          Medical Problem List and Plan:  1.  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 Antithrombotics:   -DVT/anticoagulation: Pharmaceutical: Lovenox   -antiplatelet therapy: n/a  3. Chronic pudendal pain/Pain Management: Cymbalta  Continue methadone bid, afternoon dose added, changed dosing to 10 mg 3 times daily  Monitor with increased mobility  ?Rash allergy to Lyrica-tolerating retrial, increased on 1/15  Baclofen 5 TID started on 1/8, increased on 1/13  Added anusol supp HCT BID  Cont Tucks pad to anal area QID  Continue ice  Relatively controlled with medications on 1/19  Will consider further medication adjustments if necessary 4. Mood: LCSW to follow for evaluation and support.   -antipsychotic agents: N/A  5. Neuropsych: This patient is ? fully capable of making decisions on her own behalf.  6. Skin/Wound Care: Routine pressure relief measures.  7. 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 Improved.   Creatinine 1.02 on 1/18  Encourage fluids  Cont to monitor 9. Hypothyroid: Resume supplement.  10. 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  Recommendations to reduce decadron to 4 mg at discharge, will follow up on frequency.   On Keppra and decadron-->tapered to bid on 1/5  12. Steroid induced hyperglycemia: Hgb A1c-5.4.   Labile on 1/19  Monitor with increased mobility 13. OIC/?IBS: On multiple medications:  Laculose changed to PRN per patient preference  Senna decreased on 1/13  Linzess restarted per pt/family preference   Stable with meds on 1/19 14. Breast cancer in remission/CLL being observed:   WBCs 72.4 on 1/18.   Discussed with  Neuro/Onc and Heme/Onc.  Per Heme/Onc cont to monitor and no cause for concern unless acute spike.  Afebrile  Cont to monitor 14. Iron deficiently anemia: Resolved  Added iron supplement   Hb 10.3 on 1/18  Cont to monitor 15. Spasticity of R ankle  See #3  PRAFO  ?  Improving 16.  Neurogenic bladder  Continue ditropan qhs, will consider medication adjustments if persistent issue 17.  Right scapular pain  X-ray reviewed, unremarkable for fracture  Voltaren gel ordered on 1/13  Improving 18.  Hypotension  Asymptomatic at present  Relatively controlled on 1/19    LOS: 13 days A FACE TO FACE EVALUATION WAS PERFORMED  Renee [name] 09/22/2019, 8:52 AM

## 2019-09-22 NOTE — Progress Notes (Signed)
Occupational Therapy Session Note  Patient Details  Name: Renee [name] MRN: [id number] Date of Birth: 1946-03-24  Today's Date: 09/22/2019 OT Individual Time: 1000-1100 OT Individual Time Calculation (min): 60 min  and Today's Date: 09/22/2019 OT Missed Time: 15 Minutes Missed Time Reason: Unavailable (comment)(pt in bed and workers (nonclinical) in room for first 15 minutes)   Short Term Goals: Week 1:  OT Short Term Goal 1 (Week 1): Pt will don shirt with (S) OT Short Term Goal 1 - Progress (Week 1): Met OT Short Term Goal 2 (Week 1): Pt will demonstrate improved RUE proprioception by having 75% reaching accuracy OT Short Term Goal 2 - Progress (Week 1): Progressing toward goal OT Short Term Goal 3 (Week 1): Pt will don pants with min A OT Short Term Goal 3 - Progress (Week 1): Progressing toward goal OT Short Term Goal 4 (Week 1): Pt will transfer to [facility] with mod A OT Short Term Goal 4 - Progress (Week 1): Met Week 2:  OT Short Term Goal 1 (Week 2): Pt will complete squat pivot transfers to toilet with min A. OT Short Term Goal 2 (Week 2): Pt will be able to self cleanse post toileting with CGA to support balance. OT Short Term Goal 3 (Week 2): Pt will demonstrate improved dynamic sit balance to don pants over feet with min A. OT Short Term Goal 4 (Week 2): Pt will demonstrate lateral leans with min A to pull pants up/down hips with mod A.  Skilled Therapeutic Interventions/Progress Updates:    upon entering room pt in bed needing to get out of bed to get bathed and dressed but a hospital worker was in the room and needed 15 min to complete his job.  After he left, pt moved in bed with min A and MOD cues to roll and position R arm and leg.  Pt sat up to EOB with mod A and needed constant cues to maintain midline as she would quickly fall back or to the L. Worked on UB self care with min A bathing as she was having great difficulty using R hand to wash L arm. Pt has decreased strength in  arm from a few days ago. To remove old pants worked on lateral leans with min-mod A and mod A to doff pants.  Pt c/o back pain and spasm in L QL so she did not reach forward to wash lower legs.  Donned clothing socks and shoes for pt and then she stood up with stedy with min A.  Seated on pads of stedy she washed front perineal area and then stood with mod A (leaning to side) for the therapist to wash bottom. Full A with pants as pt held bars of stedy.   Transferred to w/c and taken to gym to work on arm bike. Last week she could use R hand to push pedals but today she needed A to move arm and cannot push pedal with R hand only today which she could last week.    Returned to room and completed squat pivot to her L with mod A to bed and min A to get adjusted in bed. Pt resting in bed with all needs met. Bed alarm set.   Therapy Documentation Precautions:  Precautions Precautions: Fall Precaution Comments: Significant weakness at RLE, impaired R UE proprioception Restrictions Weight Bearing Restrictions: No General: General OT Amount of Missed Time: 15 Minutes   Pain: Pain Assessment Pain Scale: Faces Faces Pain Scale: Hurts even  more Pain Type: Chronic pain Pain Location: Rectum Pain Orientation: Mid Pain Descriptors / Indicators: Discomfort;Aching Pain Onset: On-going Patients Stated Pain Goal: 3 Pain Intervention(s): Medication (See eMAR)(methadone) ADL: ADL Eating: Set up Where Assessed-Eating: Bed level Grooming: Setup Where Assessed-Grooming: Sitting at sink Upper Body Bathing: Contact guard Where Assessed-Upper Body Bathing: Edge of bed Lower Body Bathing: Moderate assistance Where Assessed-Lower Body Bathing: Edge of bed, Bed level Upper Body Dressing: Minimal assistance Where Assessed-Upper Body Dressing: Edge of bed Lower Body Dressing: Maximal assistance Where Assessed-Lower Body Dressing: Edge of bed, Bed level Toileting: Maximal assistance Where  Assessed-Toileting: Glass blower/designer: Moderate assistance Toilet Transfer Method: Squat pivot Toilet Transfer Equipment: Grab bars, Raised toilet seat   Therapy/Group: Individual Therapy  Oryan Winterton 09/22/2019, 11:49 AM

## 2019-09-22 NOTE — Evaluation (Signed)
Speech Language Pathology Assessment and Plan  Patient Details  Name: Renee [name] MRN: [id number] Date of Birth: 1945/12/04  SLP Diagnosis: Cognitive Impairments  Rehab Potential: Good ELOS: 09/30/19    Today's Date: 09/22/2019 SLP Individual Time: 0730-0800 SLP Individual Time Calculation (min): 30 min   Problem List:  Patient Active Problem List   Diagnosis Date Noted  . Labile blood glucose   . Entrapment of right ulnar nerve   . Hypotension   . Neuropathic pain   . Pain of right scapula   . Cognitive deficits   . Neurogenic bladder   . CKD (chronic kidney disease), stage II   . Malnutrition of moderate degree 09/10/2019  . Leukocytosis   . Elevated BUN   . Mass of parietal lobe 09/09/2019  . Spastic hemiparesis ([place])   . Iron deficiency anemia   . Drug induced constipation   . Steroid-induced hyperglycemia   . Prolonged Q-T interval on ECG   . Low grade glioma of brain ([place])   . 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 Right sided weakness 09/03/2019  . Constipation 07/08/2019  . GERD (gastroesophageal reflux disease) 07/08/2019  . History of breast cancer 12/27/2017  . Chronic renal disease, stage 3, moderately decreased glomerular filtration rate between 30-59 mL/min/1.73 square meter 12/27/2017  . CLL (chronic lymphocytic leukemia) ([place]) 02/21/2017  . Chronic depression 05/14/2015  . Rectal pain, chronic 02/10/2015  . Chronic pain 12/28/2013  . 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 Hypothyroidism 01/07/2013  . Vitamin D deficiency 01/07/2013   Past Medical History:  Past Medical History:  Diagnosis Date  . Chronic renal disease, stage 3, moderately decreased glomerular filtration rate between 30-59 mL/min/1.73 square meter 12/27/2017  . Constipation   . Depression   . History of breast cancer 12/27/2017  . Hypertension   . Leukemia ([place])    CLL  . PVD (peripheral vascular disease) ([place])   . Thyroid disease    Past Surgical  History:  Past Surgical History:  Procedure Laterality Date  . ABDOMINAL HYSTERECTOMY    . BREAST SURGERY     Left Breast Lumpectomy  . COLON SURGERY    . HERNIA REPAIR  2000   Hiatal hernia repair      Assessment / Plan / Recommendation Clinical Impression Patient demonstrates mild cognitive impairments impacting short-term recall, functional problem solving and anticipatory awareness.  Mild word-finding deficits were also noted at the conversation level but does not appear to impact patient's overall functional communication at this time. Patient would benefit from skilled SLP intervention to maximize her cognitive functioning and overall functional independence prior to discharge.    Skilled Therapeutic Interventions          Administered a cognitive-linguistic evaluation, please see above for details.   SLP Assessment  Patient will need skilled [place] [facility] during CIR admission    Recommendations  Oral Care Recommendations: Oral care BID Recommendations for Other Services: Neuropsych consult Patient destination: Home Follow up Recommendations: 24 hour supervision/assistance;Home Health SLP Equipment Recommended: None recommended by SLP    SLP Frequency 3 to 5 out of 7 days   SLP Duration  SLP Intensity  SLP Treatment/Interventions 09/30/19  Minumum of 1-2 x/day, 30 to 90 minutes  Cognitive remediation/compensation;Internal/external aids;Cueing hierarchy;Environmental controls;Therapeutic Activities;Functional tasks;Patient/family education    Pain No/Denies Pain  SLP Evaluation Cognition Overall Cognitive Status: Impaired/Different from baseline Arousal/Alertness: Awake/alert Orientation Level: Oriented X4 Selective Attention: Appears intact Memory: Impaired Memory Impairment: Decreased short  term memory Decreased Short Term Memory: Functional basic Awareness: Impaired Awareness Impairment: Anticipatory impairment Problem Solving:  Impaired Problem Solving Impairment: Functional complex Safety/Judgment: Impaired  Comprehension Auditory Comprehension Overall Auditory Comprehension: Appears within functional limits for tasks assessed Visual Recognition/Discrimination Discrimination: Not tested Reading Comprehension Reading Status: Not tested Expression Expression Primary Mode of Expression: Verbal Verbal Expression Overall Verbal Expression: Appears within functional limits for tasks assessed Written Expression Dominant Hand: Right Written Expression: Not tested Oral Motor Oral Motor/Sensory Function Overall Oral Motor/Sensory Function: Within functional limits Motor Speech Overall Motor Speech: Appears within functional limits for tasks assessed   Short Term Goals: Week 1: SLP Short Term Goal 1 (Week 1): STGs=LTGs due to ELOS  Refer to Care Plan for Long Term Goals  Recommendations for other services: Neuropsych  Discharge Criteria: Patient will be discharged from SLP if patient refuses treatment 3 consecutive times without medical reason, if treatment goals not met, if there is a change in medical status, if patient makes no progress towards goals or if patient is discharged from hospital.  The above assessment, treatment plan, treatment alternatives and goals were discussed and mutually agreed upon: by patient  Renee [name] 09/22/2019, 8:11 AM

## 2019-09-22 NOTE — Progress Notes (Signed)
Physical Therapy Session Note  Patient Details  Name: Renee [name] MRN: [id number] Date of Birth: 04/03/1946  Today's Date: 09/22/2019 PT Individual Time: [id number] and 1306-1400  PT Individual Time Calculation (min): 58 min and 54 min  Short Term Goals: Week 2:  PT Short Term Goal 1 (Week 2): Pt will perform supine<>sit with CGA PT Short Term Goal 2 (Week 2): Pt will perform bed<>chair transfer with min assist PT Short Term Goal 3 (Week 2): Pt will ambulate at least 17ft using LRAD with mod assist of 1 and +2 w/c follow PT Short Term Goal 4 (Week 2): Pt will perform at least 3ft of w/c propulsion with min assist  Skilled Therapeutic Interventions/Progress Updates:    Session 1: Pt received sitting on toilet with stedy in place and pt agreeable to therapy session. Sit>stand toilet>stedy with min assist for lifting - pt required total assist peri-care and LB clothing management while standing in stedy with min assist for balance due to increased postural sway and R knee hyperextension. Stedy transfer to EOB. RN present for medication administration. Supine<>sit in bed with min assist for R LE management and trunk upright - cuing for sequencing and use of L UE support on bedrail for trunk support. Sitting EOB with CGA for steadying and pt using L UE support. Pt having significant L lower back/buttock, muscle spasm pain - RN applied cream for pain management - returned to supine and performed lower trunk rotation stretch for pain management. Returned to sitting as described above. R squat pivot EOB>w/c with min/mod assist for lifting/pivoting hips and pt using L UE support on bedrail. Transported to/from gym in w/c. Block practice squat pivot transfers w/c<>EOM with min/mod assist for lifting/pivoting hips and min cuing for sequencing. Then introduced slide board to perform lateral scoot transfer as opposed to squat pivot to compare pt's level of independence; however, with just 2 trials pt continued to  require min assist for transfer so maybe slightly less assistance than compared to without the board but not significant at this time. Therapist donned R knee cage to prevent hyperextension. Sit<>stands in // bar with pt using L UE support on // bar and mod assist for lifting. Static standing in // bars with R UE support on bar and L UE support on therapist's shoulder focused on midline orientation with sustained R knee extension contraction as pt unable to maintain contraction for >5seconds prior to knee flexing - pt did demonstrate improved R knee positioning while wearing knee cage as opposed to without it. Transported back to room. L squat pivot w/c>EOB with pt using L UE support on bedrail with min/mod assist for pivoting hips. Sit>supine with min assist. Pt left supine in bed with needs in reach and bed alarm on. Overall pt demonstrates decline in R LE strength as she is now unable to lift R foot from the floor to initiate placing it on w/c leg rest where as before she was able to clear her foot from the floor.  Session 2: Pt received supine in bed and agreeable to therapy session. Meghan, CPO, present for orthotics consult. Patient reports that her daughter has ordered a stedy for use at home. Therapist discussed with pt and orthotist the patient's main concerns of maintaining ankle DF ROM and preventing knee hyperextension during sit<>stands. Supine>sit with min assist for R LE management and trunk upright - cuing for sequencing to increase pt independence. Sitting EOB with L UE support on bedrail therapist donned shoes max assist. Therapist  discussed pt's inability to get R heel flat on the ground during sitting due to plantarflexion contracture and the implications on transfers. Sit>stand EOB>stedy with min assist for lifting into standing and once in standing pt continues to demonstrate poor R knee control with it buckling or locking into hyperextension as well as increased postural sway requiring min  assist for balance. Based on pt's presentation the orthotist recommended a custom fit AFO to allow improved ankle DF and knee hyperextension control - discussed this with the patient and she was in agreement. Stedy transfer to w/c. Transported to/from gym in w/c. Therapist educated pt via hand-over-hand facilitating of w/c part management including leg rests and brakes - pt demonstrating impaired ability to include R UE in these tasks due to progressive decline in strength. Squat pivot w/c<>EOM with mod assist for lifting/pivoting hips and pt continuing to require min cuing for head/hips relationship and proper UE positioning - pt demonstrating lack of ability to use R UE to support her trunk during transfers due to progressive weakness. Transported back to room and pt agreeable to remain seated in w/c for 30 minutes - nursing staff made aware. Pt left with needs in reach and seat belt alarm on.   Therapy Documentation Precautions:  Precautions Precautions: Fall Precaution Comments: Significant weakness at RLE, impaired R UE proprioception Restrictions Weight Bearing Restrictions: No  Pain:   Session 1: Details above - having L lower back/buttock region muscle spasm - RN provided medication.   Session 2: No reports of pain during session.    Therapy/Group: Individual Therapy  Tawana Scale, PT, DPT 09/22/2019, 7:48 AM

## 2019-09-22 NOTE — Progress Notes (Addendum)
Physical Therapy Session Note  Patient Details  Name: Renee [name] MRN: [id number] Date of Birth: April 09, 1946  Today's Date: 09/22/2019 PT Individual Time: [id number] PT Individual Time Calculation (min): 38 min   Short Term Goals: Week 2:  PT Short Term Goal 1 (Week 2): Pt will perform supine<>sit with CGA PT Short Term Goal 2 (Week 2): Pt will perform bed<>chair transfer with min assist PT Short Term Goal 3 (Week 2): Pt will ambulate at least 84ft using LRAD with mod assist of 1 and +2 w/c follow PT Short Term Goal 4 (Week 2): Pt will perform at least 12ft of w/c propulsion with min assist  Skilled Therapeutic Interventions/Progress Updates:  Pt seen to make up missed time from this morning.  Pt received in bed & agreeable to tx. Therapist dons/doffs tennis shoes total assist for time management & pt transfers supine>sitting EOB with min assist for RLE & to upright trunk with pt using bed rails & HOB elevated. Pt transfers bed>w/c with min assist via squat pivot with cuing for overall sequencing. Transported pt to ortho gym for time management. Pt completes car transfer at sedan simulated height with min assist squat pivot to L, min assist to place RLE in/out of car, and mod assist for car>w/c on R with ongoing cuing for technique. Pt propels w/c ortho gym>west nurses station with L hemi technique & min assist for steering. Pt utilized cybex kinetron in sitting with task focusing on BLE strengthening. Pt returned to room & transfers bed>w/c with min assist via stand pivot. Pt has difficulty coordinating scooting to [facility] even with cuing. Pt transfers sit>supine with uncontrolled descent to lower trunk & min assist to elevate RLE onto bed. Pt repositions in bed with max cuing for technique. Pt left in bed with alarm set, 4 rails up per pt request, call bell & all needs in reach.  Pt c/o spasms in lower L back throughout session with rest breaks provided PRN & nurse made aware of pt asking for  lidocaine patch for area.   Therapy Documentation Precautions:  Precautions Precautions: Fall Precaution Comments: Significant weakness at RLE, impaired R UE proprioception Restrictions Weight Bearing Restrictions: No    Therapy/Group: Individual Therapy  Waunita Schooner 09/22/2019, 3:37 PM

## 2019-09-23 ENCOUNTER — Inpatient Hospital Stay (HOSPITAL_COMMUNITY): Payer: Medicare Other

## 2019-09-23 ENCOUNTER — Inpatient Hospital Stay (HOSPITAL_COMMUNITY): Payer: Medicare Other | Admitting: *Deleted

## 2019-09-23 ENCOUNTER — Inpatient Hospital Stay (HOSPITAL_COMMUNITY): Payer: Medicare Other | Admitting: Speech Pathology

## 2019-09-23 ENCOUNTER — Inpatient Hospital Stay (HOSPITAL_COMMUNITY): Payer: Medicare Other | Admitting: Physical Therapy

## 2019-09-23 ENCOUNTER — Inpatient Hospital Stay (HOSPITAL_COMMUNITY): Payer: Medicare Other | Admitting: Occupational Therapy

## 2019-09-23 DIAGNOSIS — F411 Generalized anxiety disorder: Secondary | ICD-10-CM

## 2019-09-23 LAB — GLUCOSE, CAPILLARY
Glucose-Capillary: 117 mg/dL — ABNORMAL HIGH (ref 70–99)
Glucose-Capillary: 91 mg/dL (ref 70–99)
Glucose-Capillary: 114 mg/dL — ABNORMAL HIGH (ref 70–99)
Glucose-Capillary: 146 mg/dL — ABNORMAL HIGH (ref 70–99)
Glucose-Capillary: 120 mg/dL — ABNORMAL HIGH (ref 70–99)

## 2019-09-23 MED ORDER — GADOBUTROL 1 MMOL/ML IV SOLN
7.0000 mL | Freq: Once | INTRAVENOUS | Status: AC | PRN
  Administered 2019-09-23: 14:00:00 7 mL via INTRAVENOUS

## 2019-09-23 NOTE — Progress Notes (Signed)
Renee [name] PHYSICAL MEDICINE & REHABILITATION PROGRESS NOTE  Subjective/Complaints: Patient seen sitting up in bed this morning.  She states she slept well overnight.  She states she does not feel well this morning and states that her whole left side is weak and she cannot use it to support her right side anymore.  ROS: Denies CP, SOB, N/V/D  Objective: Vital Signs: Blood pressure 103/65, pulse (!) 54, temperature 98.2 F (36.8 C), resp. rate 18, weight 78.9 kg, SpO2 97 %. No results found. Recent Labs    09/21/19 0553  WBC 72.4*  HGB 10.3*  HCT 34.3*  PLT 191   Recent Labs    09/21/19 0553  NA 139  K 4.3  CL 106  CO2 24  GLUCOSE 102*  BUN 18  CREATININE 1.02*  CALCIUM 8.6*    Physical Exam: BP 103/65 (BP Location: Left Arm)   Pulse (!) 54   Temp 98.2 F (36.8 C)   Resp 18   Wt 78.9 kg   SpO2 97%   BMI 22.95 kg/m  Constitutional: No distress . Vital signs reviewed. HENT: Normocephalic.  Atraumatic. Eyes: EOMI. No discharge. Cardiovascular: No JVD. Respiratory: Normal effort.  No stridor. GI: Non-distended. Skin: Warm and dry.  Intact. Psych: Normal mood.  Normal behavior. Musc: No edema in extremities.  No tenderness in extremities. Neuro: Alert Motor:  RUE: 4+/5 proximal to distal, unchanged Left upper extremity: 5/5 proximal distal, unchanged RLE: HF, KE 1/5, ADF 0/5,?  Effort Left lower extremity: Hip flexion, knee extension 3+/5, ankle dorsiflexion 5/5  Assessment/Plan: 1. 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  Physiatrist is providing close team supervision and 24 hour management of active medical problems listed below.  Physiatrist and rehab team continue to assess barriers to discharge/monitor patient progress toward functional and medical goals  Care Tool:  Bathing  Bathing activity did not occur: Refused Body parts  bathed by patient: Right arm, Chest, Abdomen, Right upper leg, Left upper leg, Front perineal area, Face   Body parts bathed by helper: Right lower leg, Left lower leg, Left arm, Buttocks Body parts n/a: Front perineal area, Buttocks   Bathing assist Assist Level: Moderate Assistance - Patient 50 - 74%     Upper Body Dressing/Undressing Upper body dressing   What is the patient wearing?: Pull over shirt    Upper body assist Assist Level: Minimal Assistance - Patient > 75%    Lower Body Dressing/Undressing Lower body dressing      What is the patient wearing?: Underwear/pull up, Pants     Lower body assist Assist for lower body dressing: Maximal Assistance - Patient 25 - 49%     Toileting Toileting    Toileting assist Assist for toileting: Moderate Assistance - Patient 50 - 74%     Transfers Chair/bed transfer  Transfers assist     Chair/bed transfer assist level: Minimal Assistance - Patient > 75%     Locomotion Ambulation   Ambulation assist      Assist level: 2 helpers Assistive device: Parallel bars Max distance: 3 ft   Walk 10 feet activity   Assist     Assist level: Total Assistance - Patient < 25% Assistive device: Lite Gait   Walk 50 feet activity   Assist Walk 50 feet with 2 turns activity did not occur: Safety/medical concerns  Assist level: Total Assistance - Patient < 25% Assistive device: Lite Gait  Walk 150 feet activity   Assist Walk 150 feet activity did not occur: Safety/medical concerns         Walk 10 feet on uneven surface  activity   Assist Walk 10 feet on uneven surfaces activity did not occur: Safety/medical concerns         Wheelchair     Assist   Type of Wheelchair: Manual    Wheelchair assist level: Minimal Assistance - Patient > 75% Max wheelchair distance: 150 ft    Wheelchair 50 feet with 2 turns activity    Assist        Assist Level: Minimal Assistance - Patient > 75%    Wheelchair 150 feet activity     Assist Wheelchair 150 feet activity did not occur: Safety/medical concerns   Assist Level: Minimal Assistance - Patient > 75%      Medical Problem List and Plan:  1. 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    Will discuss with team objective weakness versus anxiety component.  Will consider further imaging if necessary. 2. Antithrombotics:   -DVT/anticoagulation: Pharmaceutical: Lovenox   -antiplatelet therapy: n/a  3. Chronic pudendal pain/Pain Management: Cymbalta  Continue methadone bid, afternoon dose added, changed dosing to 10 mg 3 times daily  Monitor with increased mobility  ?Rash allergy to Lyrica-tolerating retrial, increased on 1/15  Baclofen 5 TID started on 1/8, increased on 1/13  Added anusol supp HCT BID  Cont Tucks pad to anal area QID  Continue ice  Relatively controlled with medications on 1/20  Will consider further medication adjustments if necessary 4. Mood: LCSW to follow for evaluation and support.   Klonopin 0.5 twice daily for anxiety  -antipsychotic agents: N/A  5. Neuropsych: This patient is ? fully capable of making decisions on her own behalf.  6. Skin/Wound Care: Routine pressure relief measures.  7. 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 Improved.   Creatinine 1.02 on 1/18  Encourage fluids  Cont to monitor 9. Hypothyroid: Resume supplement.  10. H/o depression: On Lutuda, cymbalta and Clonazepam  11. Low grade glioma: Follow up  MRI brain in one month.  Recommendations to reduce decadron to 4 mg at discharge, will follow up on frequency.   On Keppra and decadron-->tapered to bid on 1/5  12. Steroid induced hyperglycemia: Hgb A1c-5.4.   Labile on 1/20  Monitor with increased mobility 13. OIC/?IBS: On multiple medications:  Laculose changed to PRN per patient preference  Senna decreased on 1/13  Linzess restarted per pt/family preference   Stable with meds on 1/20 14. Breast cancer in remission/CLL being observed:   WBCs 72.4 on 1/18.   Discussed with Neuro/Onc and Heme/Onc.  Per Heme/Onc cont to monitor and no cause for concern unless acute spike.  Afebrile  Cont to monitor 14. Iron deficiently anemia: Resolved  Added iron supplement   Hb 10.3 on 1/18  Cont to monitor 15. Spasticity of R ankle  See #3  PRAFO  ?  Improving 16.  Neurogenic bladder  Continue ditropan qhs 17.  Right  scapular pain  X-ray reviewed, unremarkable for fracture  Voltaren gel ordered on 1/13  Improved 18.  Hypotension  Asymptomatic at present  Stable on 1/20    LOS: 14 days A FACE TO FACE EVALUATION WAS PERFORMED  Piotr Christopher Lorie Phenix 09/23/2019, 9:06 AM

## 2019-09-23 NOTE — Progress Notes (Signed)
Occupational Therapy Session Note  Patient Details  Name: Renee [name] MRN: [id number] Date of Birth: 1946/05/01  Today's Date: 09/23/2019 OT Individual Time: 4696-2952 and 1130 -1200 OT Individual Time Calculation (min): 75 min and 30 min    Short Term Goals: Week 2:  OT Short Term Goal 1 (Week 2): Pt will complete squat pivot transfers to toilet with min A. OT Short Term Goal 2 (Week 2): Pt will be able to self cleanse post toileting with CGA to support balance. OT Short Term Goal 3 (Week 2): Pt will demonstrate improved dynamic sit balance to don pants over feet with min A. OT Short Term Goal 4 (Week 2): Pt will demonstrate lateral leans with min A to pull pants up/down hips with mod A.  Skilled Therapeutic Interventions/Progress Updates:    Visit 1:   Pain:  C/o R shoulder blade pain, pt had lidocane patch on   Pt seen for BADL retraining of toileting, bathing, and dressing with a focus on postural control and adaptive strategies. Worked on bed mobility with mod cues to attend to R side. Pt believes her L side is weaker but clarified to pt that actually it is her R side that has lost strength so when she tries to pull over in bed with L arm she is trying to move her R side without engaging it.  With cues pt can roll with CGA.   Sat to EOB and used stedy to [facility] over toilet and then to padded commode to sit in shower.  Overall mod A with sit to stands and min A with balance on shower seat.  Mod A bathing and then returned to EOB to dress.  After donning shirt, pt got fatigued and wanted to continue from bed level. Worked on getting pants over hips with bridging and rolling.    Set up with oral care in bed.  Pt resting in bed with all needs met, bed alarm set.   Visit 2: No c/o pain.   Pt in bed and agreeable to RUE exercises.  Worked on a/arom for [employer].  Pt continues to look strength in R arm and has limited sh flexion and elbow extension AROM.  She also has more difficulty with fully  opening her hand.  Worked on grasp release and reaching with cones needing assistance to guide arm.    Pt set up with tray table to prepare for lunch.  Therapy Documentation Precautions:  Precautions Precautions: Fall Precaution Comments: Significant weakness at RLE, impaired R UE proprioception Restrictions Weight Bearing Restrictions: No      ADL: ADL Eating: Set up Where Assessed-Eating: Bed level Grooming: Setup Where Assessed-Grooming: Sitting at sink Upper Body Bathing: Contact guard Where Assessed-Upper Body Bathing: Edge of bed Lower Body Bathing: Moderate assistance Where Assessed-Lower Body Bathing: Edge of bed, Bed level Upper Body Dressing: Minimal assistance Where Assessed-Upper Body Dressing: Edge of bed Lower Body Dressing: Maximal assistance Where Assessed-Lower Body Dressing: Edge of bed, Bed level Toileting: Maximal assistance Where Assessed-Toileting: Glass blower/designer: Moderate assistance Toilet Transfer Method: Squat pivot Toilet Transfer Equipment: Grab bars, Raised toilet seat   Therapy/Group: Individual Therapy  [place] 09/23/2019, 8:30 AM

## 2019-09-23 NOTE — Progress Notes (Signed)
Physical Therapy Weekly Progress Note  Patient Details  Name: Kalimah Brannick MRN: [id number] Date of Birth: [birth year]  Today's Date: 09/23/2019 PT Missed Time: 60 Minutes Missed Time Reason: CT/MRI  Patient not in room and per RN she is getting an MRI. Missed 70minutes of skilled physical therapy.   Tawana Scale, PT, DPT 09/23/2019, 11:16 AM

## 2019-09-23 NOTE — Progress Notes (Signed)
Team Conference Report to Patient/Family  Team Conference discussion was reviewed with the patient and daughter, including goals, any changes in plan of care and target discharge date.  Patient and daughter expressed understanding and are in agreement.  The patient has a target discharge date of 09/30/19. Son to come in for education on Sunday and return with patient's daughter Monday in prep for discharge on Wednesday. [place] SLP added for home services.   Dorien Chihuahua B 09/23/2019, 4:13 PM

## 2019-09-23 NOTE — Patient Care Conference (Signed)
Inpatient RehabilitationTeam Conference and Plan of Care Update Date: 09/23/2019   Time: 11:00 AM    Patient Name: Renee [name]      Medical Record Number: [id number]  Date of Birth: [birth year] Sex: Female         Room/Bed: 4W15C/4W15C-01 Payor Info: Payor: [place] / Plan: [employer]BS MEDICARE / Product Type: *No Product type* /    Admit Date/Time:  09/09/2019 12:32 AM  Primary Diagnosis:  Mass of parietal lobe  Patient Active Problem List   Diagnosis Date Noted  . Anxiety state   . Labile blood glucose   . Entrapment of right ulnar nerve   . Hypotension   . Neuropathic pain   . Pain of right scapula   . Cognitive deficits   . Neurogenic bladder   . CKD (chronic kidney disease), stage II   . Malnutrition of moderate degree 09/10/2019  . Leukocytosis   . Elevated BUN   . Mass of parietal lobe 09/09/2019  . Spastic hemiparesis ([place])   . Iron deficiency anemia   . Drug induced constipation   . Steroid-induced hyperglycemia   . Prolonged Q-T interval on ECG   . 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 Right sided weakness 09/03/2019  . Constipation 07/08/2019  . GERD (gastroesophageal reflux disease) 07/08/2019  . History of breast cancer 12/27/2017  . Chronic renal disease, stage 3, moderately decreased glomerular filtration rate between 30-59 mL/min/1.73 square meter 12/27/2017  . CLL (chronic lymphocytic leukemia) ([place]) 02/21/2017  . Chronic depression 05/14/2015  . Rectal pain, chronic 02/10/2015  . Chronic pain 12/28/2013  . 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 Hypothyroidism 01/07/2013  . Vitamin D deficiency 01/07/2013    Expected Discharge Date: Expected Discharge Date: 09/30/19  Team Members Present: Physician leading conference: Dr. Delice Lesch Social Worker Present: Lennart Pall, LCSW Nurse Present: Dorien Chihuahua, RN;Elena Paulita Cradle, RN Case Manager: Karene Fry, RN PT Present: Donnamae Jude, PT OT Present: Meriel Pica, OT SLP Present: Weston Anna, SLP PPS Coordinator present : Gunnar Fusi, SLP     Current Status/Progress Goal Weekly Team Focus  Bowel/Bladder   continent of B/B, LBM 1/19  remain continent  assess toileting q shift and prn   Swallow/Nutrition/ Hydration             ADL's   squat pivot transfers have been improving to mod A BUT she is weaker in trunk control and R side strength.  continues to need mod -max with self care.  STM is also decreasing, very poor recall.  goals downgraded to mod A.  ADL training, family ed, RUE NMR, postural control   Mobility   min assist bed mobility, mod assist squat pivot transfers, mod assist sit<>stand in // bars, patient has noted to have a decline in R hemibody strength affecting her progression with functional mobility  min assist overall  R hemibody NMR, standing balance, sitting balance, bed<>chair transfers, bed mobility, pt/family education, activity tolerance, discharge planning   Communication             Safety/Cognition/ Behavioral Observations  Min A  Supervision  complex problem solving, recall with use of strategies, anticipatory awareness   Pain   chronic rectum pain, pain between shoulder blades, has scheduled and prns  pain less than 4  assess pain q shift and prn   Skin   skin intact, just generalized bruising BUE  prevent skin  breakdown  assess skin q shift and prn    Rehab Goals Patient on target to meet rehab goals: (P) No Rehab Goals Revised: (P) Cognitive decline and continued right side functional limitations affecting progress; may downgrade goals *See Care Plan and progress notes for long and short-term goals.     Barriers to Discharge  Current Status/Progress Possible Resolutions Date Resolved   Nursing                  PT                    OT                  SLP                SW   Daughter has arranged 24/7 care to assist the Daughter/Son with care Family setting up  home for 24/7 care, acquiring equipment and family education scheduled          Discharge Planning/Teaching Needs:  (P) Plan for home with 24/7 assistance from hired caregivers assisting adult children with care      Team Discussion: Monitoring BP, RUE numbness this am.  RN R side weaker, BS stable.  OT gradual decline R side, decreased memory.  PT no gait this week, mod bed and transfers, introduce slide board, w/c mobility work, hurts bottom to sit up, dtr getting a steady, custom AFO today, will have 247 support.  SLP impaired memory, ?palliative care consult, MD will ask family about consult.  Fam ed Sun/Mon.  MD to get MRI today.   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 Weekly Focus/Goal: Improve mobility, weakness, pain, bowel movement, hypotension  Barriers to Discharge: Medical stability;Other (comments)  Barriers to Discharge Comments: Chronic pain, ?IBS Possible Resolutions to Barriers: Therapies, follow labs, optimize pain meds, optimize bowel meds, follow weakness   Continued Need for Acute Rehabilitation Level of Care: The patient requires daily medical management by a physician with specialized training in physical medicine and rehabilitation for the following reasons: Direction of a multidisciplinary physical rehabilitation program to maximize functional independence : Yes Medical management of patient stability for increased activity during participation in an intensive rehabilitation regime.: Yes Analysis of laboratory values and/or radiology reports with any subsequent need for medication adjustment and/or medical intervention. : Yes   I attest that I was present, lead the team conference, and concur with the assessment and plan of the team.   Retta Diones 09/23/2019, 8:57 PM   Team conference was held via web/ teleconference due to  [place] - 19

## 2019-09-23 NOTE — Progress Notes (Signed)
Speech Language Pathology Daily Session Note  Patient Details  Name: Renee [name] MRN: [id number] Date of Birth: 1946-04-18  Today's Date: 09/23/2019 SLP Individual Time: 1030-1055 SLP Individual Time Calculation (min): 25 min  Short Term Goals: Week 1: SLP Short Term Goal 1 (Week 1): STGs=LTGs due to ELOS  Skilled Therapeutic Interventions: Skilled treatment session focused on cognitive goals. SLP facilitated session by providing external memory aids as well as simplified instructions in order to maximize recall of functional information as it relates to bed mobility.  Patient able to carryover information throughout session but will assess success of memory aid during next session. Patient left upright in bed with alarm on and all needs within reach. Continue with current plan of care.      Pain No/Denies Pain   Therapy/Group: Individual Therapy  Regie Bunner 09/23/2019, 11:11 AM

## 2019-09-24 ENCOUNTER — Inpatient Hospital Stay (HOSPITAL_COMMUNITY): Payer: Medicare Other | Admitting: Physical Therapy

## 2019-09-24 ENCOUNTER — Inpatient Hospital Stay (HOSPITAL_COMMUNITY): Payer: Medicare Other | Admitting: Speech Pathology

## 2019-09-24 ENCOUNTER — Inpatient Hospital Stay (HOSPITAL_COMMUNITY): Payer: Medicare Other | Admitting: Occupational Therapy

## 2019-09-24 LAB — GLUCOSE, CAPILLARY
Glucose-Capillary: 113 mg/dL — ABNORMAL HIGH (ref 70–99)
Glucose-Capillary: 125 mg/dL — ABNORMAL HIGH (ref 70–99)
Glucose-Capillary: 119 mg/dL — ABNORMAL HIGH (ref 70–99)
Glucose-Capillary: 141 mg/dL — ABNORMAL HIGH (ref 70–99)

## 2019-09-24 LAB — CBC
HCT: 35.6 % — ABNORMAL LOW (ref 36.0–46.0)
Hemoglobin: 10.7 g/dL — ABNORMAL LOW (ref 12.0–15.0)
MCH: 26.4 pg (ref 26.0–34.0)
MCHC: 30.1 g/dL (ref 30.0–36.0)
MCV: 87.7 fL (ref 80.0–100.0)
Platelets: 197 10*3/uL (ref 150–400)
RBC: 4.06 MIL/uL (ref 3.87–5.11)
RDW: 17 % — ABNORMAL HIGH (ref 11.5–15.5)
WBC: 84.3 10*3/uL (ref 4.0–10.5)
nRBC: 0 % (ref 0.0–0.2)

## 2019-09-24 MED ORDER — DEXAMETHASONE 4 MG PO TABS
4.0000 mg | ORAL_TABLET | Freq: Every day | ORAL | Status: AC
  Administered 2019-09-25 – 2019-09-28 (×4): 4 mg via ORAL
  Filled 2019-09-24 (×4): qty 1

## 2019-09-24 MED ORDER — LIDOCAINE 5 % EX PTCH
2.0000 | MEDICATED_PATCH | CUTANEOUS | Status: DC
  Administered 2019-09-25 – 2019-09-29 (×5): 2 via TRANSDERMAL
  Filled 2019-09-24 (×7): qty 2

## 2019-09-24 MED ORDER — DEXAMETHASONE 2 MG PO TABS: 1.0000 mg | ORAL_TABLET | Freq: Every day | ORAL | Status: DC

## 2019-09-24 MED ORDER — DEXAMETHASONE 2 MG PO TABS
2.0000 mg | ORAL_TABLET | Freq: Every day | ORAL | Status: DC
  Administered 2019-09-29 – 2019-09-30 (×2): 2 mg via ORAL
  Filled 2019-09-24 (×3): qty 1

## 2019-09-24 MED ORDER — DEXAMETHASONE 2 MG PO TABS: 1.0000 mg | ORAL_TABLET | Freq: Two times a day (BID) | ORAL | Status: DC

## 2019-09-24 NOTE — Consult Note (Signed)
Neurology Consultation  Reason for Consult: MRI findings Referring Physician: Posey Pronto  CC: Worsening strength with new MRI findings  History is obtained from: Chart  HPI: Renee [name] is a 74 y.o. female with history of leukemia, hypertension, breast cancer and chronic renal disease stage III.  Neurology was initially consulted for subacute posterior medial left parietal frontal lobe lesion on MRI back on 09/04/2019.  At that time initial MRI had differential most consistent with cerebritis or malignancy.  In addition PML was also in the differential.  LP was obtained which was unremarkable for infection and at that time did not point toward malignant or other cause however the only definitive diagnosis was to obtain sampling with biopsy.  EEG was also obtained at that time showing left parietal region slowing consistent with lesion seen on MRI.  Neuro oncology was also involved and clinical significant involvement of the CNS and CLL is very uncommon.  At that time neurology for to neuro-oncology whether or not patient needed inpatient biopsy versus outpatient biopsy following CSF labs. On 09/09/2019 patient was then admitted to inpatient rehab.  While in inpatient rehab per notes patient's right lower extremity has continually worsened in strength fromHF, KE 3 -/5, ADF 2/5,  to HF, KE 1/5, ADF 0/5.   Repeat MRI of brain was obtained today.  The image appearance given that CSF was positive for JC virus image is more  compatible with PML and inconsistent with typical low-grade glioma.   Labs obtained: JC virus PCR of CSF was positive CSF cytology showed no malignancy CSF culture was negative CSF glucose 79, CSF red blood cells 89, CSF WBC 1, CSF total protein 27  Neurology was asked to follow-up with patient while in CIR due to these new findings  Past Medical History:  Diagnosis Date  . Chronic renal disease, stage 3, moderately decreased glomerular filtration rate between 30-59 mL/min/1.73 square  meter 12/27/2017  . Constipation   . Depression   . History of breast cancer 12/27/2017  . Hypertension   . Leukemia ([place])    CLL  . PVD (peripheral vascular disease) ([place])   . Thyroid disease     Family History  Problem Relation Age of Onset  . Healthy Mother   . Healthy Father   . Healthy Sister        "normal stuff for her age"  . Pancreatic cancer Brother    Social History:   reports that she quit smoking about 40 years ago. Her smoking use included cigarettes. She started smoking about 57 years ago. She smoked 0.75 packs per day. She has never used smokeless tobacco. She reports that she does not drink alcohol or use drugs.  Medications  Current Facility-Administered Medications:  .  acetaminophen (TYLENOL) tablet 325-650 mg, 325-650 mg, Oral, Q4H PRN, Bary Leriche, PA-C, 650 mg at 09/21/19 1738 .  alum & mag hydroxide-simeth (MAALOX/MYLANTA) 200-200-20 MG/5ML suspension 30 mL, 30 mL, Oral, Q4H PRN, Love, Pamela S, PA-C .  baclofen (LIORESAL) tablet 10 mg, 10 mg, Oral, TID, Jamse Arn, MD, 10 mg at 09/24/19 0753 .  bisacodyl (DULCOLAX) suppository 10 mg, 10 mg, Rectal, Daily PRN, Love, Pamela S, PA-C .  clonazePAM (KLONOPIN) tablet 0.5 mg, 0.5 mg, Oral, BID, Love, Pamela S, PA-C, 0.5 mg at 09/24/19 0753 .  dexamethasone (DECADRON) tablet 4 mg, 4 mg, Oral, Q12H, Love, Pamela S, PA-C, 4 mg at 09/24/19 0615 .  diclofenac Sodium (VOLTAREN) 1 % topical gel 2 g, 2 g, Topical,  QID, Jamse Arn, MD, 2 g at 09/24/19 0757 .  diphenhydrAMINE (BENADRYL) 12.5 MG/5ML elixir 12.5-25 mg, 12.5-25 mg, Oral, Q6H PRN, Love, Pamela S, PA-C .  enoxaparin (LOVENOX) injection 40 mg, 40 mg, Subcutaneous, Q24H, Love, Pamela S, PA-C, 40 mg at 09/24/19 1004 .  feeding supplement (ENSURE ENLIVE) (ENSURE ENLIVE) liquid 237 mL, 237 mL, Oral, TID WC, Patel, Domenick Bookbinder, MD, 237 mL at 09/24/19 0757 .  guaiFENesin-dextromethorphan (ROBITUSSIN DM) 100-10 MG/5ML syrup 5-10 mL, 5-10 mL, Oral, Q6H PRN,  Love, Pamela S, PA-C .  hydrocortisone (ANUSOL-HC) suppository 25 mg, 25 mg, Rectal, BID, Kirsteins, Luanna Salk, MD, 25 mg at 09/24/19 0753 .  insulin aspart (novoLOG) injection 0-5 Units, 0-5 Units, Subcutaneous, QHS, Love, Pamela S, PA-C .  insulin aspart (novoLOG) injection 0-6 Units, 0-6 Units, Subcutaneous, TID WC, Love, Pamela S, PA-C .  lactulose (CHRONULAC) 10 GM/15ML solution 10 g, 10 g, Oral, BID, Love, Pamela S, PA-C, 10 g at 09/24/19 0752 .  levothyroxine (SYNTHROID) tablet 100 mcg, 100 mcg, Oral, QAC breakfast, Love, Pamela S, PA-C, 100 mcg at 09/24/19 0615 .  [START ON 09/25/2019] lidocaine (LIDODERM) 5 % 2 patch, 2 patch, Transdermal, Q24H, Love, Pamela S, PA-C .  linaclotide (LINZESS) capsule 145 mcg, 145 mcg, Oral, QAC breakfast, Raulkar, Clide Deutscher, MD, 145 mcg at 09/24/19 [id number] .  lurasidone (LATUDA) tablet 40 mg, 40 mg, Oral, QHS, Love, Pamela S, PA-C, 40 mg at 09/23/19 2155 .  Melatonin TABS 3 mg, 3 mg, Oral, QHS, Love, Pamela S, PA-C, 3 mg at 09/23/19 2155 .  methadone (DOLOPHINE) tablet 10 mg, 10 mg, Oral, Daily, 10 mg at 09/24/19 0753 **AND** methadone (DOLOPHINE) tablet 10 mg, 10 mg, Oral, q1800, Jamse Arn, MD, 10 mg at 09/23/19 1844 .  methadone (DOLOPHINE) tablet 10 mg, 10 mg, Oral, Q1200, Jamse Arn, MD, 10 mg at 09/23/19 1435 .  oxybutynin (DITROPAN) tablet 5 mg, 5 mg, Oral, QHS, Kirsteins, Luanna Salk, MD, 5 mg at 09/23/19 2155 .  pantoprazole (PROTONIX) EC tablet 40 mg, 40 mg, Oral, BID, Love, Pamela S, PA-C, 40 mg at 09/24/19 0753 .  polyethylene glycol (MIRALAX / GLYCOLAX) packet 17 g, 17 g, Oral, Daily PRN, Bary Leriche, PA-C, 17 g at 09/15/19 0756 .  pregabalin (LYRICA) capsule 50 mg, 50 mg, Oral, BID, Jamse Arn, MD, 50 mg at 09/24/19 0753 .  prochlorperazine (COMPAZINE) tablet 5-10 mg, 5-10 mg, Oral, Q6H PRN **OR** prochlorperazine (COMPAZINE) injection 5-10 mg, 5-10 mg, Intramuscular, Q6H PRN **OR** prochlorperazine (COMPAZINE) suppository 12.5  mg, 12.5 mg, Rectal, Q6H PRN, Love, Pamela S, PA-C .  rosuvastatin (CRESTOR) tablet 20 mg, 20 mg, Oral, q1800, Love, Pamela S, PA-C, 20 mg at 09/23/19 1846 .  senna-docusate (Senokot-S) tablet 1 tablet, 1 tablet, Oral, QHS, Jamse Arn, MD, 1 tablet at 09/23/19 2155 .  sodium phosphate (FLEET) 7-19 GM/118ML enema 1 enema, 1 enema, Rectal, Once PRN, Love, Pamela S, PA-C .  witch hazel-glycerin (TUCKS) pad, , Topical, TID AC & HS, Kirsteins, Luanna Salk, MD, 1 application at [id number]  ROS:     General ROS: negative for - chills, fatigue, fever, night sweats, weight gain or weight loss Psychological ROS: negative for - behavioral disorder, hallucinations, memory difficulties, mood swings or suicidal ideation Ophthalmic ROS: negative for - blurry vision, double vision, eye pain or loss of vision ENT ROS: negative for - epistaxis, nasal discharge, oral lesions, sore throat, tinnitus or vertigo Respiratory ROS: negative for - cough, hemoptysis,  shortness of breath or wheezing Cardiovascular ROS: negative for - chest pain, dyspnea on exertion, edema or irregular heartbeat Gastrointestinal ROS: negative for - abdominal pain, diarrhea, hematemesis, nausea/vomiting or stool incontinence Genito-Urinary ROS: negative for - dysuria, hematuria, incontinence or urinary frequency/urgency Musculoskeletal ROS: Positive for - muscular weakness Neurological ROS: as noted in HPI Dermatological ROS: negative for rash and skin lesion changes  Exam: Current vital signs: BP (!) 101/50 (BP Location: Left Arm)   Pulse (!) 52   Temp 98.1 F (36.7 C) (Oral)   Resp 16   Wt 78.9 kg   SpO2 97%   BMI 22.95 kg/m  Vital signs in last 24 hours: Temp:  [98.1 F (36.7 C)] 98.1 F (36.7 C) (01/21 0501) Pulse Rate:  [52-62] 52 (01/21 0501) Resp:  [16-18] 16 (01/21 0501) BP: (93-101)/(50-59) 101/50 (01/21 0501) SpO2:  [97 %-98 %] 97 % (01/21 0501)   Constitutional: Appears well-developed and well-nourished.   Psych: Affect appropriate to situation Eyes: No scleral injection HENT: No OP obstrucion Head: Normocephalic.  Cardiovascular: Normal rate and regular rhythm.  Respiratory: Effort normal, non-labored breathing GI: Soft.  No distension. There is no tenderness.  Skin: WDI  Neuro: Mental Status: Patient is awake, alert, oriented to person, place, month, year, and situation. Cranial Nerves: II: Visual Fields are full.  III,IV, VI: EOMI without ptosis or diploplia. Pupils equal, round and reactive to light V: Facial sensation is symmetric to temperature VII: Facial movement is symmetric.  VIII: hearing is intact to voice X: Palat elevates symmetrically XI: Shoulder shrug is symmetric. XII: tongue is midline without atrophy or fasciculations.  Motor: Decreased bulk on the right lower extremity, left lower extremity 5/5, RUE left upper extremity 5/5 Right upper extremity: Shows abduction 0/5, bicep flexion 4 -/5, tricep extension 3/5 Right lower extremity: Hip flexion 0/5, knee extension 1/5, knee flexion 0/5, dorsiflexion and plantar flexion 0/5 Sensory: Sensation is slightly decreased on the right  Deep Tendon Reflexes: 2+ bilateral upper extremities no knee jerk or ankle jerk Plantars: Mute Cerebellar: FNF intact on the left.  Attempted on the right but was unable to secondary to weakness  Labs I have reviewed labs in epic and the results pertinent to this consultation are:   CBC    Component Value Date/Time   WBC 84.3 (HH) 09/24/2019 0548   RBC 4.06 09/24/2019 0548   HGB 10.7 (L) 09/24/2019 0548   HCT 35.6 (L) 09/24/2019 0548   PLT 197 09/24/2019 0548   MCV 87.7 09/24/2019 0548   MCH 26.4 09/24/2019 0548   MCHC 30.1 09/24/2019 0548   RDW 17.0 (H) 09/24/2019 0548   LYMPHSABS 105.5 (H) 09/11/2019 0648   MONOABS 2.8 (H) 09/11/2019 0648   EOSABS 0.1 09/11/2019 0648   BASOSABS 0.2 (H) 09/11/2019 0648    CMP     Component Value Date/Time   NA 139 09/21/2019 0553    K 4.3 09/21/2019 0553   CL 106 09/21/2019 0553   CO2 24 09/21/2019 0553   GLUCOSE 102 (H) 09/21/2019 0553   BUN 18 09/21/2019 0553   CREATININE 1.02 (H) 09/21/2019 0553   CALCIUM 8.6 (L) 09/21/2019 0553   PROT 6.5 09/10/2019 0515   ALBUMIN 3.7 09/10/2019 0515   AST 17 09/10/2019 0515   ALT 16 09/10/2019 0515   ALKPHOS 66 09/10/2019 0515   BILITOT 0.9 09/10/2019 0515   GFRNONAA 55 (L) 09/21/2019 0553   GFRAA >60 09/21/2019 0553    Lipid Panel     Component Value  Date/Time   CHOL 110 09/04/2019 0416   TRIG 117 09/04/2019 0416   HDL 35 (L) 09/04/2019 0416   CHOLHDL 3.1 09/04/2019 0416   VLDL 23 09/04/2019 0416   LDLCALC 52 09/04/2019 0416     Imaging I have reviewed the images obtained:   MRI examination of the brain-patient has positive CSF JC virus that was found on 09/06/2019.  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   Assessment: 74 yo F with progressive multifocal(discontinuous) lesion localized in the left posterior quadrant. 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 I have discussed this with Dr. Mickeal Skinner of neuro-oncology who does not feel that this is consistent with malignancy and therefore no biopsy is needed at this point.  Treatment of PML is limited, but typically focuses on treating the underlying cause of immune suppression. In her her case, unfortunately, any treatment of her CLL does carry some risk of immune suppression.   I think hematology opinion regarding this would be helpful.   Recommendations: 1) Dr. Mickeal Skinner to discuss with  hematology 2) Decrease decadron to 4mg  daily x 5 days, then 2mg  daily x 5 days, then 1mg  x 5 days, then stop.  3) Palliative care consultation(Already called) 4) will follow.    Roland Rack, MD [facility] [phone]  If 7pm- 7am, please page neurology on call as listed in [place].

## 2019-09-24 NOTE — Progress Notes (Signed)
[place] PHYSICAL MEDICINE & REHABILITATION PROGRESS NOTE  Subjective/Complaints: Patient seen sitting up in bed this morning.  She states she slept fairly overnight, her usual.  Discussed with patient and daughter palliative care consult.  ROS: Denies CP, SOB, N/V/D  Objective: Vital Signs: Blood pressure (!) 101/50, pulse (!) 52, temperature 98.1 F (36.7 C), temperature source Oral, resp. rate 16, weight 78.9 kg, SpO2 97 %. MR BRAIN W WO CONTRAST  Result Date: 09/23/2019 CLINICAL DATA:  Parietal tumor, follow-up EXAM: MRI HEAD WITHOUT AND WITH CONTRAST TECHNIQUE: Multiplanar, multiecho pulse sequences of the brain and surrounding structures were obtained without and with intravenous contrast. CONTRAST:  15mL GADAVIST GADOBUTROL 1 MMOL/ML IV SOLN COMPARISON:  09/04/2018 FINDINGS: Brain: Abnormal T2 hyperintensity is again identified primarily within the left parietal lobe with some extension into the posterior frontal lobe. Extent has increased. There is notable involvement of the postcentral gyrus and paracentral lobule as well as the medial postcentral gyrus. This extends to the superior margin of the posterior body of the left lateral ventricle. Additionally, there is slightly discontiguous T2 hyperintensity within the left precentral gyrus hand motor region and along the periventricular white matter (best seen on diffusion series 5, images 76 and 90). Associated mass effect is mild. There is no abnormal enhancement. There is a small area of diffusion hyperintensity in the contralateral paracentral right frontoparietal region (series 5, image 86). This is new but not clearly visualized on other sequences. There is no evidence of hemorrhage. No hydrocephalus or extra-axial fluid collection. Vascular: Major vessel flow voids at the skull base are preserved. Skull and upper cervical spine: Normal marrow signal is preserved. Sinuses/Orbits: Paranasal sinuses are aerated. Orbits are unremarkable.  Other: Sella is unremarkable.  Mastoid air cells are clear. IMPRESSION: Increase in extent of nonenhancing abnormal signal centered within the left parietal lobe as detailed above. There are new foci of discontiguous abnormal signal for which attention on follow-up is recommended, noting that both infiltrating tumor and ischemia are in the differential. 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 Close follow-up is recommended given change over short interval. Electronically Signed   By: Macy Mis M.D.   On: 09/23/2019 15:41   Recent Labs    09/24/19 0548  WBC 84.3*  HGB 10.7*  HCT 35.6*  PLT 197   No results for input(s): NA, K, CL, CO2, GLUCOSE, BUN, CREATININE, CALCIUM in the last 72 hours.  Physical Exam: BP (!) 101/50 (BP Location: Left Arm)   Pulse (!) 52   Temp 98.1 F (36.7 C) (Oral)   Resp 16   Wt 78.9 kg   SpO2 97%   BMI 22.95 kg/m  Constitutional: No distress . Vital signs reviewed. HENT: Normocephalic.  Atraumatic. Eyes: EOMI. No discharge. Cardiovascular: No JVD. Respiratory: Normal effort.  No stridor. GI: Non-distended. Skin: Warm and dry.  Intact. Psych: Normal mood.  Normal behavior. Musc: No edema in extremities.  No tenderness in extremities. Neuro: Alert Motor:  RUE: Shoulder abduction, elbow flexion/extension 4 -/5, handgrip 4+/5 Left upper extremity: 5/5 proximal distal, unchanged RLE: HF, KE 1/5, ADF 0/5, unchanged Left lower extremity: Hip flexion, knee extension 4+/5, ankle dorsiflexion 5/5  Assessment/Plan: 1. 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  Physiatrist is providing close team supervision and 24 hour management of active medical problems listed below.  Physiatrist and rehab team continue to assess barriers to discharge/monitor patient progress toward  functional and medical goals  Care  Tool:  Bathing  Bathing activity did not occur: Refused Body parts bathed by patient: Right arm, Chest, Abdomen, Right upper leg, Left upper leg, Front perineal area, Face, Buttocks   Body parts bathed by helper: Right lower leg, Left lower leg, Left arm Body parts n/a: Front perineal area, Buttocks   Bathing assist Assist Level: Moderate Assistance - Patient 50 - 74%     Upper Body Dressing/Undressing Upper body dressing   What is the patient wearing?: Pull over shirt    Upper body assist Assist Level: Minimal Assistance - Patient > 75%    Lower Body Dressing/Undressing Lower body dressing      What is the patient wearing?: Underwear/pull up, Pants     Lower body assist Assist for lower body dressing: Maximal Assistance - Patient 25 - 49%     Toileting Toileting    Toileting assist Assist for toileting: Moderate Assistance - Patient 50 - 74%     Transfers Chair/bed transfer  Transfers assist     Chair/bed transfer assist level: Minimal Assistance - Patient > 75%     Locomotion Ambulation   Ambulation assist      Assist level: 2 helpers Assistive device: Parallel bars Max distance: 3 ft   Walk 10 feet activity   Assist     Assist level: Total Assistance - Patient < 25% Assistive device: Lite Gait   Walk 50 feet activity   Assist Walk 50 feet with 2 turns activity did not occur: Safety/medical concerns  Assist level: Total Assistance - Patient < 25% Assistive device: Lite Gait    Walk 150 feet activity   Assist Walk 150 feet activity did not occur: Safety/medical concerns         Walk 10 feet on uneven surface  activity   Assist Walk 10 feet on uneven surfaces activity did not occur: Safety/medical concerns         Wheelchair     Assist   Type of Wheelchair: Manual    Wheelchair assist level: Minimal Assistance - Patient > 75% Max wheelchair distance: 150 ft    Wheelchair 50 feet with 2 turns  activity    Assist        Assist Level: Minimal Assistance - Patient > 75%   Wheelchair 150 feet activity     Assist Wheelchair 150 feet activity did not occur: Safety/medical concerns   Assist Level: Minimal Assistance - Patient > 75%      Medical Problem List and Plan:  1. 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  MRI showing increasing abnormal signal in in left parietal lobe with additional foci.  Discussed with Dr. Jamal Collin plans to discuss with patient today.  Neurosurgery also involved now, plan for neurosurgery follow-up today as well.  Discussed with patient and daughter plans for palliative care consult-ordered. 2. Antithrombotics:   -DVT/anticoagulation: Pharmaceutical: Lovenox   -antiplatelet therapy: n/a  3. Chronic pudendal pain/Pain Management: Cymbalta  Continue methadone bid, afternoon dose added, changed dosing to 10 mg 3 times daily  Monitor with increased mobility  ?Rash allergy to Lyrica-tolerating retrial, increased on 1/15  Baclofen 5 TID started on 1/8, increased on 1/13  Added anusol supp HCT BID  Cont Tucks pad to anal area QID  Continue ice  Relatively controlled with medications on 1/21  Will  consider further medication adjustments if necessary 4. Mood: LCSW to follow for evaluation and support.   Klonopin 0.5 twice daily for anxiety   Appears relatively controlled on 1/21  -antipsychotic agents: N/A  5. Neuropsych: This patient is ? fully capable of making decisions on her own behalf.  6. Skin/Wound Care: Routine pressure relief measures.  7. 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 Improved.   Creatinine 1.02 on 1/18  Encourage fluids  Cont to monitor 9. Hypothyroid: Resume supplement.  10. H/o  depression: On Lutuda, cymbalta and Clonazepam  11. 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  Recommendations to reduce decadron to 4 mg at discharge, will follow up on frequency.   On Keppra and decadron-->tapered to bid on 1/5  12. Steroid induced hyperglycemia: Hgb A1c-5.4.   Relatively controlled, with 1 slight elevation on 1/21  Monitor with increased mobility 13. 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 Breast cancer in remission/CLL being observed:   WBCs 84.3 on 1/21.   Discussed with Neuro/Onc and Heme/Onc.  Per Heme/Onc cont to monitor and no cause for concern unless acute spike.  Afebrile  Cont to monitor 14. Iron deficiently anemia: Resolved  Added iron supplement   Hb 10.7 on 1/21  Cont to monitor 15. Spasticity of R ankle  See #3  PRAFO  ?  Improving 16.  Neurogenic bladder  Continue ditropan qhs 17.  Right scapular pain  X-ray reviewed, unremarkable for fracture  Voltaren gel ordered on 1/13  Improved 18.  Hypotension  Asymptomatic at present  Stable on 1/21    LOS: 15 days A FACE TO FACE EVALUATION WAS PERFORMED  Afton Lavalle Lorie Phenix 09/24/2019, 8:43 AM

## 2019-09-24 NOTE — Plan of Care (Signed)
Problem: RH Balance Goal: LTG Patient will maintain dynamic sitting balance (PT) Description: LTG:  Patient will maintain dynamic sitting balance with assistance during mobility activities (PT) Flowsheets (Taken 09/24/2019 1827) LTG: Pt will maintain dynamic sitting balance during mobility activities with:: (downgraded based on pt progress) Contact Guard/Touching assist Note: downgraded based on pt progress Goal: LTG Patient will maintain dynamic standing balance (PT) Description: LTG:  Patient w Problem: Sit to Stand Goal: LTG:  Patient will perform sit to stand with assistance level (PT) Description: LTG:  Patient will perform sit to stand with assistance level (PT) Flowsheets (Taken 09/24/2019 1832) LTG: PT will perform sit to stand in preparation for functional mobility with assistance level: (in stedy) Minimal Assistance - Patient > 75%  ill maintain dynamic standing balance with assistance during mobility activities (PT) Flowsheets (Taken 09/24/2019 1827) LTG: Pt will maintain dynamic standing balance during mobility activities with:: (min assist in the stedy - downgraded based on pt progress) Minimal Assistance - Patient > 75% Note: downgraded based on pt progress   Problem: RH Bed Mobility Goal: LTG Patient will perform bed mobility with assist (PT) Description: LTG: Patient will perform bed mobility with assistance, with/without cues (PT). Flowsheets (Taken 09/24/2019 1827) LTG: Pt will perform bed mobility with assistance level of: (downgraded based on pt progress) Minimal Assistance - Patient > 75% Note: downgraded based on pt progress   Problem: RH Car Transfers Goal: LTG Patient will perform car transfers with assist (PT) Description: LTG: Patient will perform car transfers with assistance (PT). Flowsheets (Taken 09/24/2019 1827) LTG: Pt will perform car transfers with assist:: (downgraded based on pt progress) Moderate Assistance - Patient 50 - 74% Note: downgraded based on  pt progress   Problem: RH Ambulation Goal: LTG Patient will ambulate in controlled environment (PT) Description: LTG: Patient will ambulate in a controlled environment, # of feet with assistance (PT). Outcome: Not Applicable Flowsheets (Taken 09/24/2019 1827) LTG: Pt will ambulate in controlled environ  assist needed:: (discontinued as pt will not be a functional ambulator) -- Note: discontinued as pt will not be a functional ambulator Goal: LTG Patient will ambulate in home environment (PT) Description: LTG: Patient will ambulate in home environment, # of feet with assistance (PT). Outcome: Not Applicable Flowsheets (Taken 09/24/2019 1827) LTG: Pt will ambulate in home environ  assist needed:: (discontinued as pt will not be a functional ambulator) -- Note: discontinued as pt will not be a functional ambulator   Problem: RH Wheelchair Mobility Goal: LTG Patient will propel w/c in controlled environment (PT) Description: LTG: Patient will propel wheelchair in controlled environment, # of feet with assist (PT) Flowsheets (Taken 09/24/2019 1827) LTG: Pt will propel w/c in controlled environ  assist needed:: (downgraded based on pt progress) Contact Guard/Touching assist Note: downgraded based on pt progress Goal: LTG Patient will propel w/c in home environment (PT) Description: LTG: Patient will propel wheelchair in home environment, # of feet with assistance (PT). Flowsheets (Taken 09/24/2019 1827) LTG: Pt will propel w/c in home environ  assist needed:: (downgraded based on pt progress) Minimal Assistance - Patient > 75% LTG: Propel w/c distance in home environment: 23ft Note: downgraded based on pt progress   Problem: RH Stairs Goal: LTG Patient will ambulate up and down stairs w/assist (PT) Description: LTG: Patient will ambulate up and down # of stairs with assistance (PT) Outcome: Not Applicable Flowsheets (Taken 09/24/2019 1827) LTG: Pt will ambulate up/down stairs assist needed::  (discontinued as pt will not be a functional ambulator) --  Note: discontinued as pt will not be a functional ambulator

## 2019-09-24 NOTE — Progress Notes (Signed)
Physical Therapy Weekly Progress Note  Patient Details  Name: Renee [name] MRN: [id number] Date of Birth: 05-May-1946  Beginning of progress report period: October 16, 2019 End of progress report period: September 24, 2019  Today's Date: 09/24/2019 PT Individual Time: 1710-1748 PT Individual Time Calculation (min): 38 min   and  Today's Date: 09/24/2019 PT Missed Time: 80 Minutes Missed Time Reason: Other (Comment)(personal - details below)  Patient has met 1 of 4 short term goals. Ms. Music has demonstrated progressive decline in R hemibody strength resulting in lack of progression with independence performing functional mobility tasks. Per MD note, based on pt's medical prognosis palliative care is being consulted. As a result her LTGs have been downgraded. She can perform supine<>sit with mod assist, squat pivot transfers with mod assist, sit<>stand in stedy with mod assist, and 170f w/c propulsion with min assist.   Patient continues to demonstrate the following deficits muscle weakness, muscle joint tightness and muscle paralysis, decreased cardiorespiratoy endurance, impaired timing and sequencing, abnormal tone, unbalanced muscle activation, motor apraxia, decreased coordination and decreased motor planning, decreased attention to right, decreased attention, decreased awareness, decreased problem solving, decreased memory and delayed processing and decreased sitting balance, decreased standing balance, decreased postural control and decreased balance strategies and therefore will continue to benefit from skilled PT intervention to increase functional independence with mobility.  Patient not progressing toward long term goals.  See goal revision. Long term goals have been downgraded. Plan of care revisions: Due to pt's medical prognosis palliative care has been consulted. Pending pt's medical progression plan will still be do D/C home with 24hr support..  PT Short Term Goals Week 2:  PT  Short Term Goal 1 (Week 2): Pt will perform supine<>sit with CGA PT Short Term Goal 1 - Progress (Week 2): Not met PT Short Term Goal 2 (Week 2): Pt will perform bed<>chair transfer with min assist PT Short Term Goal 2 - Progress (Week 2): Not met PT Short Term Goal 3 (Week 2): Pt will ambulate at least 23fusing LRAD with mod assist of 1 and +2 w/c follow PT Short Term Goal 3 - Progress (Week 2): Not met PT Short Term Goal 4 (Week 2): Pt will perform at least 5074ff w/c propulsion with min assist PT Short Term Goal 4 - Progress (Week 2): Met Week 3:  PT Short Term Goal 1 (Week 3): = to LTGs based on ELOS  Skilled Therapeutic Interventions/Progress Updates:  Ambulation/gait training;Discharge planning;Functional mobility training;Psychosocial support;Visual/perceptual remediation/compensation;Therapeutic Activities;Balance/vestibular training;Disease management/prevention;Neuromuscular re-education;Skin care/wound management;Therapeutic Exercise;Wheelchair propulsion/positioning;UE/LE Strength taining/ROM;Splinting/orthotics;Pain management;DME/adaptive equipment instruction;Cognitive remediation/compensation;Community reintegration;Patient/family education;Stair training;UE/LE Coordination activities;Functional electrical stimulation   Session 1: Pt received supine in bed appearing to be tearful and upset - pt's daughter, BonHorris Latinooted to be outside pt's room talking on the phone. Upon therapist's questioning pt reports that she was just assisted back to bed as she had been sitting up for quiet some time and is currently awaiting the MD to come speak with her and her daughter regarding MRI results. Pt becomes tearful and reports she is very concerned and doesn't know what is going on and that she was told her and her daughter need to be in her room to meet with the MD when he arrives. Pt's daughter entering the room and confirming that they are waiting to speak with MD about pt's imaging results. Pt  requesting to rest in bed and spend this time with her daughter prior to MD's arrival. Pt left supine in bed  with her daughter present, needs in reach, and bed alarm on. Missed 32mnutes of skilled physical therapy.   Session 2: Pt received supine in bed, alone reporting that the plan is for her to have palliative care consult and pt reports she is not sure when/if she will be able to go home. Despite this, pt agreeable to therapy session stating she wants to do something besides lay in the bed for the rest of the evening. Supine>sit with mod assist for R hemibody management and trunk upright. Sitting EOB with L UE support therapist donned B shoes max assist for time management. L squat pivot transfer EOB>w/c with min assist for pivoting hips and pt demonstrating improving recall of steps to sequence this transfer (anticipate due to strategies practiced during speech therapy session) resulting in less assistance to complete transfer. Transported to/from gym in w/c for energy conservation. Educated pt on L hemi-technique w/c propulsion and pt performed ~1233fwith min assist to prevent R veering throughout. Participated in sitting tolerance, sitting balance, and R attention task while participating in cognitive task of playing checkers with CGA for steadying when leaning forward to reach the top of the board and min cuing for R attention. Transported back to room. L squat pivot w/c>EOB with min/mod assist for lifting/pivoting hips. Sit>supine with mod assist for B LE management. Therapist set-up pt's meal tray and pt left with needs in reach and bed alarm on.  Therapy Documentation Precautions:  Precautions Precautions: Fall Precaution Comments: Significant weakness at RLE, impaired R UE proprioception Restrictions Weight Bearing Restrictions: No  Pain:   Session 1: No reports of pain.  Session 2: Reports some rectal pain during session, unrated, and RN provided pain medication.   Therapy/Group:  Individual Therapy  CaTawana ScalePT, DPT 09/24/2019, 7:56 AM

## 2019-09-24 NOTE — Progress Notes (Signed)
Speech Language Pathology Daily Session Note  Patient Details  Name: Renee [name] MRN: [id number] Date of Birth: 1946/07/16  Today's Date: 09/24/2019 SLP Individual Time: 1045-1110 SLP Individual Time Calculation (min): 25 min  Short Term Goals: Week 1: SLP Short Term Goal 1 (Week 1): STGs=LTGs due to ELOS  Skilled Therapeutic Interventions: Skilled treatment session focused on cognitive goals. Both the patient and her clinicians report that the patient independently utilized the visual aid created to maximize recall of steps to transfer to EOB.  Therefore, this session focused on creating an additional aid that focused on recall of steps for the transfer to a wheelchair. Patient required overall Min A verbal cues to recall steps with use of visual aid, suspect due to increased steps required to perform sequence. Patient left upright in wheelchair with alarm on and all needs within reach. Continue with current plan of care.       Pain No/Denies Pain   Therapy/Group: Individual Therapy  Azim Gillingham 09/24/2019, 12:42 PM

## 2019-09-24 NOTE — Progress Notes (Signed)
Occupational Therapy Session Note  Patient Details  Name: Renee [name] MRN: [id number] Date of Birth: [birth year]  Today's Date: 09/24/2019 OT Individual Time: 5170-0174 OT Individual Time Calculation (min): 75 min    Short Term Goals: Week 1:  OT Short Term Goal 1 (Week 1): Pt will don shirt with (S) OT Short Term Goal 1 - Progress (Week 1): Met OT Short Term Goal 2 (Week 1): Pt will demonstrate improved RUE proprioception by having 75% reaching accuracy OT Short Term Goal 2 - Progress (Week 1): Progressing toward goal OT Short Term Goal 3 (Week 1): Pt will don pants with min A OT Short Term Goal 3 - Progress (Week 1): Progressing toward goal OT Short Term Goal 4 (Week 1): Pt will transfer to [facility] with mod A OT Short Term Goal 4 - Progress (Week 1): Met Week 2:  OT Short Term Goal 1 (Week 2): Pt will complete squat pivot transfers to toilet with min A. OT Short Term Goal 2 (Week 2): Pt will be able to self cleanse post toileting with CGA to support balance. OT Short Term Goal 3 (Week 2): Pt will demonstrate improved dynamic sit balance to don pants over feet with min A. OT Short Term Goal 4 (Week 2): Pt will demonstrate lateral leans with min A to pull pants up/down hips with mod A.  Skilled Therapeutic Interventions/Progress Updates:    Pt received in bed ready for therapy.  Today focused on NMR of R side with strengthening while doing LB dressing from bed.   Supine: -R heel slides and hip abd/add with aarom -B knee twists to activate core muscles -crossing one foot over knee to don pants over R foot with CGA and then L foot independently -bridging and holding while pulling pants over hips 80% of the way -rolling onto L side with Supervision recalling strategies of bringing arm across body first -sidelying to sit with CG A pushing up with L arm   Sitting EOB: -pelvic weight shifts to L to correct sitting alignment   -adding in quick reaction time to work on righting  reactions -abdominal engagement as pt worked on righting reaction to correct balance if she leaned back too far or began to lose balance posteriorly.  Pt did extremely well with her exercises today.  Squat pivot to wc to L with min A by pushing R hand onto R thigh and L hand guiding to chair.  Sitting in w/c: Grooming, UB dressing   Discussed exercises to do at home.  Pt resting in wc with all needs met and belt alarm on.     Therapy Documentation Precautions:  Precautions Precautions: Fall Precaution Comments: Significant weakness at RLE, impaired R UE proprioception Restrictions Weight Bearing Restrictions: No  Pain: c/o L side plank pain in QL area.  Requested a lidocane patch. Informed RN.   ADL: ADL Eating: Set up Where Assessed-Eating: Bed level Grooming: Setup Where Assessed-Grooming: Sitting at sink Upper Body Bathing: Contact guard Where Assessed-Upper Body Bathing: Edge of bed Lower Body Bathing: Moderate assistance Where Assessed-Lower Body Bathing: Edge of bed, Bed level Upper Body Dressing: Minimal assistance Where Assessed-Upper Body Dressing: Edge of bed Lower Body Dressing: Maximal assistance Where Assessed-Lower Body Dressing: Edge of bed, Bed level Toileting: Maximal assistance Where Assessed-Toileting: Glass blower/designer: Moderate assistance Toilet Transfer Method: Squat pivot Toilet Transfer Equipment: Grab bars, Raised toilet seat   Therapy/Group: Individual Therapy  [place] 09/24/2019, 12:19 PM

## 2019-09-25 ENCOUNTER — Inpatient Hospital Stay (HOSPITAL_COMMUNITY): Payer: Medicare Other | Admitting: *Deleted

## 2019-09-25 ENCOUNTER — Inpatient Hospital Stay (HOSPITAL_COMMUNITY): Payer: Medicare Other | Admitting: Physical Therapy

## 2019-09-25 ENCOUNTER — Inpatient Hospital Stay (HOSPITAL_COMMUNITY): Payer: Medicare Other | Admitting: Occupational Therapy

## 2019-09-25 ENCOUNTER — Encounter (HOSPITAL_COMMUNITY): Payer: Self-pay | Admitting: Physical Medicine & Rehabilitation

## 2019-09-25 ENCOUNTER — Inpatient Hospital Stay (HOSPITAL_COMMUNITY): Payer: Medicare Other | Admitting: Speech Pathology

## 2019-09-25 DIAGNOSIS — Z888 Allergy status to other drugs, medicaments and biological substances status: Secondary | ICD-10-CM

## 2019-09-25 DIAGNOSIS — Z7189 Other specified counseling: Secondary | ICD-10-CM | POA: Insufficient documentation

## 2019-09-25 DIAGNOSIS — Z885 Allergy status to narcotic agent status: Secondary | ICD-10-CM

## 2019-09-25 DIAGNOSIS — Z87891 Personal history of nicotine dependence: Secondary | ICD-10-CM

## 2019-09-25 DIAGNOSIS — R4589 Other symptoms and signs involving emotional state: Secondary | ICD-10-CM | POA: Insufficient documentation

## 2019-09-25 DIAGNOSIS — Z853 Personal history of malignant neoplasm of breast: Secondary | ICD-10-CM

## 2019-09-25 DIAGNOSIS — A812 Progressive multifocal leukoencephalopathy: Secondary | ICD-10-CM

## 2019-09-25 DIAGNOSIS — M6281 Muscle weakness (generalized): Secondary | ICD-10-CM | POA: Insufficient documentation

## 2019-09-25 DIAGNOSIS — K117 Disturbances of salivary secretion: Secondary | ICD-10-CM | POA: Insufficient documentation

## 2019-09-25 DIAGNOSIS — R52 Pain, unspecified: Secondary | ICD-10-CM | POA: Insufficient documentation

## 2019-09-25 DIAGNOSIS — Z515 Encounter for palliative care: Secondary | ICD-10-CM | POA: Insufficient documentation

## 2019-09-25 DIAGNOSIS — C9210 Chronic myeloid leukemia, BCR/ABL-positive, not having achieved remission: Secondary | ICD-10-CM

## 2019-09-25 DIAGNOSIS — M25552 Pain in left hip: Secondary | ICD-10-CM

## 2019-09-25 DIAGNOSIS — Z9221 Personal history of antineoplastic chemotherapy: Secondary | ICD-10-CM

## 2019-09-25 LAB — GLUCOSE, CAPILLARY
Glucose-Capillary: 87 mg/dL (ref 70–99)
Glucose-Capillary: 123 mg/dL — ABNORMAL HIGH (ref 70–99)
Glucose-Capillary: 114 mg/dL — ABNORMAL HIGH (ref 70–99)
Glucose-Capillary: 144 mg/dL — ABNORMAL HIGH (ref 70–99)

## 2019-09-25 MED ORDER — ACETAMINOPHEN 325 MG PO TABS
650.0000 mg | ORAL_TABLET | Freq: Three times a day (TID) | ORAL | Status: DC
  Administered 2019-09-25 – 2019-09-30 (×13): 650 mg via ORAL
  Filled 2019-09-25 (×14): qty 2

## 2019-09-25 MED ORDER — LACTULOSE 10 GM/15ML PO SOLN
10.0000 g | Freq: Three times a day (TID) | ORAL | Status: DC
  Administered 2019-09-25 – 2019-09-27 (×6): 10 g via ORAL
  Filled 2019-09-25 (×6): qty 15

## 2019-09-25 NOTE — Progress Notes (Signed)
Subjective: No acute changes  Exam: Vitals:   09/25/19 0415 09/25/19 0605  BP: (!) 86/43 107/61  Pulse: (!) 57 67  Resp:    Temp: 97.6 F (36.4 C)   SpO2: 96% 100%   Gen: In bed, NAD Resp: non-labored breathing, no acute distress Abd: soft, nt  Neuro: MS: Awake, alert, interactive and appropriate CN: Pupils equal round reactive, face is mostly symmetric, though I question a mild right nasolabial fold flattening Motor: She has very little movement in either right arm or leg with decreased tone.  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  Though the original descriptions of PML was in the description of leukemic disorders, this is quite unusual.  I appreciate Dr. Renda Rolls assistance and hematology will be consulting today.  I have also asked infectious disease to comment on if there are any other immune deficiencies that might need to be assessed for that could predispose to this infection.  Unfortunately, I suspect that this is likely to be a rapidly progressive fatal illness, but appreciate any other thoughts from the other services.  Recommendations: 1) appreciate hematology, ID consultation 2) continue weaning Decadron 3) palliative care consultation 4) we will follow  Roland Rack, MD [facility] [phone]  If 7pm- 7am, please page neurology on call as listed in [place].

## 2019-09-25 NOTE — Plan of Care (Signed)
  Problem: RH Balance Goal: LTG: Patient will maintain dynamic sitting balance (OT) Description: LTG:  Patient will maintain dynamic sitting balance with assistance during activities of daily living (OT) Flowsheets (Taken 09/25/2019 1300) LTG: Pt will maintain dynamic sitting balance during ADLs with: (LTG downgraded due to disease progression.) Moderate Assistance - Patient 50 - 74%   Problem: RH Bathing Goal: LTG Patient will bathe all body parts with assist levels (OT) Description: LTG: Patient will bathe all body parts with assist levels (OT) Flowsheets (Taken 09/25/2019 1300) LTG: Pt will perform bathing with assistance level/cueing: (LTG downgraded due to disease progression.) Moderate Assistance - Patient 50 - 74% LTG: Position pt will perform bathing: Supine in bed   Problem: RH Dressing Goal: LTG Patient will perform upper body dressing (OT) Description: LTG Patient will perform upper body dressing with assist, with/without cues (OT). Flowsheets (Taken 09/25/2019 1300) LTG: Pt will perform upper body dressing with assistance level of: (LTG downgraded due to disease progression.) Minimal Assistance - Patient > 75% Goal: LTG Patient will perform lower body dressing w/assist (OT) Description: LTG: Patient will perform lower body dressing with assist, with/without cues in positioning using equipment (OT) Flowsheets (Taken 09/25/2019 1300) LTG: Pt will perform lower body dressing with assistance level of: (LTG downgraded due to disease progression) Maximal Assistance - Patient 25 - 49%   Problem: RH Toileting Goal: LTG Patient will perform toileting task (3/3 steps) with assistance level (OT) Description: LTG: Patient will perform toileting task (3/3 steps) with assistance level (OT)  Flowsheets (Taken 09/25/2019 1300) LTG: Pt will perform toileting task (3/3 steps) with assistance level: (LTG downgraded due to disease progression.) Maximal Assistance - Patient 25 - 49%   Problem: RH  Toilet Transfers Goal: LTG Patient will perform toilet transfers w/assist (OT) Description: LTG: Patient will perform toilet transfers with assist, with/without cues using equipment (OT) Flowsheets (Taken 09/25/2019 1300) LTG: Pt will perform toilet transfers with assistance level of: (LTG downgraded due to disease progression, mod A with STEDY) Moderate Assistance - Patient 50 - 74%

## 2019-09-25 NOTE — Progress Notes (Signed)
Physical Therapy Session Note  Patient Details  Name: Renee [name] MRN: [id number] Date of Birth: [birth year]  Today's Date: 09/25/2019 PT Individual Time: [id number] PT Individual Time Calculation (min): 48 min   Short Term Goals: Week 3:  PT Short Term Goal 1 (Week 3): = to LTGs based on ELOS  Skilled Therapeutic Interventions/Progress Updates:    Pt received supine in bed with her daughter, [name], present and pt agreeable to therapy session. Therapy session focused on educating daughter on proper bed positioning to support R hemibody, use of PRAFO, and custom AFO as well as importance of skin checks and repositioning for pressure relief. Educated daughter on how to properly assist patient with bed mobility and squat pivot transfers w/c<>EOB using bedrail. Supine>sit, HOB partially elevated and using bedrail, with mod assist for R hemibody management and trunk upright. Therapist educated pt's daughter on pt's need for L UE support to maintain sitting balance otherwise needing physical assistance. Squat pivot transfers EOB<>w/c with therapist providing min/mod assist for lifting/pivoting hips while demonstrating and educating daughter on proper positioning to provide assistance. Educated daughter on ensuring pt's hips land towards [facility] to avoid having to pull her up towards [place] once in supine to decrease caregiver burden as well as ensuring proper R LE positioning prior to performing transfer. Pt's daughter assisted pt in squat pivot transfers EOB<>w/c with therapist providing +2 mod assist for pivoting R to the w/c and then only +2 min assist for pivoting L back to bed. Therapist educated pt's daughter on proper technique to decrease her strain while assisting patient with these transfers.Pt's daughter assisted pt sit>supine with mod assist for B LE management. Therapist answered all questions at this time. Pt left supine in bed with needs in reach, her daughter present, and bed alarm on.   Therapy  Documentation Precautions:  Precautions Precautions: Fall Precaution Comments: Significant weakness at RLE, impaired R UE proprioception Restrictions Weight Bearing Restrictions: No  Pain: Reports some discomfort in R shoe while wearing custom AFO due to decreased toe space - donned different shoes with some relief - discussed trying larger shoes or wide shoes for increased comfort.   Therapy/Group: Individual Therapy  Tawana Scale, PT, DPT 09/25/2019, 6:12 PM

## 2019-09-25 NOTE — Progress Notes (Signed)
Occupational Therapy Weekly Progress Note  Patient Details  Name: Renee [name] MRN: [id number] Date of Birth: [birth year]  Beginning of progress report period: September 16, 2019 End of progress report period: September 25, 2019  Today's Date: 09/25/2019 OT Individual Time: 4580-9983 OT Individual Time Calculation (min): 60 min    Patient has met 0 of 4 short term goals.  Pt has lost significant strength this week due to disease progression.  She is now flaccid in RUE and cannot move arm at all on her own. Core strength also decreasing so requiring more A with sitting balance  Focus of therapy on family education to teach family how to assist pt at home.  Patient continues to demonstrate the following deficits: muscle weakness, decreased cardiorespiratoy endurance, abnormal tone, decreased attention to right, decreased problem solving, decreased memory and delayed processing and decreased sitting balance, decreased standing balance, decreased postural control, hemiplegia and decreased balance strategies and therefore will continue to benefit from skilled OT intervention to enhance overall performance with Reduce care partner burden.  Patient not progressing toward long term goals.  See goal revision..    Plan of care revisions: Problem: RH Balance Goal: LTG: Patient will maintain dynamic sitting balance (OT) Description: LTG:  Patient will maintain dynamic sitting balance with assistance during activities of daily living (OT) Flowsheets (Taken 09/25/2019 1300) LTG: Pt will maintain dynamic sitting balance during ADLs with: (LTG downgraded due to disease progression.) Moderate Assistance - Patient 50 - 74%   Problem: RH Bathing Goal: LTG Patient will bathe all body parts with assist levels (OT) Description: LTG: Patient will bathe all body parts with assist levels (OT) Flowsheets (Taken 09/25/2019 1300) LTG: Pt will perform bathing with assistance level/cueing: (LTG downgraded due to disease  progression.) Moderate Assistance - Patient 50 - 74% LTG: Position pt will perform bathing: Supine in bed   Problem: RH Dressing Goal: LTG Patient will perform upper body dressing (OT) Description: LTG Patient will perform upper body dressing with assist, with/without cues (OT). Flowsheets (Taken 09/25/2019 1300) LTG: Pt will perform upper body dressing with assistance level of: (LTG downgraded due to disease progression.) Minimal Assistance - Patient > 75% Goal: LTG Patient will perform lower body dressing w/assist (OT) Description: LTG: Patient will perform lower body dressing with assist, with/without cues in positioning using equipment (OT) Flowsheets (Taken 09/25/2019 1300) LTG: Pt will perform lower body dressing with assistance level of: (LTG downgraded due to disease progression) Maximal Assistance - Patient 25 - 49%   Problem: RH Toileting Goal: LTG Patient will perform toileting task (3/3 steps) with assistance level (OT) Description: LTG: Patient will perform toileting task (3/3 steps) with assistance level (OT)  Flowsheets (Taken 09/25/2019 1300) LTG: Pt will perform toileting task (3/3 steps) with assistance level: (LTG downgraded due to disease progression.) Maximal Assistance - Patient 25 - 49%   Problem: RH Toilet Transfers Goal: LTG Patient will perform toilet transfers w/assist (OT) Description: LTG: Patient will perform toilet transfers with assist, with/without cues using equipment (OT) Flowsheets (Taken 09/25/2019 1300) LTG: Pt will perform toilet transfers with assistance level of: (LTG downgraded due to disease progression, mod A with STEDY) Moderate Assistance - Patient 50 - 74%  OT Short Term Goals Week 2:  OT Short Term Goal 1 (Week 2): Pt will complete squat pivot transfers to toilet with min A. OT Short Term Goal 1 - Progress (Week 2): Not progressing OT Short Term Goal 2 (Week 2): Pt will be able to self cleanse post toileting with  CGA to support balance. OT  Short Term Goal 2 - Progress (Week 2): Not progressing OT Short Term Goal 3 (Week 2): Pt will demonstrate improved dynamic sit balance to don pants over feet with min A. OT Short Term Goal 3 - Progress (Week 2): Not progressing OT Short Term Goal 4 (Week 2): Pt will demonstrate lateral leans with min A to pull pants up/down hips with mod A. OT Short Term Goal 4 - Progress (Week 2): Not progressing Week 3:  OT Short Term Goal 1 (Week 3): STGs = LTGs (some goals downgraded to mod -max A and some discontinued on 09/25/19)  Skilled Therapeutic Interventions/Progress Updates:    Pt received in bed and spent first 30 min of session talking with patient about the recent news she has received of her disease progression. Pt had many concerns and worries and helped pt to process through her thoughts. Discussed her desires for how to manage her illness. She desires to go home with support of palliative care and 24/7 A from hired caregivers and A from family.   Pt did want to work on transfers and grooming at sink.  Pt has been able to actively bring arm across chest to prepare for rolling in bed but today she had no active movement in arm and had to move it passively with her L arm. Rolled to L and sat to EOB with min A.   Squat pivot to L with mod A to w/c. Sat at sink to complete oral care.  From w/c worked on [place] and had pt work on self ROM of RUE with cues.  Pt did not have any active movement.  Pt transferred back to bed to her L with mod A.  Pt set up in bed with all needs met. Bed alarm set.   Therapy Documentation Precautions:  Precautions Precautions: Fall Precaution Comments: Significant weakness at RLE, impaired R UE proprioception Restrictions Weight Bearing Restrictions: No    Vital Signs:   Pain:  No c/o pain today   ADL: ADL Eating: Set up Where Assessed-Eating: Bed level Grooming: Setup Where Assessed-Grooming: Sitting at sink Upper Body Bathing: Moderate  assistance Where Assessed-Upper Body Bathing: Bed level Lower Body Bathing: Maximal assistance Where Assessed-Lower Body Bathing: Bed level Upper Body Dressing: Minimal assistance Where Assessed-Upper Body Dressing: Wheelchair Lower Body Dressing: Maximal assistance Where Assessed-Lower Body Dressing: Bed level Toileting: Maximal assistance Where Assessed-Toileting: Glass blower/designer: Moderate assistance Toilet Transfer Method: Other (comment)(stedy) Toilet Transfer Equipment: Grab bars, Raised toilet seat   Therapy/Group: Individual Therapy  Ruth 09/25/2019, 1:12 PM

## 2019-09-25 NOTE — Consult Note (Signed)
[facility] for Infectious Disease       Reason for Consult: PML    Referring Physician: Dr. Leonel Ramsay  Principal Problem:   Mass of parietal lobe Active Problems:   Spastic hemiparesis (HCC)   Iron deficiency anemia   Drug induced constipation   Steroid-induced hyperglycemia   Prolonged Q-T interval on ECG   Low grade glioma of brain (HCC)   Malnutrition of moderate degree   Leukocytosis   Elevated BUN   CKD (chronic kidney disease), stage II   Neurogenic bladder   Pain of right scapula   Cognitive deficits   Neuropathic pain   Hypotension   Labile blood glucose   Entrapment of right ulnar nerve   Anxiety state   PML (progressive multifocal leukoencephalopathy) ([place])   . baclofen  10 mg Oral TID  . clonazePAM  0.5 mg Oral BID  . [START ON 10/04/2019] dexamethasone  1 mg Oral Daily  . [START ON 09/29/2019] dexamethasone  2 mg Oral Daily  . dexamethasone  4 mg Oral Daily  . diclofenac Sodium  2 g Topical QID  . enoxaparin (LOVENOX) injection  40 mg Subcutaneous Q24H  . feeding supplement (ENSURE ENLIVE)  237 mL Oral TID WC  . hydrocortisone  25 mg Rectal BID  . lactulose  10 g Oral BID  . levothyroxine  100 mcg Oral QAC breakfast  . lidocaine  2 patch Transdermal Q24H  . linaclotide  145 mcg Oral QAC breakfast  . lurasidone  40 mg Oral QHS  . Melatonin  3 mg Oral QHS  . methadone  10 mg Oral Daily   And  . methadone  10 mg Oral q1800  . methadone  10 mg Oral Q1200  . oxybutynin  5 mg Oral QHS  . pantoprazole  40 mg Oral BID  . pregabalin  50 mg Oral BID  . rosuvastatin  20 mg Oral q1800  . senna-docusate  1 tablet Oral QHS  . witch hazel-glycerin   Topical TID AC & HS    Recommendations: Will check labs including HIV, Sjogren's, ANA, CD4, CD8 for completeness.     Assessment: She has new onset PML in the setting of CML, untreated with no know immunosuppressive risk factors.  There are associations with autoimmune diseases as well, though she has  no history or family history of anything related to autoimmune disease.  DDx includes autoimmune such as RA, Sjogren's, dermatomyositis.  Immunodeficiency including idiopathic CD4 lymphopenia, CD8 lymphopenia.   Also HIV, which has not yet been checked.  With the CLL though, I am unsure how accurate the tests will be, particularly for CD4 and CD8.    Antibiotics: none  HPI: Renee [name] is a 74 y.o. female with weakness on her right side and MRi findings concerning for PML, confirmed by CSF findings with a positive JC virus.  She has CLL but has not been on any treatment.  History of breast cancer and was on tamoxifen, but has been several years.  No history of any autoimmune disease.  No dry eyes, no dry mouth, no rashes, no chronic myalgias, no joint swelling.    Review of Systems:  Constitutional: negative for fevers, chills, fatigue, malaise and anorexia Gastrointestinal: negative for nausea and diarrhea Integument/breast: negative for rash All other systems reviewed and are negative    Past Medical History:  Diagnosis Date  . Chronic renal disease, stage 3, moderately decreased glomerular filtration rate between 30-59 mL/min/1.73 square meter 12/27/2017  . Constipation   .  Depression   . History of breast cancer 12/27/2017  . Hypertension   . Leukemia ([place])    CLL  . PVD (peripheral vascular disease) ([place])   . Thyroid disease     Social History   Tobacco Use  . Smoking status: Former Smoker    Packs/day: 0.75    Types: Cigarettes    Start date: 09/19/1962    Quit date: 09/20/1979    Years since quitting: 40.0  . Smokeless tobacco: Never Used  Substance Use Topics  . Alcohol use: No    Alcohol/week: 0.0 standard drinks  . Drug use: No    Family History  Problem Relation Age of Onset  . Healthy Mother   . Healthy Father   . Healthy Sister        "normal stuff for her age"  . Pancreatic cancer Brother     Allergies  Allergen Reactions  . Bupropion Other (See  Comments)    Fidgety   . Clonidine Derivatives Other (See Comments)  . Codeine Nausea Only  . Diclofenac-Misoprostol Other (See Comments)    Fidgety   . Olanzapine Other (See Comments)    Fidgety   . Pregabalin Other (See Comments) and Rash  . Valdecoxib Other (See Comments)  . Zonisamide Other (See Comments)    Fidgety     Physical Exam: Constitutional: in no apparent distress  Vitals:   09/25/19 0415 09/25/19 0605  BP: (!) 86/43 107/61  Pulse: (!) 57 67  Resp:    Temp: 97.6 F (36.4 C)   SpO2: 96% 100%   EYES: anicteric ENMT: no thrush Cardiovascular: Cor RRR Respiratory: clear; GI: Bowel sounds are normal, liver is not enlarged, spleen is not enlarged Musculoskeletal: no pedal edema noted Skin: negatives: no rash Neuro: right leg weakness  Lab Results  Component Value Date   WBC 84.3 (HH) 09/24/2019   HGB 10.7 (L) 09/24/2019   HCT 35.6 (L) 09/24/2019   MCV 87.7 09/24/2019   PLT 197 09/24/2019    Lab Results  Component Value Date   CREATININE 1.02 (H) 09/21/2019   BUN 18 09/21/2019   NA 139 09/21/2019   K 4.3 09/21/2019   CL 106 09/21/2019   CO2 24 09/21/2019    Lab Results  Component Value Date   ALT 16 09/10/2019   AST 17 09/10/2019   ALKPHOS 66 09/10/2019     Microbiology: No results found for this or any previous visit (from the past 240 hour(s)).  Thayer Headings, MD [facility] [email or web address] 09/25/2019, 1:40 PM

## 2019-09-25 NOTE — Progress Notes (Signed)
[name]ICINE [facility] REHABILITATION PROGRESS NOTE  Subjective/Complaints: Patient seen sitting up in bed this morning.  She states she slept fairly overnight because she received her medications late.  She states that she has not spoken to Neuro/Onc, but did speak to neurology yesterday who reviewed imaging results with her.  ROS: Denies CP, SOB, N/V/D  Objective: Vital Signs: Blood pressure 107/61, pulse 67, temperature 97.6 F (36.4 C), resp. rate 16, weight 78.9 kg, SpO2 100 %. MR BRAIN W WO CONTRAST  Addendum Date: 09/24/2019   ADDENDUM REPORT: 09/24/2019 11:52 ADDENDUM: Additional clinical information provided by Dr. 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 PML therefore favored. Electronically Signed   By: Macy Mis M.D.   On: 09/24/2019 11:52   Result Date: 09/24/2019 CLINICAL DATA:  Parietal tumor, follow-up EXAM: MRI HEAD WITHOUT AND WITH CONTRAST TECHNIQUE: Multiplanar, multiecho pulse sequences of the brain and surrounding structures were obtained without and with intravenous contrast. CONTRAST:  22mL GADAVIST GADOBUTROL 1 MMOL/ML IV SOLN COMPARISON:  09/04/2018 FINDINGS: Brain: Abnormal T2 hyperintensity is again identified primarily within the left parietal lobe with some extension into the posterior frontal lobe. Extent has increased. There is notable involvement of the postcentral gyrus and paracentral lobule as well as the medial postcentral gyrus. This extends to the superior margin of the posterior body of the left lateral ventricle. Additionally, there is slightly discontiguous T2 hyperintensity within the left precentral gyrus hand motor region and along the periventricular white matter (best seen on diffusion series 5, images 76 and 90). Associated mass effect is mild. There is no abnormal enhancement. There  is a small area of diffusion hyperintensity in the contralateral paracentral right frontoparietal region (series 5, image 86). This is new but not clearly visualized on other sequences. There is no evidence of hemorrhage. No hydrocephalus or extra-axial fluid collection. Vascular: Major vessel flow voids at the skull base are preserved. Skull and upper cervical spine: Normal marrow signal is preserved. Sinuses/Orbits: Paranasal sinuses are aerated. Orbits are unremarkable. Other: Sella is unremarkable.  Mastoid air cells are clear. IMPRESSION: Increase in extent of nonenhancing abnormal signal centered within the left parietal lobe as detailed above. There are new foci of discontiguous abnormal signal for which attention on follow-up is recommended, noting that both infiltrating tumor and ischemia are in the differential. 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 Close follow-up is recommended given change over short interval. Electronically Signed: By: Macy Mis M.D. On: 09/23/2019 15:41   Recent Labs    09/24/19 0548  WBC 84.3*  HGB 10.7*  HCT 35.6*  PLT 197   No results for input(s): NA, K, CL, CO2, GLUCOSE, BUN, CREATININE, CALCIUM in the last 72 hours.  Physical Exam: BP 107/61   Pulse 67   Temp 97.6 F (36.4 C)   Resp 16   Wt 78.9 kg   SpO2 100%   BMI 22.95 kg/m  Constitutional: No distress . Vital signs reviewed. HENT: Normocephalic.  Atraumatic. Eyes: EOMI. No discharge. Cardiovascular: No JVD. Respiratory: Normal effort.  No stridor. GI: Non-distended. Skin: Warm and dry.  Intact. Psych: Normal mood.  Normal behavior. Musc: No edema in extremities.  No tenderness in extremities. Neuro: Alert Motor:  RUE: Shoulder abduction, elbow flexion/extension 2/5, handgrip 3 -/5 Left upper extremity: 5/5 proximal distal, unchanged RLE: HF, KE 1/5, ADF 0/5, unchanged Left lower  extremity: Hip flexion, knee extension 4+/5, ankle dorsiflexion  5/5  Assessment/Plan: 1. 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  Physiatrist is providing close team supervision and 24 hour management of active medical problems listed below.  Physiatrist and rehab team continue to assess barriers to discharge/monitor patient progress toward functional and medical goals  Care Tool:  Bathing  Bathing activity did not occur: Refused Body parts bathed by patient: Right arm, Chest, Abdomen, Right upper leg, Left upper leg, Front perineal area, Face, Buttocks   Body parts bathed by helper: Right lower leg, Left lower leg, Left arm Body parts n/a: Front perineal area, Buttocks   Bathing assist Assist Level: Moderate Assistance - Patient 50 - 74%     Upper Body Dressing/Undressing Upper body dressing   What is the patient wearing?: Pull over shirt    Upper body assist Assist Level: Minimal Assistance - Patient > 75%    Lower Body Dressing/Undressing Lower body dressing      What is the patient wearing?: Underwear/pull up, Pants     Lower body assist Assist for lower body dressing: Minimal Assistance - Patient > 75%(bed level supine with bridges)     Toileting Toileting    Toileting assist Assist for toileting: Moderate Assistance - Patient 50 - 74%     Transfers Chair/bed transfer  Transfers assist     Chair/bed transfer assist level: Moderate Assistance - Patient 50 - 74%     Locomotion Ambulation   Ambulation assist      Assist level: 2 helpers Assistive device: Parallel bars Max distance: 3 ft   Walk 10 feet activity   Assist     Assist level: Total Assistance - Patient < 25% Assistive device: Lite Gait   Walk 50 feet activity   Assist Walk 50 feet with 2 turns activity did not occur: Safety/medical concerns  Assist level: Total Assistance - Patient < 25% Assistive device: Lite Gait    Walk 150 feet  activity   Assist Walk 150 feet activity did not occur: Safety/medical concerns         Walk 10 feet on uneven surface  activity   Assist Walk 10 feet on uneven surfaces activity did not occur: Safety/medical concerns         Wheelchair     Assist Will patient use wheelchair at discharge?: Yes Type of Wheelchair: Manual    Wheelchair assist level: Minimal Assistance - Patient > 75% Max wheelchair distance: 162ft    Wheelchair 50 feet with 2 turns activity    Assist        Assist Level: Minimal Assistance - Patient > 75%   Wheelchair 150 feet activity     Assist Wheelchair 150 feet activity did not occur: Safety/medical concerns   Assist Level: Minimal Assistance - Patient > 75%      Medical Problem List and Plan:  1. Impaired ADLs/mobility and function secondary to PML with R hemiparesis and spasticity   Continue CIR  Called daughter again today and attempted to answer questions regarding future plans.  Palliative care to have discussion with patient and family today-also discussed with daughter comfort care.  Neurology saw patient yesterday-appreciate recs  Discussed with Neuro/Onc findings of MRI, Neuro/Onc to discuss with hematology  Discussed with team functional goals as well as modification of discharge date given new findings on MRI/diagnosis with poor prognosis 2. Antithrombotics:   -DVT/anticoagulation: Pharmaceutical: Lovenox   -  antiplatelet therapy: n/a  3. Chronic pudendal pain/Pain Management: Cymbalta  Continue methadone bid, afternoon dose added, changed dosing to 10 mg 3 times daily  Monitor with increased mobility  ?Rash allergy to Lyrica-tolerating retrial, increased on 1/15  Baclofen 5 TID started on 1/8, increased on 1/13  Added anusol supp HCT BID  Cont Tucks pad to anal area QID  Continue ice  Relatively controlled with medications on 1/22  Will consider further medication adjustments if necessary 4. Mood: LCSW to  follow for evaluation and support.   Klonopin 0.5 twice daily for anxiety   Appears relatively controlled on 1/22  -antipsychotic agents: N/A  5. Neuropsych: This patient is ? fully capable of making decisions on her own behalf.  6. Skin/Wound Care: Routine pressure relief measures.  7. 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 Improved.   Creatinine 1.02 on 1/18  Encourage fluids  Cont to monitor 9. Hypothyroid: Resume supplement.  10. H/o depression: On Lutuda, cymbalta and Clonazepam  11.  PML: Follow up MRI brain ordered, please see above.  Decadron being slowly weaned per neurology 12. Steroid induced hyperglycemia: Hgb A1c-5.4.   Slightly labile on 1/22, will DC CBG checks  Monitor with increased mobility 13. OIC/?IBS: On multiple medications:  Laculose changed to PRN per patient preference  Senna decreased on 1/13  Linzess restarted per pt/family preference   Stable with meds on 1/22 14. Breast cancer in remission/CLL being observed:   WBCs 84.3 on 1/21.   Discussed with Neuro/Onc and Heme/Onc.  Per Heme/Onc cont to monitor and no cause for concern unless acute spike.  Afebrile  Cont to monitor 14. Iron deficiently anemia: Resolved  Added iron supplement   Hb 10.7 on 1/21  Cont to monitor 15. Spasticity of R ankle  See #3  PRAFO  ?  Improving 16.  Neurogenic bladder  Continue ditropan qhs 17.  Right scapular pain  X-ray reviewed, unremarkable for fracture  Voltaren gel ordered on 1/13  Improved 18.  Hypotension  Asymptomatic at present  Stable on 1/21    LOS: 16 days A FACE TO FACE EVALUATION WAS PERFORMED  Renee [name] Lorie Phenix 09/25/2019, 9:41 AM

## 2019-09-25 NOTE — Consult Note (Signed)
Consultation Note Date: 09/25/2019   Patient Name: Renee [name]  DOB: 1946-05-09  MRN: [id number]  Age / Sex: 74 y.o., female  PCP: [name] Referring Physician: Jamse Arn, MD  Reason for Consultation: Establishing goals of care  HPI/Patient Profile:  Renee [name] is a 74 year old female with history of CKD, HTN, CLL, breast cancer s/p lumpectomy with XRT, pudendal neuralgia, anxiety disorder who was admitted on 09/04/19 with 2 to 3-week history of multiple falls with progressive weakness affecting [place] of right hand and right foot drop.  MRI brain done showing 2 cm cystic lesion with vasogenic edema and question of tumor versus cerebritis.  She was transferred to [name] for further work-up and evaluated by neurology.  She was started on Decadron and MRI brain with contrast showed 2 cm subcortical white matter lesion in parietal lobe without enhancement--cerebritis or subacute infarct felt to be unlikely.  LP done showing elevated glucose with 1 WBC and 89 RBC.  CSF sent for flow cytometry and JC virus.  Dr. Malen [name] recommends biopsy with PET scan and Dr. Mickeal [name] consulted for input and felt that CNS involvement and CLL was very uncommon.  He question nonneoplastic etiology such as vascular inflammatory process and recommend repeating contrast enhanced MRI in 1 month as well as decreasing Decadron to 4 mg daily at discharge.  Patient to be discussed at tumor board on 1/11.  Aggressive rehab recommended due to ongoing right-sided weakness affecting mobility and ADLs.  CIR recommended due to functional decline.  Clinical Assessment and Goals of Care: Chart reviewed. Met with Renee R[place]e [name] at bedside.Introduced myself to patient as a Palliative care provider. Explained what Palliative care is and how we can help her during this time. I asked her what she understood about her recent hospital stay. She shared with me  that she was told yesterday that she is dying. I asked her to explain to me why she feels she is dying and she stated that a doctor told her that she has very limited time left.  She was feeling down per our conversation stating that she now feels she needs to be a DNR. I asked her if there is anyone else she would like to be present for conversations of code status and she said that she would like her daughter Renee [name] to be present. Called Renee [name] who was extremely emotional at this time.   Asked patient to share more about herself so that we could get a better impression of her as a woman. She said that she is from [name], she lived in [name] for 56 four years of her life. She is a widow who met her husband at the age of 48. He passed away eleven years ago of a brain tumor. She said that they had hospice during that time which was immensely helpful. She has two children A daughter, Renee [name] and a son, Renee [name]. She lives presently with her son Renee [name] who is a substitue postal carrier, he is on call often. Prior to this  hospitalization she was able to perform all bADLs.   In regard to active symptoms. Renee [name] has endorsed that she is weak on her left side and had continued pain in her bottom. The pain in her retum more specifically has been present for about twelve years and she receives treatment for this at [name].   Met with Renee [name] and her daughter, Renee [name] at bedside in afternoon. Hematology and Infectious disease were checking in at similar times. We were able to listen collectively to what each team had to say. 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  It appears that the underlying condition that has led to this condition is not clear, both teams have added some additional serology studies for further analysis. Hematology is reaching out to their associates at [facility] to verify that there are no other options. The idea of trying IgG was  presented. Patient and her daughter are willful to try anything that may make a difference in halting the disease process. 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 If she lacks in improvements it would be appropriate to think of hospice primarily.   We talked extensively about Palliative care and hospice as options moving forward. The patient wants to try to get stronger therefore we focused on discussing her going home with [facility] and Palliative follow up. We discussed that if she starts to identify diease progression then a transition could be made to hospice easily.    Some troubling symptoms to [name] at the present time are her constipation and dry mouth. We also discussed her right shoulder and left hip pain which are remedied well with lidocaine patches.   We discussed code status at length. Renee [name] would not wish to have CPR/Intubation/or feeding tube placement.   Discussed with patient the importance of continued conversation with family and their medical providers regarding overall plan of care and treatment options, ensuring decisions are within the context of the patients values and GOCs.  We will continue to follow along   Decision Maker: Patient is able to speak for herself, but has requested that her daughter, Renee ([place]) is involved in every discussion regarding her care.   SUMMARY OF RECOMMENDATIONS   OP palliative follow up with easy transition to hospice if patient determines that is what she'd like. Goals right now are to get stronger  DNAR/DNI/No feeding tubes  Code Status/Advance Care Planning:  DNR  Symptom Management:  Goals of Care:                 - Patient is DNAR/DNI  - A TOC referral has been placed for Palliative OP  Right Shoulder Pain: Left Hip Pain:  - Tylenol '650mg'$  PO TID  - Lidocaine Patch Qday, on 12 hours/off 12 hours  - Voltaren Gel  Chronic pudendal paint:  - Lyrica '50mg'$  PO BID  - Methadone '10mg'$  PO TID  - Baclofen '5mg'$  PO  TID  - Anusol suppository BID  - TUCKS QID                   Constipation:  - Linzess QDay                 - Lactulose increase to TID  - Bisacodyl suppository '10mg'$  PR QDay    Muscular Weakness:  - OOB during the day                 -  PT                 - OT   Xerostomia:                 - Good oral care QShift                 - Encourage liquid intake  Anxiety:  - Klonopin 0.'5mg'$  PO BID  Delirium Precautions:                 - Get up during the day                 - Encourage a familiar face to remain present throughout the day                 - Keep blinds open and lights on during daylight hours                 - Minimize the use of opioids/benzodiazepines  Emotional Support:  - Therapeutic Listening   Spiritual:                 - Chaplain consult   Palliative Prophylaxis:   Aspiration, Bowel Regimen, Delirium Protocol, Eye Care, Frequent Pain Assessment, Oral Care and Turn Reposition  Additional Recommendations (Limitations, Scope, Preferences):  Minimize Medications  Psycho-social/Spiritual:   Desire for further Chaplaincy support: Yes  Additional Recommendations: Caregiving  Support/Resources  Prognosis:   < 6 months  Discharge Planning: Home with Home Health     Primary Diagnoses: Present on Admission: **None**  I have reviewed the medical record, interviewed the patient and family, and examined the patient. The following aspects are pertinent.  Past Medical History:  Diagnosis Date  . Chronic renal disease, stage 3, moderately decreased glomerular filtration rate between 30-59 mL/min/1.73 square meter 12/27/2017  . Constipation   . Depression   . History of breast cancer 12/27/2017  . Hypertension   . Leukemia ([place])    CLL  . PVD (peripheral vascular disease) ([place])   . Thyroid disease    Social History   Socioeconomic History  . Marital status: Widowed    Spouse name: Not on file  . Number of children: Not on file  . Years of  education: Not on file  . Highest education level: Not on file  Occupational History  . Not on file  Tobacco Use  . Smoking status: Former Smoker    Packs/day: 0.75    Types: Cigarettes    Start date: 09/19/1962    Quit date: 09/20/1979    Years since quitting: 40.0  . Smokeless tobacco: Never Used  Substance and Sexual Activity  . Alcohol use: No    Alcohol/week: 0.0 standard drinks  . Drug use: No  . Sexual activity: Never  Other Topics Concern  . Not on file  Social History Narrative  . Not on file   Social Determinants of Health   Financial Resource Strain:   . Difficulty of Paying Living Expenses: Not on file  Food Insecurity:   . Worried About Charity fundraiser in the Last Year: Not on file  . Ran Out of Food in the Last Year: Not on file  Transportation Needs:   . Lack of Transportation (Medical): Not on file  . Lack of Transportation (Non-Medical): Not on file  Physical Activity:   . Days of Exercise per Week: Not on file  . Minutes of Exercise per Session: Not on file  Stress:   . Feeling of Stress : Not on file  Social Connections:   . Frequency of Communication with Friends and Family: Not on file  . Frequency of Social Gatherings with Friends and Family: Not on file  . Attends Religious Services: Not on file  . Active Member of Clubs or Organizations: Not on file  . Attends Archivist Meetings: Not on file  . Marital Status: Not on file   Family History  Problem Relation Age of Onset  . Healthy Mother   . Healthy Father   . Healthy Sister        "normal stuff for her age"  . Pancreatic cancer Brother    Scheduled Meds: . baclofen  10 mg Oral TID  . clonazePAM  0.5 mg Oral BID  . [START ON 10/04/2019] dexamethasone  1 mg Oral Daily  . [START ON 09/29/2019] dexamethasone  2 mg Oral Daily  . dexamethasone  4 mg Oral Daily  . diclofenac Sodium  2 g Topical QID  . enoxaparin (LOVENOX) injection  40 mg Subcutaneous Q24H  . feeding supplement  (ENSURE ENLIVE)  237 mL Oral TID WC  . hydrocortisone  25 mg Rectal BID  . insulin aspart  0-5 Units Subcutaneous QHS  . insulin aspart  0-6 Units Subcutaneous TID WC  . lactulose  10 g Oral BID  . levothyroxine  100 mcg Oral QAC breakfast  . lidocaine  2 patch Transdermal Q24H  . linaclotide  145 mcg Oral QAC breakfast  . lurasidone  40 mg Oral QHS  . Melatonin  3 mg Oral QHS  . methadone  10 mg Oral Daily   And  . methadone  10 mg Oral q1800  . methadone  10 mg Oral Q1200  . oxybutynin  5 mg Oral QHS  . pantoprazole  40 mg Oral BID  . pregabalin  50 mg Oral BID  . rosuvastatin  20 mg Oral q1800  . senna-docusate  1 tablet Oral QHS  . witch hazel-glycerin   Topical TID AC & HS   Continuous Infusions: PRN Meds:.acetaminophen, alum & mag hydroxide-simeth, bisacodyl, diphenhydrAMINE, guaiFENesin-dextromethorphan, polyethylene glycol, prochlorperazine **OR** prochlorperazine **OR** prochlorperazine, sodium phosphate Medications Prior to Admission:  Prior to Admission medications   Medication Sig Start Date End Date Taking? Authorizing Provider  acetaminophen (TYLENOL) 325 MG tablet Take 1-2 tablets (325-650 mg total) by mouth every 4 (four) hours as needed for mild pain. 09/10/19   Love, Ivan Anchors, PA-C  Calcium Carbonate-Vitamin D (CALCIUM HIGH POTENCY/VITAMIN D) 600-200 MG-UNIT TABS Take by mouth daily.     [provider]  clonazePAM (KLONOPIN) 0.5 MG tablet Take 0.5 mg by mouth 2 (two) times daily.     [provider]  dexamethasone (DECADRON) 4 MG tablet Take 1 tablet (4 mg total) by mouth daily. 09/08/19   Pokhrel, Corrie Mckusick, MD  DULoxetine (CYMBALTA) 60 MG capsule Take 60 mg by mouth daily.    [provider]  ENULOSE 10 GM/15ML SOLN TAKE 15 MLS (10 G TOTAL) BY MOUTH TWO (2) (TWO) TIMES DAILY AS NEEDED FOR MILD CONSTIPATION OR SEVERE CONSTIPATION.  08/04/19   Roger Shelter, FNP  KRILL OIL PO Take 900 mg by mouth daily.    [provider]  lactulose  (CHRONULAC) 10 GM/15ML solution Take 15 mLs (10 g total) by mouth 4 (four) times daily as needed for mild constipation or severe constipation. 08/26/19   Roger Shelter, FNP  levothyroxine (SYNTHROID, LEVOTHROID) 100 MCG tablet  Take 100 mcg by mouth daily before breakfast.    [provider]  linaclotide (LINZESS) 290 MCG CAPS capsule Take 1 capsule (290 mcg total) by mouth daily before breakfast. 08/20/19   Noralyn Pick, NP  Rolan Lipa 72 MCG capsule  08/01/19   [provider]  lurasidone (LATUDA) 40 MG TABS tablet Take 40 mg by mouth daily with breakfast.    [provider]  methadone (DOLOPHINE) 10 MG tablet 1.5 tabs ( 15 mg ) bid and 1 tab at HS, 4 per day 03/24/19   [provider]  pantoprazole (PROTONIX) 40 MG tablet Take 1 tablet (40 mg total) by mouth 2 (two) times daily. 09/08/19   Pokhrel, Corrie Mckusick, MD  polyethylene glycol (MIRALAX / GLYCOLAX) 17 g packet Take 17 g by mouth 2 (two) times daily. 09/10/19   Love, Ivan Anchors, PA-C  rosuvastatin (CRESTOR) 20 MG tablet Take 20 mg by mouth daily.    [provider]  senna-docusate (SENOKOT-S) 8.6-50 MG tablet Take 1 tablet by mouth 2 (two) times daily. 05/13/19   Roger Shelter, FNP  lactulose (CHRONULAC) 10 GM/15ML solution Take 15 mLs (10 g total) by mouth 2 (two) times daily as needed for mild constipation or severe constipation. 05/26/19   Roger Shelter, FNP   Allergies  Allergen Reactions  . Bupropion Other (See Comments)    Fidgety   . Clonidine Derivatives Other (See Comments)  . Codeine Nausea Only  . Diclofenac-Misoprostol Other (See Comments)    Fidgety   . Olanzapine Other (See Comments)    Fidgety   . Pregabalin Other (See Comments) and Rash  . Valdecoxib Other (See Comments)  . Zonisamide Other (See Comments)    Fidgety    Review of Systems  Constitutional: Positive for activity change.  Gastrointestinal: Positive for rectal pain.  Neurological: Positive for weakness.        Right side  Psychiatric/Behavioral: The patient is nervous/anxious.     Physical Exam Vitals and nursing note reviewed.  HENT:     Head: Normocephalic.     Nose: Nose normal.     Mouth/Throat:     Mouth: Mucous membranes are moist.  Eyes:     Pupils: Pupils are equal, round, and reactive to light.  Cardiovascular:     Rate and Rhythm: Normal rate.  Pulmonary:     Effort: Pulmonary effort is normal.  Abdominal:     General: Abdomen is flat.  Musculoskeletal:        General: Normal range of motion.     Cervical back: Normal range of motion.  Skin:    General: Skin is warm.  Neurological:     General: No focal deficit present.     Mental Status: She is alert.    Vital Signs: BP 107/61   Pulse 67   Temp 97.6 F (36.4 C)   Resp 16   Wt 78.9 kg   SpO2 100%   BMI 22.95 kg/m  Pain Scale: 0-10   Pain Score: 0-No pain  SpO2: SpO2: 100 % O2 Device:SpO2: 100 % O2 Flow Rate: .   IO: Intake/output summary:   Intake/Output Summary (Last 24 hours) at 09/25/2019 0740 Last data filed at 09/24/2019 1823 Gross per 24 hour  Intake 680 ml  Output --  Net 680 ml    LBM: Last BM Date: 09/24/19 Baseline Weight: Weight: 82.6 kg Most recent weight: Weight: 78.9 kg     Palliative Assessment/Data: 50%   Time In: 0645  Time Out: 0800 Time Total: 75 Greater than 50%  of this time was spent counseling and coordinating care related to the above assessment and plan.  Signed by: Rosezella Rumpf, NP   Please contact Palliative Medicine Team phone at [phone] for questions and concerns.  For individual provider: See Shea Evans

## 2019-09-25 NOTE — Progress Notes (Signed)
Recreational Therapy Session Note  Patient Details  Name: Matiana Martincic MRN: [id number] Date of Birth: 04-24-1946 Today's Date: 09/25/2019  Pain: no c/o Skilled Therapeutic Interventions/Progress Updates:  Pt in bed upon arrival sharing that she will now be followed by palliative care due to mri results.  Pt states that she tries not to think about it too much, attempts to distract herself with TV.  Pt shared that she felt like she was receiving conflicting information from neurology and the palliative care team.  (Since this session, this has been addressed)  Discussed life satisfiers including her love of reading and spending time/talking with family.  Provided printout of adaptive book holder to pt per her request and discussed other options for reading.  Also offered to assist pt with video chats but she declined stating as long as she could talk to them on the phone she was ok.  Emotional support provided through session.  Therapy/Group: Individual Therapy  Makana Rostad 09/25/2019, 1:10 PM

## 2019-09-25 NOTE — Consult Note (Addendum)
[place]  [phone] [phone]   [place]  Referral MD: Dr. 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  The patient was initially admitted to the hospital due to progressive right-sided weakness with ataxia.  CT of the head initially showed subacute ischemia in the left parietal lobe.  MRI/MRA of the brain on 09/03/2019, showed a 2 cm cystic lesion in the left parietal deep white matter with regional vasogenic edema concerning for tumor versus cerebritis.  Due to the patient's functional decline, she was admitted to the inpatient rehab unit.  Due to concern for further decline in strength, the patient had an MRI of the brain with and without contrast performed on 1/20//21.  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  During her hospitalization on 09/06/2019, a JC virus PCR, CSF was obtained which was positive.  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  The patient is currently being followed at [facility] for her CLL.  She has never required treatment.  Last visit while on 07/09/2019 and continued observation was recommended.  When seen today, the patient's daughter and palliative care NP were in the room. ID MD also arrived while I was visiting. She reports fatigue.  She has had night sweats for a long time.  She has persistent lymphadenopathy which is unchanged.  She denies  headaches and vision changes.  No recent fevers or chills.  Denies chest discomfort, shortness of breath, cough.  Denies abdominal pain, nausea, vomiting.  She has longstanding constipation and rectal pain.  She is seen by pain clinic for her rectal pain.  She has progressive weakness on the right side of her body.  No longer able to move her right arm or leg.  Denies expressive aphasia.  However, she does note that she has some difficulty finding her words at times and reports mild, intermittent confusion.  We were asked to see the patient regarding her CLL and to determine treatment of her CLL would help treat her symptoms related to the JC virus and also to evaluate for hypogammaglobulinemia.   Past Medical History:  Diagnosis Date  . Chronic renal disease, stage 3, moderately decreased glomerular filtration rate between 30-59 mL/min/1.73 square meter 12/27/2017  . Constipation   . Depression   . History of breast cancer 12/27/2017  . Hypertension   . Leukemia ([place])    CLL  . PVD (peripheral vascular disease) ([place])   . Thyroid disease   :  Past Surgical History:  Procedure Laterality Date  . ABDOMINAL HYSTERECTOMY    . BREAST SURGERY     Left Breast Lumpectomy  . COLON SURGERY    . HERNIA REPAIR  2000   Hiatal hernia repair    :  Current Facility-Administered Medications  Medication Dose Route Frequency Provider Last Rate Last Admin  . acetaminophen (TYLENOL) tablet 325-650 mg  325-650 mg Oral Q4H PRN Bary Leriche, PA-C   650 mg at  09/21/19 1738  . alum & mag hydroxide-simeth (MAALOX/MYLANTA) 200-200-20 MG/5ML suspension 30 mL  30 mL Oral Q4H PRN Love, Pamela S, PA-C      . baclofen (LIORESAL) tablet 10 mg  10 mg Oral TID Jamse Arn, MD   10 mg at 09/25/19 0835  . bisacodyl (DULCOLAX) suppository 10 mg  10 mg Rectal Daily PRN Love, Pamela S, PA-C      . clonazePAM Bobbye [place]) tablet 0.5 mg  0.5 mg Oral BID Love, Pamela S, PA-C   0.5 mg at 09/25/19 0836  . [START ON  10/04/2019] dexamethasone (DECADRON) tablet 1 mg  1 mg Oral Daily Greta Doom, MD      . Derrill Memo ON 09/29/2019] dexamethasone (DECADRON) tablet 2 mg  2 mg Oral Daily Greta Doom, MD      . dexamethasone (DECADRON) tablet 4 mg  4 mg Oral Daily Greta Doom, MD   4 mg at 09/25/19 0836  . diclofenac Sodium (VOLTAREN) 1 % topical gel 2 g  2 g Topical QID Jamse Arn, MD   2 g at 09/25/19 1220  . diphenhydrAMINE (BENADRYL) 12.5 MG/5ML elixir 12.5-25 mg  12.5-25 mg Oral Q6H PRN Love, Pamela S, PA-C      . enoxaparin (LOVENOX) injection 40 mg  40 mg Subcutaneous Q24H Love, Pamela S, PA-C   40 mg at 09/25/19 1029  . feeding supplement (ENSURE ENLIVE) (ENSURE ENLIVE) liquid 237 mL  237 mL Oral TID WC Jamse Arn, MD   237 mL at 09/25/19 1215  . guaiFENesin-dextromethorphan (ROBITUSSIN DM) 100-10 MG/5ML syrup 5-10 mL  5-10 mL Oral Q6H PRN Love, Pamela S, PA-C      . hydrocortisone (ANUSOL-HC) suppository 25 mg  25 mg Rectal BID Charlett Blake, MD   25 mg at 09/25/19 0836  . lactulose (CHRONULAC) 10 GM/15ML solution 10 g  10 g Oral BID Bary Leriche, PA-C   10 g at 09/25/19 4268  . levothyroxine (SYNTHROID) tablet 100 mcg  100 mcg Oral QAC breakfast Bary Leriche, PA-C   100 mcg at 09/25/19 3419  . lidocaine (LIDODERM) 5 % 2 patch  2 patch Transdermal Q24H Bary Leriche, PA-C   2 patch at 09/25/19 0602  . linaclotide (LINZESS) capsule 145 mcg  145 mcg Oral QAC breakfast Izora Ribas, MD   145 mcg at 09/25/19 0609  . lurasidone (LATUDA) tablet 40 mg  40 mg Oral QHS Bary Leriche, PA-C   40 mg at 09/24/19 2323  . Melatonin TABS 3 mg  3 mg Oral QHS Love, Pamela S, PA-C   3 mg at 09/24/19 2323  . methadone (DOLOPHINE) tablet 10 mg  10 mg Oral Daily Jamse Arn, MD   10 mg at 09/25/19 0836   And  . methadone (DOLOPHINE) tablet 10 mg  10 mg Oral q1800 Jamse Arn, MD   10 mg at 09/24/19 1720  . methadone (DOLOPHINE) tablet 10 mg  10 mg Oral Q1200  Jamse Arn, MD   10 mg at 09/25/19 1214  . oxybutynin (DITROPAN) tablet 5 mg  5 mg Oral QHS Kirsteins, Luanna Salk, MD   5 mg at 09/24/19 2209  . pantoprazole (PROTONIX) EC tablet 40 mg  40 mg Oral BID Bary Leriche, PA-C   40 mg at 09/25/19 6222  . polyethylene glycol (MIRALAX / GLYCOLAX) packet 17 g  17 g Oral Daily PRN Love, Pamela S, PA-C   17 g at  09/15/19 0756  . pregabalin (LYRICA) capsule 50 mg  50 mg Oral BID Jamse Arn, MD   50 mg at 09/25/19 0835  . prochlorperazine (COMPAZINE) tablet 5-10 mg  5-10 mg Oral Q6H PRN Love, Pamela S, PA-C       Or  . prochlorperazine (COMPAZINE) injection 5-10 mg  5-10 mg Intramuscular Q6H PRN Love, Pamela S, PA-C       Or  . prochlorperazine (COMPAZINE) suppository 12.5 mg  12.5 mg Rectal Q6H PRN Love, Pamela S, PA-C      . rosuvastatin (CRESTOR) tablet 20 mg  20 mg Oral q1800 Love, Pamela S, PA-C   20 mg at 09/24/19 1720  . senna-docusate (Senokot-S) tablet 1 tablet  1 tablet Oral QHS Jamse Arn, MD   1 tablet at 09/24/19 2209  . sodium phosphate (FLEET) 7-19 GM/118ML enema 1 enema  1 enema Rectal Once PRN Love, Ivan Anchors, PA-C      . witch hazel-glycerin (TUCKS) pad   Topical TID AC & HS Kirsteins, Luanna Salk, MD   Given at 09/25/19 1220     Allergies  Allergen Reactions  . Bupropion Other (See Comments)    Fidgety   . Clonidine Derivatives Other (See Comments)  . Codeine Nausea Only  . Diclofenac-Misoprostol Other (See Comments)    Fidgety   . Olanzapine Other (See Comments)    Fidgety   . Pregabalin Other (See Comments) and Rash  . Valdecoxib Other (See Comments)  . Zonisamide Other (See Comments)    Fidgety   :  Family History  Problem Relation Age of Onset  . Healthy Mother   . Healthy Father   . Healthy Sister        "normal stuff for her age"  . Pancreatic cancer Brother   :  Social History   Socioeconomic History  . Marital status: Widowed    Spouse name: Not on file  . Number of children: Not on file   . Years of education: Not on file  . Highest education level: Not on file  Occupational History  . Not on file  Tobacco Use  . Smoking status: Former Smoker    Packs/day: 0.75    Types: Cigarettes    Start date: 09/19/1962    Quit date: 09/20/1979    Years since quitting: 40.0  . Smokeless tobacco: Never Used  Substance and Sexual Activity  . Alcohol use: No    Alcohol/week: 0.0 standard drinks  . Drug use: No  . Sexual activity: Never  Other Topics Concern  . Not on file  Social History Narrative  . Not on file   Social Determinants of Health   Financial Resource Strain:   . Difficulty of Paying Living Expenses: Not on file  Food Insecurity:   . Worried About Charity fundraiser in the Last Year: Not on file  . Ran Out of Food in the Last Year: Not on file  Transportation Needs:   . Lack of Transportation (Medical): Not on file  . Lack of Transportation (Non-Medical): Not on file  Physical Activity:   . Days of Exercise per Week: Not on file  . Minutes of Exercise per Session: Not on file  Stress:   . Feeling of Stress : Not on file  Social Connections:   . Frequency of Communication with Friends and Family: Not on file  . Frequency of Social Gatherings with Friends and Family: Not on file  . Attends Religious Services: Not on  file  . Active Member of Clubs or Organizations: Not on file  . Attends Archivist Meetings: Not on file  . Marital Status: Not on file  Intimate Partner Violence:   . Fear of Current or Ex-Partner: Not on file  . Emotionally Abused: Not on file  . Physically Abused: Not on file  . Sexually Abused: Not on file  :  Review of Systems: A comprehensive 14 point review of systems was negative except as noted in the HPI.  Exam: Patient Vitals for the past 24 hrs:  BP Temp Pulse SpO2  09/25/19 0605 107/61 - 67 100 %  09/25/19 0415 (!) 86/43 97.6 F (36.4 C) (!) 57 96 %  09/24/19 1950 (!) 99/53 98.1 F (36.7 C) 65 96 %     General: Comfortable, no distress Eyes:  no scleral icterus.   ENT:  There were no oropharyngeal lesions.   Neck was without thyromegaly.   Lymphatics: Shotty cervical and supraclavicular adenopathy Respiratory: lungs were clear bilaterally without wheezing or crackles.   Cardiovascular:  Regular rate and rhythm, S1/S2, without murmur, rub or gallop.  There was no pedal edema.   GI:  abdomen was soft, flat, nontender, nondistended, without organomegaly.   Musculoskeletal: Normal strength in the left upper and left lower extremity, very little movement in the right arm and right leg.    Skin exam was without echymosis, petichae.   Neuro exam was nonfocal. Patient was alert and oriented.  Attention was good.   Language was appropriate.  Mood was normal without depression.  Speech was not pressured.  Thought content was not tangential.     Lab Results  Component Value Date   WBC 84.3 (HH) 09/24/2019   HGB 10.7 (L) 09/24/2019   HCT 35.6 (L) 09/24/2019   PLT 197 09/24/2019   GLUCOSE 102 (H) 09/21/2019   CHOL 110 09/04/2019   TRIG 117 09/04/2019   HDL 35 (L) 09/04/2019   LDLCALC 52 09/04/2019   ALT 16 09/10/2019   AST 17 09/10/2019   NA 139 09/21/2019   K 4.3 09/21/2019   CL 106 09/21/2019   CREATININE 1.02 (H) 09/21/2019   BUN 18 09/21/2019   CO2 24 09/21/2019    EEG  Result Date: 09/05/2019 Alexis Goodell, MD     09/05/2019 11:29 AM ELECTROENCEPHALOGRAM REPORT Patient: Renee [name]       Room #: 5K35W EEG No. ID: [id number] Age: 74 y.o.        Sex: female Referring Physician: Alfredia Ferguson Report Date:  09/05/2019       Interpreting Physician: Alexis Goodell History: Renee [name] is an 74 y.o. female with right sided weakness Medications: Klonopin, Decadron, Cymbalta, Insulin, Keppra, Synthroid, Linzess, Latuda, Methadone, Crestor Conditions of Recording:  This is a 21 channel routine scalp EEG performed with bipolar and monopolar montages arranged in accordance to the international 10/20  system of electrode placement. One channel was dedicated to EKG recording. The patient is in the awake state. Description:  The waking background activity consists of a low voltage, symmetrical, fairly well organized, 10 Hz alpha activity, seen from the parieto-occipital and posterior temporal regions.  Low voltage fast activity, poorly organized, is seen anteriorly and is at times superimposed on more posterior regions.  A mixture of theta and alpha rhythms are seen from the central and temporal regions.  Despite the frequently noted artifact over the left hemisphere, this activity is slower over the left parietal region where there are noted underlying slower rhythms  that slow the background in this region The patient does not drowse or sleep. Hyperventilation was not performed.   Intermittent photic stimulation was performed and elicits a symmetrical driving response but fails to elicit any abnormalities. IMPRESSION: This is an abnormal electroencephalogram secondary to slowing noted in the left parietal region consistent with the patient's radiological findings.  Alexis Goodell, MD Neurology [phone] 09/05/2019, 11:24 AM   DG Chest 2 View  Result Date: 09/03/2019 CLINICAL DATA:  Fall EXAM: CHEST - 2 VIEW COMPARISON:  CT 05/06/2019 FINDINGS: No acute airspace disease, pleural effusion or pneumothorax. Normal heart size. Aortic atherosclerosis. Nodular opacities at the bilateral hila, felt to correspond to adenopathy noted on previous CT. Moderate hiatal hernia. No pneumothorax. Degenerative changes of the spine. IMPRESSION: 1. No active cardiopulmonary disease. 2. Nodular opacities at the hila, felt to correspond to adenopathy noted on previous CT. 3. Moderate hiatal hernia. Electronically Signed   By: Donavan Foil M.D.   On: 09/03/2019 20:06   DG Scapula Right  Result Date: 09/15/2019 CLINICAL DATA:  Patient had multiple falls about 12 days ago onto right side, still having right shoulder pain and  stiffness. EXAM: RIGHT SCAPULA - 2+ VIEWS COMPARISON:  None. FINDINGS: Glenohumeral joint is intact. No evidence of scapular fracture or humeral fracture. The acromioclavicular joint is intact. IMPRESSION: No fracture or dislocation. Electronically Signed   By: Suzy Bouchard M.D.   On: 09/15/2019 11:15   CT Head Wo Contrast  Result Date: 09/03/2019 CLINICAL DATA:  Recent fall with right leg weakness, initial encounter EXAM: CT HEAD WITHOUT CONTRAST TECHNIQUE: Contiguous axial images were obtained from the base of the skull through the vertex without intravenous contrast. COMPARISON:  None. FINDINGS: Brain: Area of decreased attenuation is noted in the deep white matter of the left parietal lobe near the vertex consistent with subacute ischemia. No findings to suggest acute infarct are seen. No findings to suggest acute hemorrhage or space-occupying mass lesion are noted. Vascular: No hyperdense vessel or unexpected calcification. Skull: Normal. Negative for fracture or focal lesion. Sinuses/Orbits: No acute finding. Other: None. IMPRESSION: Area consistent with prior ischemia within the deep white matter of the left parietal lobe as described. No acute infarct is noted. Electronically Signed   By: Inez Catalina M.D.   On: 09/03/2019 14:50   MR ANGIO HEAD WO CONTRAST  Result Date: 09/03/2019 CLINICAL DATA:  [place] today with right leg weakness. EXAM: MRI HEAD WITHOUT CONTRAST MRA HEAD WITHOUT CONTRAST TECHNIQUE: Multiplanar, multiecho pulse sequences of the brain and surrounding structures were obtained without intravenous contrast. Angiographic images of the head were obtained using MRA technique without contrast. COMPARISON:  Head CT same day FINDINGS: MRI HEAD FINDINGS Brain: The brainstem and cerebellum are normal. Cerebral hemispheres show age related volume loss with some frontal predominance. Right cerebral hemisphere shows minimal small vessel change of the white matter. Left cerebral hemisphere  shows mild small vessel change of the white matter. At the left parietal vertex, within the deep white matter, there is a 2 cm in diameter cystic lesion with surrounding low level restricted diffusion and some adjacent vasogenic edema. This does not look like a typical ischemic infarction. No evidence of susceptibility artifact to suggest resolving hematoma. The differential diagnosis includes tumor and cerebritis, both infectious inflammatory and autoimmune. This includes progressive multifocal leukoencephalopathy. I would suggest postcontrast imaging to evaluate this more completely. Vascular: Major vessels at the base of the brain show flow. Skull and upper cervical spine: Negative Sinuses/Orbits: Clear/normal Other:  None MRA HEAD FINDINGS Both internal carotid arteries are widely patent into the brain. No siphon stenosis. The anterior and middle cerebral vessels are patent without proximal stenosis, aneurysm or vascular malformation. Both vertebral arteries are widely patent to the basilar. No basilar stenosis. Posterior circulation branch vessels appear normal. IMPRESSION: Normal MR angiography. Findings not suggestive of ischemic infarction. 2 cm cystic lesion in the left parietal deep white matter with regional vasogenic edema and a possible wall showing some restricted diffusion. Differential diagnosis is tumor versus cerebritis. Postcontrast imaging would be suggested for more complete evaluation. Electronically Signed   By: Nelson Chimes M.D.   On: 09/03/2019 20:10   MR BRAIN WO CONTRAST  Result Date: 09/03/2019 CLINICAL DATA:  [place] today with right leg weakness. EXAM: MRI HEAD WITHOUT CONTRAST MRA HEAD WITHOUT CONTRAST TECHNIQUE: Multiplanar, multiecho pulse sequences of the brain and surrounding structures were obtained without intravenous contrast. Angiographic images of the head were obtained using MRA technique without contrast. COMPARISON:  Head CT same day FINDINGS: MRI HEAD FINDINGS Brain: The  brainstem and cerebellum are normal. Cerebral hemispheres show age related volume loss with some frontal predominance. Right cerebral hemisphere shows minimal small vessel change of the white matter. Left cerebral hemisphere shows mild small vessel change of the white matter. At the left parietal vertex, within the deep white matter, there is a 2 cm in diameter cystic lesion with surrounding low level restricted diffusion and some adjacent vasogenic edema. This does not look like a typical ischemic infarction. No evidence of susceptibility artifact to suggest resolving hematoma. The differential diagnosis includes tumor and cerebritis, both infectious inflammatory and autoimmune. This includes progressive multifocal leukoencephalopathy. I would suggest postcontrast imaging to evaluate this more completely. Vascular: Major vessels at the base of the brain show flow. Skull and upper cervical spine: Negative Sinuses/Orbits: Clear/normal Other: None MRA HEAD FINDINGS Both internal carotid arteries are widely patent into the brain. No siphon stenosis. The anterior and middle cerebral vessels are patent without proximal stenosis, aneurysm or vascular malformation. Both vertebral arteries are widely patent to the basilar. No basilar stenosis. Posterior circulation branch vessels appear normal. IMPRESSION: Normal MR angiography. Findings not suggestive of ischemic infarction. 2 cm cystic lesion in the left parietal deep white matter with regional vasogenic edema and a possible wall showing some restricted diffusion. Differential diagnosis is tumor versus cerebritis. Postcontrast imaging would be suggested for more complete evaluation. Electronically Signed   By: Nelson Chimes M.D.   On: 09/03/2019 20:10   MR BRAIN W CONTRAST  Result Date: 09/05/2019 CLINICAL DATA:  Follow-up examination for signal abnormality at the left parietal lobe. EXAM: MRI HEAD WITH CONTRAST TECHNIQUE: Multiplanar, multiecho pulse sequences of the  brain and surrounding structures were obtained with intravenous contrast. CONTRAST:  48m GADAVIST GADOBUTROL 1 MMOL/ML IV SOLN COMPARISON:  Prior brain MRI from 09/03/2019. FINDINGS: Brain: Postcontrast imaging only was performed. Following contrast administration, the previously identified 2 cm cystic lesion involving the subcortical white matter of the mesial left parietal lobe does not demonstrate any significant intrinsic enhancement. No other abnormal enhancement seen elsewhere within the brain. Vascular: Normal intravascular enhancement seen throughout the major intracranial vasculature. Skull and upper cervical spine: Craniocervical junction within normal limits. Bone marrow signal intensity normal. No scalp soft tissue abnormality. Sinuses/Orbits: Globes and orbital soft tissues within normal limits. Paranasal sinuses are largely clear. No mastoid effusion. Other: None. IMPRESSION: 1. 2 cm cystic lesion involving the subcortical white matter of the left parietal lobe demonstrates no  intrinsic enhancement. 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 Cerebritis or subacute infarction felt to be unlikely as these with expected to demonstrate associated enhancement. Short interval follow-up MRI in 3 months to document stability is recommended. 2. No other abnormal enhancement elsewhere within the brain. Electronically Signed   By: Jeannine Boga M.D.   On: 09/05/2019 01:14   MR BRAIN W WO CONTRAST  Addendum Date: 09/24/2019   ADDENDUM REPORT: 09/24/2019 11:52 ADDENDUM: Additional clinical information provided by Dr. 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 PML therefore favored. Electronically Signed   By: Macy Mis M.D.   On: 09/24/2019 11:52   Result Date: 09/24/2019 CLINICAL DATA:  Parietal tumor, follow-up EXAM: MRI HEAD  WITHOUT AND WITH CONTRAST TECHNIQUE: Multiplanar, multiecho pulse sequences of the brain and surrounding structures were obtained without and with intravenous contrast. CONTRAST:  98m GADAVIST GADOBUTROL 1 MMOL/ML IV SOLN COMPARISON:  09/04/2018 FINDINGS: Brain: Abnormal T2 hyperintensity is again identified primarily within the left parietal lobe with some extension into the posterior frontal lobe. Extent has increased. There is notable involvement of the postcentral gyrus and paracentral lobule as well as the medial postcentral gyrus. This extends to the superior margin of the posterior body of the left lateral ventricle. Additionally, there is slightly discontiguous T2 hyperintensity within the left precentral gyrus hand motor region and along the periventricular white matter (best seen on diffusion series 5, images 76 and 90). Associated mass effect is mild. There is no abnormal enhancement. There is a small area of diffusion hyperintensity in the contralateral paracentral right frontoparietal region (series 5, image 86). This is new but not clearly visualized on other sequences. There is no evidence of hemorrhage. No hydrocephalus or extra-axial fluid collection. Vascular: Major vessel flow voids at the skull base are preserved. Skull and upper cervical spine: Normal marrow signal is preserved. Sinuses/Orbits: Paranasal sinuses are aerated. Orbits are unremarkable. Other: Sella is unremarkable.  Mastoid air cells are clear. IMPRESSION: Increase in extent of nonenhancing abnormal signal centered within the left parietal lobe as detailed above. There are new foci of discontiguous abnormal signal for which attention on follow-up is recommended, noting that both infiltrating tumor and ischemia are in the differential. 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 Close follow-up is recommended given change over short interval. Electronically Signed: By: PMacy MisM.D. On: 09/23/2019 15:41    ECHOCARDIOGRAM COMPLETE  Result Date: 09/04/2019   ECHOCARDIOGRAM REPORT   Patient Name:   Renee GELARDIDate of Exam: 09/04/2019 Medical Rec #:  [id number]   Height:       73.0 in Accession #:    [id number]  Weight:       184.0 lb Date of Birth:  121-Aug-1947   BSA:          2.08 m Patient Age:    78years     BP:           108/53 mmHg Patient Gender: F            HR:           67 bpm. [place]  [place]: 2D Echo Indications:    Stroke 434.91 / I163.9  History:        Patient has no prior history of Echocardiogram examinations.  Risk Factors:Former Smoker and Hypertension. History of breast                 cancer, Chronic Renal Disease, GERD.  Sonographer:    Leavy Cella RDCS (AE) Referring Phys: [place]  1. Left ventricular ejection fraction, by visual estimation, is 70 to 75%. The left ventricle has hyperdynamic function. There is no left ventricular hypertrophy.  2. Left ventricular diastolic parameters are indeterminate.  3. The left ventricle has no regional wall motion abnormalities.  4. Global right ventricle has normal systolic function.The right ventricular size is normal. No increase in right ventricular wall thickness.  5. Left atrial size was normal.  6. Right atrial size was normal.  7. Presence of pericardial fat pad.  8. The mitral valve is grossly normal. No evidence of mitral valve regurgitation.  9. The tricuspid valve is grossly normal. 10. The aortic valve is tricuspid. Aortic valve regurgitation is not visualized. 11. The pulmonic valve was grossly normal. Pulmonic valve regurgitation is trivial. 12. TR signal is inadequate for assessing pulmonary artery systolic pressure. 13. The inferior vena cava is normal in size with greater than 50% respiratory variability, suggesting right atrial pressure of 3 mmHg. FINDINGS  Left Ventricle: Left ventricular ejection fraction, by visual estimation, is 70 to 75%. The left ventricle has  hyperdynamic function. The left ventricle has no regional wall motion abnormalities. There is no left ventricular hypertrophy. Left ventricular diastolic parameters are indeterminate. Right Ventricle: The right ventricular size is normal. No increase in right ventricular wall thickness. Global RV systolic function is has normal systolic function. Left Atrium: Left atrial size was normal in size. Right Atrium: Right atrial size was normal in size Pericardium: There is no evidence of pericardial effusion. Presence of pericardial fat pad. Mitral Valve: The mitral valve is grossly normal. No evidence of mitral valve regurgitation. Tricuspid Valve: The tricuspid valve is grossly normal. Tricuspid valve regurgitation is trivial. Aortic Valve: The aortic valve is tricuspid. Aortic valve regurgitation is not visualized. Pulmonic Valve: The pulmonic valve was grossly normal. Pulmonic valve regurgitation is trivial. Pulmonic regurgitation is trivial. Aorta: The aortic root is normal in size and structure. Venous: The inferior vena cava is normal in size with greater than 50% respiratory variability, suggesting right atrial pressure of 3 mmHg. IAS/Shunts: No atrial level shunt detected by color flow Doppler.  LEFT VENTRICLE PLAX 2D LVIDd:         3.75 cm  Diastology LVIDs:         2.13 cm  LV e' lateral:   8.92 cm/s LV PW:         0.98 cm  LV E/e' lateral: 8.7 LV IVS:        0.96 cm  LV e' medial:    11.50 cm/s LVOT diam:     2.20 cm  LV E/e' medial:  6.8 LV SV:         45 ml LV SV Index:   21.71 LVOT Area:     3.80 cm  RIGHT VENTRICLE RV S prime:     12.40 cm/s TAPSE (M-mode): 2.2 cm LEFT ATRIUM             Index       RIGHT ATRIUM           Index LA diam:        2.60 cm 1.25 cm/m  RA Area:     11.20 cm LA Vol (A2C):   44.3 ml  21.33 ml/m RA Volume:   27.00 ml  13.00 ml/m LA Vol (A4C):   32.8 ml 15.79 ml/m LA Biplane Vol: 40.5 ml 19.50 ml/m   AORTA Ao Root diam: 2.80 cm MITRAL VALVE MV Area (PHT): 3.37 cm              SHUNTS MV PHT:        65.25 msec           Systemic Diam: 2.20 cm MV Decel Time: 225 msec MV E velocity: 77.70 cm/s 103 cm/s MV A velocity: 83.20 cm/s 70.3 cm/s MV E/A ratio:  0.93       1.5  Rozann Lesches MD Electronically signed by Rozann Lesches MD Signature Date/Time: 09/04/2019/1:53:34 PM    Final    DG FLUORO GUIDE LUMBAR PUNCTURE  Result Date: 09/06/2019 CLINICAL DATA:  Right leg weakness.  Cerebritis.  Brain mass EXAM: DIAGNOSTIC LUMBAR PUNCTURE UNDER FLUOROSCOPIC GUIDANCE FLUOROSCOPY TIME:  Radiation Exposure Index (as provided by the fluoroscopic device): 17.9 If the device does not provide the exposure index: Fluoroscopy Time (in minutes and seconds):  48 seconds Number of Acquired Images:  0 PROCEDURE: Informed consent was obtained from the patient prior to the procedure, including potential complications of headache, allergy, and pain. With the patient prone, the lower back was prepped with Betadine. 1% Lidocaine was used for local anesthesia. Lumbar puncture was performed at the L2-3 level using a 22 gauge needle with return of clear CSF with an opening pressure of 10 cm water. 13.5 ml of CSF were obtained for laboratory studies. The patient tolerated the procedure well and there were no apparent complications. IMPRESSION: Technically successful lumbar puncture with removal of 13.5 cc of clear CSF. Opening pressure was 10 cm of water. Electronically Signed   By: Kerby Moors M.D.   On: 09/06/2019 15:37     EEG  Result Date: 09/05/2019 Alexis Goodell, MD     09/05/2019 11:29 AM ELECTROENCEPHALOGRAM REPORT Patient: Renee [name]       Room #: 3A07M EEG No. ID: [id number] Age: 74 y.o.        Sex: female Referring Physician: Alfredia Ferguson Report Date:  09/05/2019       Interpreting Physician: Alexis Goodell History: Renee [name] is an 74 y.o. female with right sided weakness Medications: Klonopin, Decadron, Cymbalta, Insulin, Keppra, Synthroid, Linzess, Latuda, Methadone, Crestor Conditions of Recording:   This is a 21 channel routine scalp EEG performed with bipolar and monopolar montages arranged in accordance to the international 10/20 system of electrode placement. One channel was dedicated to EKG recording. The patient is in the awake state. Description:  The waking background activity consists of a low voltage, symmetrical, fairly well organized, 10 Hz alpha activity, seen from the parieto-occipital and posterior temporal regions.  Low voltage fast activity, poorly organized, is seen anteriorly and is at times superimposed on more posterior regions.  A mixture of theta and alpha rhythms are seen from the central and temporal regions.  Despite the frequently noted artifact over the left hemisphere, this activity is slower over the left parietal region where there are noted underlying slower rhythms that slow the background in this region The patient does not drowse or sleep. Hyperventilation was not performed.   Intermittent photic stimulation was performed and elicits a symmetrical driving response but fails to elicit any abnormalities. IMPRESSION: This is an abnormal electroencephalogram secondary to slowing noted in the left parietal region consistent with the patient's radiological findings.  Alexis Goodell, MD Neurology  [phone] 09/05/2019, 11:24 AM   DG Chest 2 View  Result Date: 09/03/2019 CLINICAL DATA:  Fall EXAM: CHEST - 2 VIEW COMPARISON:  CT 05/06/2019 FINDINGS: No acute airspace disease, pleural effusion or pneumothorax. Normal heart size. Aortic atherosclerosis. Nodular opacities at the bilateral hila, felt to correspond to adenopathy noted on previous CT. Moderate hiatal hernia. No pneumothorax. Degenerative changes of the spine. IMPRESSION: 1. No active cardiopulmonary disease. 2. Nodular opacities at the hila, felt to correspond to adenopathy noted on previous CT. 3. Moderate hiatal hernia. Electronically Signed   By: Donavan Foil M.D.   On: 09/03/2019 20:06   DG Scapula  Right  Result Date: 09/15/2019 CLINICAL DATA:  Patient had multiple falls about 12 days ago onto right side, still having right shoulder pain and stiffness. EXAM: RIGHT SCAPULA - 2+ VIEWS COMPARISON:  None. FINDINGS: Glenohumeral joint is intact. No evidence of scapular fracture or humeral fracture. The acromioclavicular joint is intact. IMPRESSION: No fracture or dislocation. Electronically Signed   By: Suzy Bouchard M.D.   On: 09/15/2019 11:15   CT Head Wo Contrast  Result Date: 09/03/2019 CLINICAL DATA:  Recent fall with right leg weakness, initial encounter EXAM: CT HEAD WITHOUT CONTRAST TECHNIQUE: Contiguous axial images were obtained from the base of the skull through the vertex without intravenous contrast. COMPARISON:  None. FINDINGS: Brain: Area of decreased attenuation is noted in the deep white matter of the left parietal lobe near the vertex consistent with subacute ischemia. No findings to suggest acute infarct are seen. No findings to suggest acute hemorrhage or space-occupying mass lesion are noted. Vascular: No hyperdense vessel or unexpected calcification. Skull: Normal. Negative for fracture or focal lesion. Sinuses/Orbits: No acute finding. Other: None. IMPRESSION: Area consistent with prior ischemia within the deep white matter of the left parietal lobe as described. No acute infarct is noted. Electronically Signed   By: Inez Catalina M.D.   On: 09/03/2019 14:50   MR ANGIO HEAD WO CONTRAST  Result Date: 09/03/2019 CLINICAL DATA:  [place] today with right leg weakness. EXAM: MRI HEAD WITHOUT CONTRAST MRA HEAD WITHOUT CONTRAST TECHNIQUE: Multiplanar, multiecho pulse sequences of the brain and surrounding structures were obtained without intravenous contrast. Angiographic images of the head were obtained using MRA technique without contrast. COMPARISON:  Head CT same day FINDINGS: MRI HEAD FINDINGS Brain: The brainstem and cerebellum are normal. Cerebral hemispheres show age related  volume loss with some frontal predominance. Right cerebral hemisphere shows minimal small vessel change of the white matter. Left cerebral hemisphere shows mild small vessel change of the white matter. At the left parietal vertex, within the deep white matter, there is a 2 cm in diameter cystic lesion with surrounding low level restricted diffusion and some adjacent vasogenic edema. This does not look like a typical ischemic infarction. No evidence of susceptibility artifact to suggest resolving hematoma. The differential diagnosis includes tumor and cerebritis, both infectious inflammatory and autoimmune. This includes progressive multifocal leukoencephalopathy. I would suggest postcontrast imaging to evaluate this more completely. Vascular: Major vessels at the base of the brain show flow. Skull and upper cervical spine: Negative Sinuses/Orbits: Clear/normal Other: None MRA HEAD FINDINGS Both internal carotid arteries are widely patent into the brain. No siphon stenosis. The anterior and middle cerebral vessels are patent without proximal stenosis, aneurysm or vascular malformation. Both vertebral arteries are widely patent to the basilar. No basilar stenosis. Posterior circulation branch vessels appear normal. IMPRESSION: Normal MR angiography. Findings not suggestive of ischemic infarction. 2 cm cystic  lesion in the left parietal deep white matter with regional vasogenic edema and a possible wall showing some restricted diffusion. Differential diagnosis is tumor versus cerebritis. Postcontrast imaging would be suggested for more complete evaluation. Electronically Signed   By: Nelson Chimes M.D.   On: 09/03/2019 20:10   MR BRAIN WO CONTRAST  Result Date: 09/03/2019 CLINICAL DATA:  [place] today with right leg weakness. EXAM: MRI HEAD WITHOUT CONTRAST MRA HEAD WITHOUT CONTRAST TECHNIQUE: Multiplanar, multiecho pulse sequences of the brain and surrounding structures were obtained without intravenous contrast.  Angiographic images of the head were obtained using MRA technique without contrast. COMPARISON:  Head CT same day FINDINGS: MRI HEAD FINDINGS Brain: The brainstem and cerebellum are normal. Cerebral hemispheres show age related volume loss with some frontal predominance. Right cerebral hemisphere shows minimal small vessel change of the white matter. Left cerebral hemisphere shows mild small vessel change of the white matter. At the left parietal vertex, within the deep white matter, there is a 2 cm in diameter cystic lesion with surrounding low level restricted diffusion and some adjacent vasogenic edema. This does not look like a typical ischemic infarction. No evidence of susceptibility artifact to suggest resolving hematoma. The differential diagnosis includes tumor and cerebritis, both infectious inflammatory and autoimmune. This includes progressive multifocal leukoencephalopathy. I would suggest postcontrast imaging to evaluate this more completely. Vascular: Major vessels at the base of the brain show flow. Skull and upper cervical spine: Negative Sinuses/Orbits: Clear/normal Other: None MRA HEAD FINDINGS Both internal carotid arteries are widely patent into the brain. No siphon stenosis. The anterior and middle cerebral vessels are patent without proximal stenosis, aneurysm or vascular malformation. Both vertebral arteries are widely patent to the basilar. No basilar stenosis. Posterior circulation branch vessels appear normal. IMPRESSION: Normal MR angiography. Findings not suggestive of ischemic infarction. 2 cm cystic lesion in the left parietal deep white matter with regional vasogenic edema and a possible wall showing some restricted diffusion. Differential diagnosis is tumor versus cerebritis. Postcontrast imaging would be suggested for more complete evaluation. Electronically Signed   By: Nelson Chimes M.D.   On: 09/03/2019 20:10   MR BRAIN W CONTRAST  Result Date: 09/05/2019 CLINICAL DATA:   Follow-up examination for signal abnormality at the left parietal lobe. EXAM: MRI HEAD WITH CONTRAST TECHNIQUE: Multiplanar, multiecho pulse sequences of the brain and surrounding structures were obtained with intravenous contrast. CONTRAST:  76m GADAVIST GADOBUTROL 1 MMOL/ML IV SOLN COMPARISON:  Prior brain MRI from 09/03/2019. FINDINGS: Brain: Postcontrast imaging only was performed. Following contrast administration, the previously identified 2 cm cystic lesion involving the subcortical white matter of the mesial left parietal lobe does not demonstrate any significant intrinsic enhancement. No other abnormal enhancement seen elsewhere within the brain. Vascular: Normal intravascular enhancement seen throughout the major intracranial vasculature. Skull and upper cervical spine: Craniocervical junction within normal limits. Bone marrow signal intensity normal. No scalp soft tissue abnormality. Sinuses/Orbits: Globes and orbital soft tissues within normal limits. Paranasal sinuses are largely clear. No mastoid effusion. Other: None. IMPRESSION: 1. 2 cm cystic lesion involving the subcortical white matter of the left parietal lobe demonstrates no intrinsic enhancement. 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 Cerebritis or subacute infarction felt to be unlikely as these with expected to demonstrate associated enhancement. Short interval follow-up MRI in 3 months to document stability is recommended. 2. No other abnormal enhancement elsewhere within the brain. Electronically Signed   By: BJeannine BogaM.D.   On: 09/05/2019 01:14  MR BRAIN W WO CONTRAST  Addendum Date: 09/24/2019   ADDENDUM REPORT: 09/24/2019 11:52 ADDENDUM: Additional clinical information provided by Dr. 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 PML therefore  favored. Electronically Signed   By: Macy Mis M.D.   On: 09/24/2019 11:52   Result Date: 09/24/2019 CLINICAL DATA:  Parietal tumor, follow-up EXAM: MRI HEAD WITHOUT AND WITH CONTRAST TECHNIQUE: Multiplanar, multiecho pulse sequences of the brain and surrounding structures were obtained without and with intravenous contrast. CONTRAST:  55m GADAVIST GADOBUTROL 1 MMOL/ML IV SOLN COMPARISON:  09/04/2018 FINDINGS: Brain: Abnormal T2 hyperintensity is again identified primarily within the left parietal lobe with some extension into the posterior frontal lobe. Extent has increased. There is notable involvement of the postcentral gyrus and paracentral lobule as well as the medial postcentral gyrus. This extends to the superior margin of the posterior body of the left lateral ventricle. Additionally, there is slightly discontiguous T2 hyperintensity within the left precentral gyrus hand motor region and along the periventricular white matter (best seen on diffusion series 5, images 76 and 90). Associated mass effect is mild. There is no abnormal enhancement. There is a small area of diffusion hyperintensity in the contralateral paracentral right frontoparietal region (series 5, image 86). This is new but not clearly visualized on other sequences. There is no evidence of hemorrhage. No hydrocephalus or extra-axial fluid collection. Vascular: Major vessel flow voids at the skull base are preserved. Skull and upper cervical spine: Normal marrow signal is preserved. Sinuses/Orbits: Paranasal sinuses are aerated. Orbits are unremarkable. Other: Sella is unremarkable.  Mastoid air cells are clear. IMPRESSION: Increase in extent of nonenhancing abnormal signal centered within the left parietal lobe as detailed above. There are new foci of discontiguous abnormal signal for which attention on follow-up is recommended, noting that both infiltrating tumor and ischemia are in the differential. 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 Close follow-up is recommended given change over short interval. Electronically Signed: By: PMacy MisM.D. On: 09/23/2019 15:41   ECHOCARDIOGRAM COMPLETE  Result Date: 09/04/2019   ECHOCARDIOGRAM REPORT   Patient Name:   Renee LAURICELLADate of Exam: 09/04/2019 Medical Rec #:  [id number]   Height:       73.0 in Accession #:    [id number]  Weight:       184.0 lb Date of Birth:  [birth year]   BSA:          2.08 m Patient Age:    89years     BP:           108/53 mmHg Patient Gender: F            HR:           67 bpm. [facility]  [facility] Echo Indications:    Stroke 434.91 / I163.9  History:        Patient has no prior history of Echocardiogram examinations.                 Risk Factors:Former Smoker and Hypertension. History of breast                 cancer, Chronic Renal Disease, GERD.  Sonographer:    JLeavy CellaRDCS (AE) Referring Phys: 5[place] 1. Left ventricular ejection fraction, by visual estimation, is  70 to 75%. The left ventricle has hyperdynamic function. There is no left ventricular hypertrophy.  2. Left ventricular diastolic parameters are indeterminate.  3. The left ventricle has no regional wall motion abnormalities.  4. Global right ventricle has normal systolic function.The right ventricular size is normal. No increase in right ventricular wall thickness.  5. Left atrial size was normal.  6. Right atrial size was normal.  7. Presence of pericardial fat pad.  8. The mitral valve is grossly normal. No evidence of mitral valve regurgitation.  9. The tricuspid valve is grossly normal. 10. The aortic valve is tricuspid. Aortic valve regurgitation is not visualized. 11. The pulmonic valve was grossly normal. Pulmonic valve regurgitation is trivial. 12. TR signal is inadequate for assessing pulmonary artery systolic pressure. 13. The inferior vena cava is normal in size with greater than 50% respiratory variability, suggesting  right atrial pressure of 3 mmHg. FINDINGS  Left Ventricle: Left ventricular ejection fraction, by visual estimation, is 70 to 75%. The left ventricle has hyperdynamic function. The left ventricle has no regional wall motion abnormalities. There is no left ventricular hypertrophy. Left ventricular diastolic parameters are indeterminate. Right Ventricle: The right ventricular size is normal. No increase in right ventricular wall thickness. Global RV systolic function is has normal systolic function. Left Atrium: Left atrial size was normal in size. Right Atrium: Right atrial size was normal in size Pericardium: There is no evidence of pericardial effusion. Presence of pericardial fat pad. Mitral Valve: The mitral valve is grossly normal. No evidence of mitral valve regurgitation. Tricuspid Valve: The tricuspid valve is grossly normal. Tricuspid valve regurgitation is trivial. Aortic Valve: The aortic valve is tricuspid. Aortic valve regurgitation is not visualized. Pulmonic Valve: The pulmonic valve was grossly normal. Pulmonic valve regurgitation is trivial. Pulmonic regurgitation is trivial. Aorta: The aortic root is normal in size and structure. Venous: The inferior vena cava is normal in size with greater than 50% respiratory variability, suggesting right atrial pressure of 3 mmHg. IAS/Shunts: No atrial level shunt detected by color flow Doppler.  LEFT VENTRICLE PLAX 2D LVIDd:         3.75 cm  Diastology LVIDs:         2.13 cm  LV e' lateral:   8.92 cm/s LV PW:         0.98 cm  LV E/e' lateral: 8.7 LV IVS:        0.96 cm  LV e' medial:    11.50 cm/s LVOT diam:     2.20 cm  LV E/e' medial:  6.8 LV SV:         45 ml LV SV Index:   21.71 LVOT Area:     3.80 cm  RIGHT VENTRICLE RV S prime:     12.40 cm/s TAPSE (M-mode): 2.2 cm LEFT ATRIUM             Index       RIGHT ATRIUM           Index LA diam:        2.60 cm 1.25 cm/m  RA Area:     11.20 cm LA Vol (A2C):   44.3 ml 21.33 ml/m RA Volume:   27.00 ml  13.00  ml/m LA Vol (A4C):   32.8 ml 15.79 ml/m LA Biplane Vol: 40.5 ml 19.50 ml/m   AORTA Ao Root diam: 2.80 cm MITRAL VALVE MV Area (PHT): 3.37 cm  SHUNTS MV PHT:        65.25 msec           Systemic Diam: 2.20 cm MV Decel Time: 225 msec MV E velocity: 77.70 cm/s 103 cm/s MV A velocity: 83.20 cm/s 70.3 cm/s MV E/A ratio:  0.93       1.5  Rozann Lesches MD Electronically signed by Rozann Lesches MD Signature Date/Time: 09/04/2019/1:53:34 PM    Final    DG FLUORO GUIDE LUMBAR PUNCTURE  Result Date: 09/06/2019 CLINICAL DATA:  Right leg weakness.  Cerebritis.  Brain mass EXAM: DIAGNOSTIC LUMBAR PUNCTURE UNDER FLUOROSCOPIC GUIDANCE FLUOROSCOPY TIME:  Radiation Exposure Index (as provided by the fluoroscopic device): 17.9 If the device does not provide the exposure index: Fluoroscopy Time (in minutes and seconds):  48 seconds Number of Acquired Images:  0 PROCEDURE: Informed consent was obtained from the patient prior to the procedure, including potential complications of headache, allergy, and pain. With the patient prone, the lower back was prepped with Betadine. 1% Lidocaine was used for local anesthesia. Lumbar puncture was performed at the L2-3 level using a 22 gauge needle with return of clear CSF with an opening pressure of 10 cm water. 13.5 ml of CSF were obtained for laboratory studies. The patient tolerated the procedure well and there were no apparent complications. IMPRESSION: Technically successful lumbar puncture with removal of 13.5 cc of clear CSF. Opening pressure was 10 cm of water. Electronically Signed   By: Kerby Moors M.D.   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  CT scan from September 2020 shows diffuse lymphadenopathy with splenomegaly.  She has several enlarged lymph nodes and night sweats.   Recent CBCs showed a stable hemoglobin at 10.7, normal platelet count of 197,000, and elevated WBC of 84.3 (stable compared to prior values).  Treatment for her CLL is not recommended at this time. -PML is rare and occurs almost exclusively in immunosuppressed individuals. PML is most likely to be found in patients with lymphoproliferative and myeloproliferative diseases, solid organ malignancies, HIV infection, autoimmune diseases, and in patients on antirejection immunosuppressive drugs after organ transplantation or patients treated with immunomodulatory therapies.  The question has been raised as to whether or not we can treat her underlying CLL which might also address her underlying opportunistic infection.  Consideration has been given to treating her with ibrutinib, and post marketing data, this has been associated with PML and therefore we will not proceed with this.  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  Will check quantitative immunoglobulins.  Consideration will be given to IVIG administration pending these results.  I have spoken with the patient's primary oncologist by telephone who also plans to reach out to colleagues at [facility] for any additional recommendations. 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  I will look for ways to treat her PML, I am concerned that she will have a rapidly progressive decline in her neurological status.  Agree with palliative care consult for goals of care discussion.  Thank you for this referral.   Mikey Bussing, DNP, AGPCNP-BC, AOCNP  ADDENDUM: I received communication from the patient's primary oncologist after my visit with the patient was complete.  He has reached out to both [facility], and [facility] regarding this patient.  They have indicated that there is no data showing that treating the CLL will restore the immune system fast enough to reverse the process.  Additionally, treatment can  also cause immunosuppression which could worsen the process.  There was a suggestion of involving infectious disease who is already on board.  The recommendation from [facility] was to send the patient home with hospice.  Mikey Bussing, DNP, AGPCNP-BC, AOCNP  ADDENDUM: I saw and examined Ms. Moffat this afternoon.  This is very much an unusual situation.  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  I really cannot find a lot of information in the literature regarding PML and CLL that has not treated.  The vast majority of patients with PML and CLL have had some form of therapy.  I do not know if treating the CLL will improve the PML.  Again I really cannot find much information about that.  I was surprised to see that Ms. Neils was actually in pretty good shape.  We were able to have a very nice conversation.  I did talk to her daughter, [name], on the phone.  Her daughter is a Marine scientist up at [employer].  I am sending off immunoglobulin levels to see if they are low.  If they are, I think IVIG would be reasonable.  I would have to think that if her CLL were treated, that she might improve that her immune system might also improve.  Again, I just do not know if we can improve the PML by treating the CLL.  I do think it is worth while trying to treat the CLL.  We certainly cannot use standard therapy (i.e. monoclonal antibodies) nor the BTK inhibitors.  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  I think that it might be reasonable to try her on bendamustine.  This is quite active for CLL even as a single agent.  I realize we are not going to cure the CLL but that we can at least cause significant improvement we might be able to improve her PML.  Ms. Karpowicz and her daughter are agreeable to treatment.  They both understand and realize that treating the CLL may not improve the PML.  I  suppose it could worsen the PML by transiently causing some further immunosuppression.  However, I would think single agent bendamustine would have a very low risk of activating the JC virus and exacerbating the PML.  She will need to have a Port-A-Cath.  She will have to be moved over to [facility].  I realize with the coronavirus, hospital beds might be very difficult to come by.  I realize other just is not a lot of guidance in this situation.  However, given that she has a decent performance status, I feel that it is worthwhile trying to treat the CLL and seeing if this improves the PML.  This is an incredibly complicated situation.  I really do not think that there is a "right or wrong answer."  I spent over an hour with Ms. Santino in with her daughter on the phone.  We will certainly follow along and try to see about doing treatment early next week.  Lattie Haw, MD  Jeneen Rinks 1:5-6

## 2019-09-25 NOTE — Progress Notes (Signed)
Speech Language Pathology Daily Session Note  Patient Details  Name: [name] MRN: [id number] Date of Birth: 04/06/1946  Today's Date: 09/25/2019 SLP Individual Time: 1300-1310 SLP Individual Time Calculation (min): 10 min  Short Term Goals: Week 1: SLP Short Term Goal 1 (Week 1): STGs=LTGs due to ELOS  Skilled Therapeutic Interventions:  Skilled treatment session targeted cognition goals. SLP recieved pt in bed with her daughter present. SLP facilitiated session by providing question cues for pt to sequence how to move for bed mobility and to transfer to wheelchair. Pt able to identify that is was difficult for her to "remember" her right arm and move it. Session was limited as Palliative Care, neuro-oncology and Infectious Disease entered room for consult with pt and her daughter.      Pain    Therapy/Group: Individual Therapy  Frederika Hukill 09/25/2019, 1:57 PM

## 2019-09-26 ENCOUNTER — Inpatient Hospital Stay (HOSPITAL_COMMUNITY): Payer: Medicare Other

## 2019-09-26 LAB — GLUCOSE, CAPILLARY
Glucose-Capillary: 84 mg/dL (ref 70–99)
Glucose-Capillary: 107 mg/dL — ABNORMAL HIGH (ref 70–99)
Glucose-Capillary: 165 mg/dL — ABNORMAL HIGH (ref 70–99)
Glucose-Capillary: 157 mg/dL — ABNORMAL HIGH (ref 70–99)

## 2019-09-26 LAB — HIV ANTIBODY (ROUTINE TESTING W REFLEX): HIV Screen 4th Generation wRfx: NONREACTIVE

## 2019-09-26 LAB — SEDIMENTATION RATE: Sed Rate: 3 mm/hr (ref 0–22)

## 2019-09-26 LAB — SJOGRENS SYNDROME-B EXTRACTABLE NUCLEAR ANTIBODY: SSB (La) (ENA) Antibody, IgG: <0.2 AI (ref 0.0–0.9)

## 2019-09-26 MED ORDER — SENNOSIDES-DOCUSATE SODIUM 8.6-50 MG PO TABS
2.0000 | ORAL_TABLET | Freq: Two times a day (BID) | ORAL | Status: DC
  Administered 2019-09-26 – 2019-09-30 (×8): 2 via ORAL
  Filled 2019-09-26 (×8): qty 2

## 2019-09-26 NOTE — Progress Notes (Signed)
Orthopedic Tech Progress Note Patient Details:  Joelie Ihrig 06/04/1946 [id number] Called [employer] for brace order. Patient ID: Renee [name], female   DOB: 12/20/1945, 74 y.o.   MRN: [id number]   [name] 09/26/2019, 12:47 PM

## 2019-09-26 NOTE — Progress Notes (Signed)
[facility] PHYSICAL MEDICINE & REHABILITATION PROGRESS NOTE  Subjective/Complaints: Pt up in bed. Waiting to get up. Denies any new pain.   ROS: Patient denies fever, rash, sore throat, blurred vision, nausea, vomiting, diarrhea, cough, shortness of breath or chest pain,   headache, or mood change.    Objective: Vital Signs: Blood pressure 114/65, pulse 66, temperature 97.9 F (36.6 C), temperature source Oral, resp. rate 18, weight 78.9 kg, SpO2 97 %. No results found. Recent Labs    09/24/19 0548  WBC 84.3*  HGB 10.7*  HCT 35.6*  PLT 197   No results for input(s): NA, K, CL, CO2, GLUCOSE, BUN, CREATININE, CALCIUM in the last 72 hours.  Physical Exam: BP 114/65   Pulse 66   Temp 97.9 F (36.6 C) (Oral)   Resp 18   Wt 78.9 kg   SpO2 97%   BMI 22.95 kg/m  Constitutional: No distress . Vital signs reviewed. HEENT: EOMI, oral membranes moist Neck: supple Cardiovascular: RRR without murmur. No JVD    Respiratory: CTA Bilaterally without wheezes or rales. Normal effort    GI: BS +, non-tender, non-distended  Skin: Warm and dry.  Intact. Psych: Normal mood.  Normal behavior. Musc: No edema in extremities.  No tenderness in extremities. Neuro: Alert Motor:  RUE: Shoulder abduction, elbow flexion/extension 2/5, handgrip 2 to 2 -/5, emerging flexor tone RUE Left upper extremity: 5/5 proximal distal, unchanged RLE: HF, KE 1/5, ADF 0/5, unchanged--stable Left lower extremity: Hip flexion, knee extension 4+/5, ankle dorsiflexion 5/5  Assessment/Plan: 1. 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  Physiatrist is providing close team supervision and 24 hour management of active medical problems listed below.  Physiatrist and rehab team continue to assess barriers to discharge/monitor patient progress toward functional and medical goals  Care Tool:  Bathing  Bathing activity  did not occur: Refused Body parts bathed by patient: Right arm, Chest, Abdomen, Right upper leg, Left upper leg, Front perineal area, Face, Buttocks   Body parts bathed by helper: Right lower leg, Left lower leg, Left arm Body parts n/a: Front perineal area, Buttocks   Bathing assist Assist Level: Moderate Assistance - Patient 50 - 74%     Upper Body Dressing/Undressing Upper body dressing   What is the patient wearing?: Pull over shirt    Upper body assist Assist Level: Minimal Assistance - Patient > 75%    Lower Body Dressing/Undressing Lower body dressing      What is the patient wearing?: Underwear/pull up, Pants     Lower body assist Assist for lower body dressing: Minimal Assistance - Patient > 75%(bed level supine with bridges)     Toileting Toileting    Toileting assist Assist for toileting: Moderate Assistance - Patient 50 - 74%     Transfers Chair/bed transfer  Transfers assist     Chair/bed transfer assist level: Moderate Assistance - Patient 50 - 74%     Locomotion Ambulation   Ambulation assist      Assist level: 2 helpers Assistive device: Parallel bars Max distance: 3 ft   Walk 10 feet activity   Assist     Assist level: Total Assistance - Patient < 25% Assistive device: Lite Gait   Walk 50 feet activity   Assist Walk 50 feet with 2 turns activity did not occur: Safety/medical concerns  Assist level: Total Assistance - Patient < 25% Assistive device: Lite Gait  Walk 150 feet activity   Assist Walk 150 feet activity did not occur: Safety/medical concerns         Walk 10 feet on uneven surface  activity   Assist Walk 10 feet on uneven surfaces activity did not occur: Safety/medical concerns         Wheelchair     Assist Will patient use wheelchair at discharge?: Yes Type of Wheelchair: Manual    Wheelchair assist level: Minimal Assistance - Patient > 75% Max wheelchair distance: 157ft    Wheelchair 50  feet with 2 turns activity    Assist        Assist Level: Minimal Assistance - Patient > 75%   Wheelchair 150 feet activity     Assist Wheelchair 150 feet activity did not occur: Safety/medical concerns   Assist Level: Minimal Assistance - Patient > 75%      Medical Problem List and Plan:  1. Impaired ADLs/mobility and function secondary to PML with R hemiparesis and spasticity   Continue CIR  Pt willing to work with therapies.  Palliative care following patient and providing guidance regarding goals of care, etc. APPRECIATE their assistance! Plan is for IVIG treatments at [facility]  Neurology has seen patient again-appreciate recs  Discussed with Neuro/Onc findings of MRI, Neuro/Onc to discuss with hematology   functional goals as well as modification of discharge date given new findings on MRI/diagnosis with poor prognosis have been reviewed with rehab team 2. Antithrombotics:   -DVT/anticoagulation: Pharmaceutical: Lovenox   -antiplatelet therapy: n/a  3. Chronic pudendal pain/Pain Management: Cymbalta  Continue methadone bid, afternoon dose added, changed dosing to 10 mg 3 times daily  Monitor with increased mobility  ?Rash allergy to Lyrica-tolerating retrial, increased on 1/15  Baclofen 5 TID started on 1/8, increased on 1/13  Added anusol supp HCT BID  Cont Tucks pad to anal area QID  Continue ice  Relatively controlled with medications on 1/23    4. Mood: LCSW to follow for evaluation and support.   Klonopin 0.5 twice daily for anxiety   Appears relatively controlled on 1/22  -antipsychotic agents: N/A  5. Neuropsych: This patient is ? fully capable of making decisions on her own behalf.  6. Skin/Wound Care: Routine pressure relief measures.  7. 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 Improved.   Creatinine 1.02 on 1/18  Encourage fluids  Cont to monitor 9. Hypothyroid: Resume supplement.  10. H/o depression: On  Lutuda, cymbalta and Clonazepam  11.  PML: Follow up MRI brain ordered, please see above.  Decadron being slowly weaned per neurology 12. Steroid induced hyperglycemia: Hgb A1c-5.4.   Sugars are under control 13. OIC/?IBS: On multiple medications:  Laculose changed to PRN per patient preference  Senna decreased on 1/13  Linzess restarted per pt/family preference   Stable with meds on 1/23--bm today 14. Breast cancer in remission/CLL being observed:   WBCs 84.3 on 1/21.   Discussed with Neuro/Onc and Heme/Onc.  Per Heme/Onc cont to monitor and no cause for concern unless acute spike.  Afebrile  Cont to monitor 14. Iron deficiently anemia: Resolved  Added iron supplement   Hb 10.7 on 1/21  Cont to monitor 15. Spasticity of R ankle, RUE  See #3  [facility]  1/23 ordered [facility]  16.  Neurogenic bladder  Continue ditropan qhs 17.  Right scapular pain  X-ray reviewed, unremarkable for fracture  Voltaren gel ordered on 1/13  Improved 18.  Hypotension  Asymptomatic at present  Stable on 1/23    LOS: 17 days A FACE TO FACE EVALUATION WAS PERFORMED  Meredith Staggers 09/26/2019, 12:19 PM

## 2019-09-26 NOTE — Progress Notes (Signed)
Physical Therapy Session Note  Patient Details  Name: Renee [name] MRN: [id number] Date of Birth: 04/20/1946  Today's Date: 09/26/2019 PT Individual Time: [id number] PT Individual Time Calculation (min): 56 min    Short Term Goals: Week 3:  PT Short Term Goal 1 (Week 3): = to LTGs based on ELOS  Skilled Therapeutic Interventions/Progress Updates:    Pt seated in w/c upon PT arrival, pt reports that her bottom hurts and she needs to get back to bed for pain relief. Pt's daughter present to observe session and for family education. Pt reports needing to use bathroom before getting bed. Pt performed sit<>stands within stedy and transferred w/c<>toilet, standing balance min assist while therapist assisted with clothing management. Pt continent of bowel and bladder. Sit>stand mod assist from commode, standing balance min assist while therapist assisted with pericare and clothing management. Pt transferred to the bed with stedy. Therapist demonstrated working on sit<>stands within stedy at home for strengthening, asked pt to try standing for 60 sec with cues for upright posture, showed daughter. Pt transferred to supine on bed with mod assist. Therapist instructed pt's daughter in [place] for stretching, pt performed bridges x 10 for strengthening. Therapist provided handout for PT education and exercises for pt to do at home. Encouraged pt to try getting OOB at home during meal times (3x per day). Pt left supine in bed with needs in reach and bed alarm set. Daughter present.   Therapy Documentation Precautions:  Precautions Precautions: Fall Precaution Comments: Significant weakness at RLE, impaired R UE proprioception Restrictions Weight Bearing Restrictions: No    Therapy/Group: Individual Therapy  Netta Corrigan, PT, DPT, CSRS 09/26/2019, 7:59 AM

## 2019-09-26 NOTE — Progress Notes (Addendum)
Palliative Medicine Inpatient Follow Up Note   HPI: Renee [name] is a 74 year old female with history of CKD, HTN, CLL, breast cancer s/p lumpectomy with XRT, pudendal neuralgia, anxiety disorder who was admitted on 09/04/19 with2 to 3-week history of multiple falls with progressive weakness affecting FMC of right hand and right foot drop. MRI brain done showing 2 cm cystic lesion with vasogenic edema and question of tumor versus cerebritis. She was transferred to [facility] for further work-up and evaluated by neurology. She was started on Decadron and MRI brain with contrast showed 2 cm subcortical white matter lesion in parietal lobe without enhancement--cerebritis or subacute infarct felt to be unlikely. LP done showing elevated glucose with 1 WBC and 89 RBC. CSF sent for flow cytometry and JC virus. Dr. Malen Gauze recommends biopsy with PET scan and Dr. Mickeal Skinner consulted for input and felt that CNS involvement and CLL was very uncommon.He question nonneoplastic etiology such as vascular inflammatory process and recommend repeating contrast enhanced MRI in 1 month as well as decreasing Decadron to 4 mg daily at discharge. Patient to be discussed at tumor board on 1/11.Aggressive rehab recommended due to ongoing right-sided weakness affecting mobility and ADLs. CIR recommended due to functional decline.  Today's Discussion (09/26/2019): Chart reviewed. Spoke to patient and her daughter, Renee [name]. [name] had a good conversation with Dr. Marin Olp yesterday. The plan will be for her to go to [facility] to start IVIG treatments this upcoming week. We discussed her need to continue physical therapy and push herself to get up and move as able. She said that she is willing to do that. Today she is in much better spirts. It appeared that yesterday she was in a state of shock with all of this new information. She says that she has had some movement of her bowels but that it could be better. She agreed to  increasing her bowel regiment. She remains hopeful for physical improvement.   Discussed with patient the importance of continued conversation with family and their medical providers regarding overall plan of care and treatment options, ensuring decisions are within the context of the patients values and GOCs.  Questions and concerns addressed   Vital Signs Vitals:   09/26/19 0514 09/26/19 0600  BP: (!) 90/55 114/65  Pulse: (!) 56 66  Resp:    Temp: 97.9 F (36.6 C)   SpO2: 97%     Intake/Output Summary (Last 24 hours) at 09/26/2019 1109 Last data filed at 09/26/2019 0900 Gross per 24 hour  Intake 840 ml  Output --  Net 840 ml   Last Weight  Most recent update: 09/14/2019  4:25 AM   Weight  78.9 kg (173 lb 15.1 oz)           Vitals and nursing note reviewed.  HENT:     Head: Normocephalic.     Nose: Nose normal.     Mouth/Throat:     Mouth: Mucous membranes are moist.  Eyes:     Pupils: Pupils are equal, round, and reactive to light.  Cardiovascular:     Rate and Rhythm: Normal rate.  Pulmonary:     Effort: Pulmonary effort is normal.  Abdominal:     General: Abdomen is flat.  Musculoskeletal:        General: Normal range of motion.     Cervical back: Normal range of motion.  Skin:    General: Skin is warm.  Neurological:     General: Inability to use  right side    Mental Status: She is alert.   SUMMARY OF RECOMMENDATIONS   OP palliative follow up with easy transition to hospice if patient determines that is what she'd like   Goals right now are to get stronger, plan for transition to [facility] for CLL tx  DNAR/DNI/No feeding tubes  Code Status/Advance Care Planning:  DNR  Symptom Management:  Goals of Care: - Patient is DNAR/DNI                 - A TOC referral has been placed for Palliative OP  CLL: PML: JC Virus:  - Per review of Oncology notes plan will be for transition to [facility] for placement of a portacath  and  initiation of treatment of CLL  Right Shoulder Pain: Left Hip Pain:                 - Tylenol 650mg  PO TID                 - Lidocaine Patch Qday, on 12 hours/off 12 hours                 - Voltaren Gel  Chronic pudendal paint:                 - Lyrica 50mg  PO BID                 - Methadone 10mg  PO TID                 - Baclofen 5mg  PO TID                 - Anusol suppository BID                 - TUCKS QID  Constipation:                 - Linzess QDay - Lactulose increase to TID  - Senna BID                 - Bisacodyl suppository 10mg  PR QDay   Muscular Weakness:                 - OOB during the day - PT - OT  Xerostomia: - Good oral care QShift - Encourage liquid intake  Anxiety:                 - Klonopin 0.5mg  PO BID  Delirium Precautions: - Get up during the day - Encourage a familiar face to remain present throughout the day - Keep blinds open and lights on during daylight hours - Minimize the use of opioids/benzodiazepines  Emotional Support:                 - Therapeutic Listening  Spiritual: - Chaplain consult  Time Spent: 25 Greater than 50% of the time was spent in counseling and coordination of care ______________________________________________________________________________________ [place] Team Team Cell Phone: [phone] Please utilize secure chat with additional questions, if there is no response within 30 minutes please call the above phone number  Palliative Medicine Team providers are available by phone from 7am to 7pm daily and can be reached through the team cell phone.  Should this patient require assistance outside of these hours, please call the patient's attending physician.

## 2019-09-27 ENCOUNTER — Ambulatory Visit (HOSPITAL_COMMUNITY): Payer: Medicare Other

## 2019-09-27 ENCOUNTER — Inpatient Hospital Stay (HOSPITAL_COMMUNITY): Payer: Medicare Other | Admitting: Speech Pathology

## 2019-09-27 DIAGNOSIS — M6281 Muscle weakness (generalized): Secondary | ICD-10-CM

## 2019-09-27 DIAGNOSIS — Z515 Encounter for palliative care: Secondary | ICD-10-CM

## 2019-09-27 DIAGNOSIS — Z7189 Other specified counseling: Secondary | ICD-10-CM

## 2019-09-27 DIAGNOSIS — M25552 Pain in left hip: Secondary | ICD-10-CM

## 2019-09-27 DIAGNOSIS — K117 Disturbances of salivary secretion: Secondary | ICD-10-CM

## 2019-09-27 DIAGNOSIS — R52 Pain, unspecified: Secondary | ICD-10-CM

## 2019-09-27 LAB — GLUCOSE, CAPILLARY
Glucose-Capillary: 81 mg/dL (ref 70–99)
Glucose-Capillary: 95 mg/dL (ref 70–99)
Glucose-Capillary: 128 mg/dL — ABNORMAL HIGH (ref 70–99)
Glucose-Capillary: 129 mg/dL — ABNORMAL HIGH (ref 70–99)

## 2019-09-27 LAB — IGG, IGA, IGM
IgA: 113 mg/dL (ref 64–422)
IgG (Immunoglobin G), Serum: 937 mg/dL (ref 586–1602)
IgM (Immunoglobulin M), Srm: 31 mg/dL (ref 26–217)

## 2019-09-27 MED ORDER — LACTULOSE 10 GM/15ML PO SOLN
10.0000 g | Freq: Two times a day (BID) | ORAL | Status: DC
  Administered 2019-09-28 – 2019-09-29 (×2): 10 g via ORAL
  Filled 2019-09-27 (×5): qty 15

## 2019-09-27 MED ORDER — ENOXAPARIN SODIUM 40 MG/0.4ML ~~LOC~~ SOLN
40.0000 mg | SUBCUTANEOUS | Status: DC
  Administered 2019-09-29 – 2019-09-30 (×2): 40 mg via SUBCUTANEOUS
  Filled 2019-09-27 (×2): qty 0.4

## 2019-09-27 NOTE — Progress Notes (Signed)
Chief Complaint: Patient was seen in consultation today for port placement.  Referring Physician(s): *Dr. Marin Olp  Supervising Physician: Markus Daft  Patient Status: [name] - In-pt  History of Present Illness: Renee [name] is a 74 y.o. 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 She is currently in Inpt rehab working with therapy for progressive weakness. After consultation with Heme/Onc, she is agreeable to IVIG. She will be transferred to [facility], timing unknown. IR is asked to place port for means of durable IV access as treatment expected to start this week. PMHx, meds, labs, imaging, allergies reviewed. Family at bedside.   Past Medical History:  Diagnosis Date  . Chronic renal disease, stage 3, moderately decreased glomerular filtration rate between 30-59 mL/min/1.73 square meter 12/27/2017  . Constipation   . Depression   . History of breast cancer 12/27/2017  . Hypertension   . Leukemia ([place])    CLL  . PVD (peripheral vascular disease) ([place])   . Thyroid disease     Past Surgical History:  Procedure Laterality Date  . ABDOMINAL HYSTERECTOMY    . BREAST SURGERY     Left Breast Lumpectomy  . COLON SURGERY    . HERNIA REPAIR  2000   Hiatal hernia repair      Allergies: Bupropion, Clonidine derivatives, Codeine, Diclofenac-misoprostol, Olanzapine, Pregabalin, Valdecoxib, and Zonisamide  Medications:  Current Facility-Administered Medications:  .  acetaminophen (TYLENOL) tablet 650 mg, 650 mg, Oral, TID, Rosezella Rumpf, NP, 650 mg at 09/27/19 0730 .  alum & mag hydroxide-simeth (MAALOX/MYLANTA) 200-200-20 MG/5ML suspension 30 mL, 30 mL, Oral, Q4H PRN, Love, Pamela S, PA-C .  baclofen (LIORESAL) tablet 10 mg, 10 mg, Oral, TID, Jamse Arn, MD, 10 mg at 09/27/19 0731 .  bisacodyl (DULCOLAX) suppository 10 mg, 10 mg, Rectal, Daily PRN, Bary Leriche, PA-C, 10 mg at 09/27/19 0752 .  clonazePAM (KLONOPIN) tablet 0.5 mg,  0.5 mg, Oral, BID, Love, Pamela S, PA-C, 0.5 mg at 09/27/19 0730 .  [START ON 10/04/2019] dexamethasone (DECADRON) tablet 1 mg, 1 mg, Oral, Daily, Greta Doom, MD .  Derrill Memo ON 09/29/2019] dexamethasone (DECADRON) tablet 2 mg, 2 mg, Oral, Daily, Leonel Ramsay, Vida Roller, MD .  dexamethasone (DECADRON) tablet 4 mg, 4 mg, Oral, Daily, Greta Doom, MD, 4 mg at 09/27/19 0731 .  diclofenac Sodium (VOLTAREN) 1 % topical gel 2 g, 2 g, Topical, QID, Posey Pronto, Domenick Bookbinder, MD, 2 g at 09/27/19 1222 .  diphenhydrAMINE (BENADRYL) 12.5 MG/5ML elixir 12.5-25 mg, 12.5-25 mg, Oral, Q6H PRN, Love, Pamela S, PA-C .  enoxaparin (LOVENOX) injection 40 mg, 40 mg, Subcutaneous, Q24H, Love, Pamela S, PA-C, 40 mg at 09/27/19 1221 .  feeding supplement (ENSURE ENLIVE) (ENSURE ENLIVE) liquid 237 mL, 237 mL, Oral, TID WC, Patel, Domenick Bookbinder, MD, 237 mL at 09/27/19 1222 .  guaiFENesin-dextromethorphan (ROBITUSSIN DM) 100-10 MG/5ML syrup 5-10 mL, 5-10 mL, Oral, Q6H PRN, Love, Pamela S, PA-C .  hydrocortisone (ANUSOL-HC) suppository 25 mg, 25 mg, Rectal, BID, Kirsteins, Luanna Salk, MD, 25 mg at 09/27/19 0737 .  lactulose (CHRONULAC) 10 GM/15ML solution 10 g, 10 g, Oral, TID, Ferolito, Freada Bergeron, NP, 10 g at 09/27/19 0731 .  levothyroxine (SYNTHROID) tablet 100 mcg, 100 mcg, Oral, QAC breakfast, Love, Pamela S, PA-C, 100 mcg at 09/27/19 0602 .  lidocaine (LIDODERM) 5 % 2 patch, 2 patch, Transdermal, Q24H, Love, Ivan Anchors, PA-C, 2 patch at 09/27/19 0602 .  linaclotide (LINZESS) capsule 145 mcg, 145 mcg, Oral, QAC breakfast, Raulkar,  Clide Deutscher, MD, 145 mcg at 09/27/19 0602 .  lurasidone (LATUDA) tablet 40 mg, 40 mg, Oral, QHS, Love, Pamela S, PA-C, 40 mg at 09/26/19 2001 .  Melatonin TABS 3 mg, 3 mg, Oral, QHS, Love, Pamela S, PA-C, 3 mg at 09/26/19 2001 .  methadone (DOLOPHINE) tablet 10 mg, 10 mg, Oral, Daily, 10 mg at 09/27/19 0730 **AND** methadone (DOLOPHINE) tablet 10 mg, 10 mg, Oral, q1800, Jamse Arn, MD, 10  mg at 09/26/19 1826 .  methadone (DOLOPHINE) tablet 10 mg, 10 mg, Oral, Q1200, Jamse Arn, MD, 10 mg at 09/27/19 1221 .  pantoprazole (PROTONIX) EC tablet 40 mg, 40 mg, Oral, BID, Love, Pamela S, PA-C, 40 mg at 09/27/19 0731 .  polyethylene glycol (MIRALAX / GLYCOLAX) packet 17 g, 17 g, Oral, Daily PRN, Bary Leriche, PA-C, 17 g at 09/15/19 0756 .  pregabalin (LYRICA) capsule 50 mg, 50 mg, Oral, BID, Jamse Arn, MD, 50 mg at 09/27/19 0730 .  prochlorperazine (COMPAZINE) tablet 5-10 mg, 5-10 mg, Oral, Q6H PRN **OR** prochlorperazine (COMPAZINE) injection 5-10 mg, 5-10 mg, Intramuscular, Q6H PRN **OR** prochlorperazine (COMPAZINE) suppository 12.5 mg, 12.5 mg, Rectal, Q6H PRN, Love, Pamela S, PA-C .  rosuvastatin (CRESTOR) tablet 20 mg, 20 mg, Oral, q1800, Love, Pamela S, PA-C, 20 mg at 09/26/19 1826 .  senna-docusate (Senokot-S) tablet 2 tablet, 2 tablet, Oral, BID, Rosezella Rumpf, NP, 2 tablet at 09/27/19 0730 .  sodium phosphate (FLEET) 7-19 GM/118ML enema 1 enema, 1 enema, Rectal, Once PRN, Love, Ivan Anchors, PA-C .  witch hazel-glycerin (TUCKS) pad, , Topical, TID AC & HS, Kirsteins, Luanna Salk, MD, Given at 09/27/19 0603    Family History  Problem Relation Age of Onset  . Healthy Mother   . Healthy Father   . Healthy Sister        "normal stuff for her age"  . Pancreatic cancer Brother     Social History   Socioeconomic History  . Marital status: Widowed    Spouse name: Not on file  . Number of children: Not on file  . Years of education: Not on file  . Highest education level: Not on file  Occupational History  . Not on file  Tobacco Use  . Smoking status: Former Smoker    Packs/day: 0.75    Types: Cigarettes    Start date: 09/19/1962    Quit date: 09/20/1979    Years since quitting: 40.0  . Smokeless tobacco: Never Used  Substance and Sexual Activity  . Alcohol use: No    Alcohol/week: 0.0 standard drinks  . Drug use: No  . Sexual activity: Never  Other  Topics Concern  . Not on file  Social History Narrative  . Not on file   Social Determinants of Health   Financial Resource Strain:   . Difficulty of Paying Living Expenses: Not on file  Food Insecurity:   . Worried About Charity fundraiser in the Last Year: Not on file  . Ran Out of Food in the Last Year: Not on file  Transportation Needs:   . Lack of Transportation (Medical): Not on file  . Lack of Transportation (Non-Medical): Not on file  Physical Activity:   . Days of Exercise per Week: Not on file  . Minutes of Exercise per Session: Not on file  Stress:   . Feeling of Stress : Not on file  Social Connections:   . Frequency of Communication with Friends and Family: Not on file  .  Frequency of Social Gatherings with Friends and Family: Not on file  . Attends Religious Services: Not on file  . Active Member of Clubs or Organizations: Not on file  . Attends Archivist Meetings: Not on file  . Marital Status: Not on file    Review of Systems: A 12 point ROS discussed and pertinent positives are indicated in the HPI above.  All other systems are negative.  Review of Systems  Vital Signs: BP (!) 109/56 (BP Location: Left Arm)   Pulse 60   Temp 98 F (36.7 C)   Resp 18   Wt 78.9 kg   SpO2 97%   BMI 22.95 kg/m   Physical Exam Constitutional:      Appearance: Normal appearance.  HENT:     Mouth/Throat:     Mouth: Mucous membranes are moist.     Pharynx: Oropharynx is clear.  Cardiovascular:     Rate and Rhythm: Normal rate and regular rhythm.     Heart sounds: Normal heart sounds.  Pulmonary:     Effort: Pulmonary effort is normal. No respiratory distress.     Breath sounds: Normal breath sounds.  Skin:    General: Skin is warm and dry.  Neurological:     Mental Status: She is alert.  Psychiatric:        Mood and Affect: Mood normal.        Thought Content: Thought content normal.        Judgment: Judgment normal.      Labs:  CBC: Recent  Labs    09/14/19 0645 09/16/19 0459 09/21/19 0553 09/24/19 0548  WBC 89.7* 85.9* 72.4* 84.3*  HGB 10.7* 10.4* 10.3* 10.7*  HCT 35.8* 34.4* 34.3* 35.6*  PLT 265 253 191 197    COAGS: Recent Labs    09/03/19 1716  INR 0.9  APTT 21*    BMP: Recent Labs    09/14/19 0645 09/17/19 0504 09/18/19 0453 09/21/19 0553  NA 138 139 137 139  K 4.7 4.6 4.2 4.3  CL 105 107 105 106  CO2 27 26 23 24   GLUCOSE 104* 118* 108* 102*  BUN 17 22 20 18   CALCIUM 8.8* 8.7* 8.9 8.6*  CREATININE 1.17* 1.22* 0.98 1.02*  GFRNONAA 47* 45* 58* 55*  GFRAA 54* 52* >60 >60    LIVER FUNCTION TESTS: Recent Labs    09/03/19 1716 09/05/19 0757 09/06/19 0551 09/10/19 0515  BILITOT 1.2 0.7 0.9 0.9  AST 24 18 16 17   ALT 18 15 13 16   ALKPHOS 74 52 59 66  PROT 7.3 5.9* 5.9* 6.5  ALBUMIN 4.4 3.6 3.6 3.7    TUMOR MARKERS: No results for input(s): AFPTM, CEA, CA199, CHROMGRNA in the last 8760 hours.  Assessment and Plan: CLL/PML(progressive multifocal leukoencephalopathy) Plan for IVIG to start this week. Pending transfer to [facility]. Will plan for port placement in the next 1-2 days, whether at [facility] or [facility]. Labs reviewed. Risks and benefits of image guided port-a-catheter placement was discussed with the patient including, but not limited to bleeding, infection, pneumothorax, or fibrin sheath development and need for additional procedures.  All of the patient's questions were answered, patient is agreeable to proceed. Consent signed and in chart.    Thank you for this interesting consult.  I greatly enjoyed meeting Renee [name] and look forward to participating in their care.  A copy of this report was sent to the requesting provider on this date.  Electronically Signed: Ascencion Dike, PA-C 09/27/2019, 1:14  PM   I spent a total of 20 minutes in face to face in clinical consultation, greater than 50% of which was counseling/coordinating care for port

## 2019-09-27 NOTE — Progress Notes (Addendum)
Speech Language Pathology Daily Session Note  Patient Details  Name: Renee [name] MRN: [id number] Date of Birth: 1945/12/14  Today's Date: 09/27/2019 SLP Individual Time: [id number] SLP Individual Time Calculation (min): 45 min  Short Term Goals: Week 1: SLP Short Term Goal 1 (Week 1): STGs=LTGs due to ELOS  Skilled Therapeutic Interventions: Patient received skilled SLP services targeting cognitive goals. Patient reported need to use the bathroom. Assisted CNA with transferring patient to bathroom. Patient independently utilized visual aid to recall steps to transfer to edge of bed and get to the bathroom. Min verbal cues were needed to recall to move her right arm. Patient participated in a mildly complex problem solving task for money management (adding/subtracting amounts for department store receipts) with 80% accuracy and min verbal cues. At the end of therapy session patient was upright in bed, bed alarm activated, and all needs within reach.  Pain Pain Assessment Pain Scale: 0-10 Pain Score: 0-No pain  Therapy/Group: Individual Therapy  Cristy Folks 09/27/2019, 9:43 AM

## 2019-09-27 NOTE — Progress Notes (Signed)
[place] PHYSICAL MEDICINE & REHABILITATION PROGRESS NOTE  Subjective/Complaints: Having intermittent pudendal pain, increased intensity at times. Medications help  ROS: Patient denies fever, rash, sore throat, blurred vision, nausea, vomiting, diarrhea, cough, shortness of breath or chest pain,  headache, or mood change.   Objective: Vital Signs: Blood pressure (!) 109/56, pulse 60, temperature 98 F (36.7 C), resp. rate 18, weight 78.9 kg, SpO2 97 %. No results found. No results for input(s): WBC, HGB, HCT, PLT in the last 72 hours. No results for input(s): NA, K, CL, CO2, GLUCOSE, BUN, CREATININE, CALCIUM in the last 72 hours.  Physical Exam: BP (!) 109/56 (BP Location: Left Arm)   Pulse 60   Temp 98 F (36.7 C)   Resp 18   Wt 78.9 kg   SpO2 97%   BMI 22.95 kg/m  Constitutional: No distress . Vital signs reviewed. HEENT: EOMI, oral membranes moist Neck: supple Cardiovascular: RRR without murmur. No JVD    Respiratory: CTA Bilaterally without wheezes or rales. Normal effort    GI: BS +, non-tender, non-distended  Skin: Warm and dry.  Intact. Psych: Normal mood.  Normal behavior. Musc: No edema in extremities.  No tenderness in extremities. Neuro: Alert Motor:  RUE: Shoulder abduction, elbow flexion/extension 2/5, handgrip 2 to 2 -/5, emerging flexor tone RUE---wearing WHO Left upper extremity: 5/5 proximal distal, unchanged RLE: HF, KE 1/5, ADF 0/5, unchanged--stable Left lower extremity: Hip flexion, knee extension 4+/5, ankle dorsiflexion 5/5  Assessment/Plan: 1. 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  Physiatrist is providing close team supervision and 24 hour management of active medical problems listed below.  Physiatrist and rehab team continue to assess barriers to discharge/monitor patient progress toward functional and medical goals  Care  Tool:  Bathing  Bathing activity did not occur: Refused Body parts bathed by patient: Right arm, Chest, Abdomen, Right upper leg, Left upper leg, Front perineal area, Face, Buttocks   Body parts bathed by helper: Right lower leg, Left lower leg, Left arm Body parts n/a: Front perineal area, Buttocks   Bathing assist Assist Level: Moderate Assistance - Patient 50 - 74%     Upper Body Dressing/Undressing Upper body dressing   What is the patient wearing?: Pull over shirt    Upper body assist Assist Level: Minimal Assistance - Patient > 75%    Lower Body Dressing/Undressing Lower body dressing      What is the patient wearing?: Underwear/pull up, Pants     Lower body assist Assist for lower body dressing: Minimal Assistance - Patient > 75%(bed level supine with bridges)     Toileting Toileting    Toileting assist Assist for toileting: Moderate Assistance - Patient 50 - 74%     Transfers Chair/bed transfer  Transfers assist     Chair/bed transfer assist level: Moderate Assistance - Patient 50 - 74%     Locomotion Ambulation   Ambulation assist      Assist level: 2 helpers Assistive device: Parallel bars Max distance: 3 ft   Walk 10 feet activity   Assist     Assist level: Total Assistance - Patient < 25% Assistive device: Lite Gait   Walk 50 feet activity   Assist Walk 50 feet with 2 turns activity did not occur: Safety/medical concerns  Assist level: Total Assistance - Patient < 25% Assistive device: Lite Gait    Walk 150 feet activity   Assist Walk 150 feet  activity did not occur: Safety/medical concerns         Walk 10 feet on uneven surface  activity   Assist Walk 10 feet on uneven surfaces activity did not occur: Safety/medical concerns         Wheelchair     Assist Will patient use wheelchair at discharge?: Yes Type of Wheelchair: Manual    Wheelchair assist level: Minimal Assistance - Patient > 75% Max wheelchair  distance: 149ft    Wheelchair 50 feet with 2 turns activity    Assist        Assist Level: Minimal Assistance - Patient > 75%   Wheelchair 150 feet activity     Assist Wheelchair 150 feet activity did not occur: Safety/medical concerns   Assist Level: Minimal Assistance - Patient > 75%      Medical Problem List and Plan:  1. Impaired ADLs/mobility and function secondary to PML with R hemiparesis and spasticity   Continue CIR   Pt working with therapies.  Palliative care following patient and providing guidance regarding goals of care, etc. APPRECIATE their assistance! Plan is for IVIG treatments at [facility]  Neurology has seen patient again-appreciate recs    Neuro/Onc to discuss with hematology   Functional goals as well as modification of discharge date given new findings on MRI/diagnosis with poor prognosis have been reviewed with rehab team 2. Antithrombotics:   -DVT/anticoagulation: Pharmaceutical: Lovenox   -antiplatelet therapy: n/a  3. Chronic pudendal pain/Pain Management: Cymbalta  Continue methadone bid, afternoon dose added, changed dosing to 10 mg 3 times daily  Monitor with increased mobility  ?Rash allergy to Lyrica-tolerating retrial, increased on 1/15  Baclofen 5 TID started on 1/8, increased on 1/13  Added anusol supp HCT BID  Cont Tucks pad to anal area QID  Continue ice  1/24 will try increasing lyrica to 50mg  TID   -consider further baclofen titration    4. Mood: LCSW to follow for evaluation and support.   Klonopin 0.5 twice daily for anxiety   Appears relatively controlled on 1/24  -antipsychotic agents: N/A  5. Neuropsych: This patient is ? fully capable of making decisions on her own behalf.  6. Skin/Wound Care: Routine pressure relief measures.  7. 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 Improved.   Creatinine 1.02 on 1/18  Encourage fluids  Labs tomorrow 9. Hypothyroid: Resume supplement.  10.  H/o depression: On Lutuda, cymbalta and Clonazepam  11.  PML: Follow up MRI brain ordered, please see above.  Decadron being slowly weaned per neurology 12. Steroid induced hyperglycemia: Hgb A1c-5.4.   Sugars are under control 13. OIC/?IBS: On multiple medications:  Laculose changed to PRN per patient preference  Senna decreased on 1/13  Linzess restarted per pt/family preference   Stable with meds on 1/23--bm  14. Breast cancer in remission/CLL being observed:   WBCs 84.3 on 1/21.   Discussed with Neuro/Onc and Heme/Onc.  Per Heme/Onc cont to monitor and no cause for concern unless acute spike.  Afebrile  Cont to monitor 14. Iron deficiently anemia: Resolved  Added iron supplement   Hb 10.7 on 1/21  Cont to monitor 15. Spasticity of R ankle, RUE  See #3  [facility]  1/23 ordered [facility]  16.  Neurogenic bladder  Continue ditropan qhs 17.  Right scapular pain  X-ray reviewed, unremarkable for fracture  Voltaren gel ordered on 1/13  Improved 18.  Hypotension  Asymptomatic at present  Stable on 1/23    LOS: 18  days A FACE TO FACE EVALUATION WAS PERFORMED  Meredith Staggers 09/27/2019, 11:32 AM

## 2019-09-27 NOTE — Progress Notes (Addendum)
Palliative Medicine Inpatient Follow Up Note   HPI: Renee [name] is a 74 year old female with history of CKD, HTN, CLL, breast cancer s/p lumpectomy with XRT, pudendal neuralgia, anxiety disorder who was admitted on 09/04/19 with2 to 3-week history of multiple falls with progressive weakness affecting FMC of right hand and right foot drop. MRI brain done showing 2 cm cystic lesion with vasogenic edema and question of tumor versus cerebritis. She was transferred to [facility] for further work-up and evaluated by neurology. She was started on Decadron and MRI brain with contrast showed 2 cm subcortical white matter lesion in parietal lobe without enhancement--cerebritis or subacute infarct felt to be unlikely. LP done showing elevated glucose with 1 WBC and 89 RBC. CSF sent for flow cytometry and JC virus. Dr. Malen Gauze recommends biopsy with PET scan and Dr. Mickeal Skinner consulted for input and felt that CNS involvement and CLL was very uncommon.He question nonneoplastic etiology such as vascular inflammatory process and recommend repeating contrast enhanced MRI in 1 month as well as decreasing Decadron to 4 mg daily at discharge. Patient to be discussed at tumor board on 1/11.Aggressive rehab recommended due to ongoing right-sided weakness affecting mobility and ADLs. CIR recommended due to functional decline.  Today's Discussion (09/27/2019): Chart reviewed. Spoke to patient and her son, daughter, [name] and her husband, Barbaraann Rondo.  They had an education session today with physical therapy on how to mobilize patient when she discharges. We talked more about the plan for a porta-cath placement and transition to [facility]. Both miss Urban and [name] are hopeful that these treatments help the patient but they realize that if it becomes too burdensome they do not need to continue. As far as miss Blairs physical complaints her constipation has improved, we decreased her lactulose back to BID dosing  frequency. We talked about the need to remain mobile upon transfer.   Discussed with patient the importance of continued conversation with family and their medical providers regarding overall plan of care and treatment options, ensuring decisions are within the context of the patients values and GOCs.  Questions and concerns addressed   Palliative care will continue to follow when transfer occurs.   Vital Signs Vitals:   09/26/19 2012 09/27/19 0436  BP: (!) 110/54 (!) 109/56  Pulse: 70 60  Resp: 18   Temp: (!) 97.5 F (36.4 C) 98 F (36.7 C)  SpO2: 98% 97%    Intake/Output Summary (Last 24 hours) at 09/27/2019 1611 Last data filed at 09/27/2019 0900 Gross per 24 hour  Intake 600 ml  Output -  Net 600 ml   Last Weight  Most recent update: 09/14/2019  4:25 AM   Weight  78.9 kg (173 lb 15.1 oz)           Vitals and nursing note reviewed.  HENT:     Head: Normocephalic.     Nose: Nose normal.     Mouth/Throat:     Mouth: Mucous membranes are moist.  Eyes:     Pupils: Pupils are equal, round, and reactive to light.  Cardiovascular:     Rate and Rhythm: Normal rate.  Pulmonary:     Effort: Pulmonary effort is normal.  Abdominal:     General: Abdomen is flat.  Musculoskeletal:        General: Normal range of motion.     Cervical back: Normal range of motion.  Skin:    General: Skin is warm.  Neurological:     General:  Inability to use right side    Mental Status: She is alert.   SUMMARY OF RECOMMENDATIONS   Palliative to follow up at [facility] right now are to get stronger, plan for transition to [facility] for CLL tx  DNAR/DNI/No feeding tubes  OP palliative follow up with easy transition to hospice if patient determines the treatments are too burdensome  Code Status/Advance Care Planning:  DNR  Symptom Management:  Goals of Care: - Patient is DNAR/DNI                 - A TOC referral has been placed for Palliative OP  CLL:  PML: JC Virus:  - Per review of Oncology notes plan will be for transition to [facility] for placement of a portacath  and initiation of treatment of CLL  Right Shoulder Pain: Left Hip Pain:                 - Tylenol 650mg  PO TID                 - Lidocaine Patch Qday, on 12 hours/off 12 hours                 - Voltaren Gel  Chronic pudendal paint:                 - Lyrica 50mg  PO BID                 - Methadone 10mg  PO TID                 - Baclofen 5mg  PO TID                 - Anusol suppository BID                 - TUCKS QID  Constipation:                 - Linzess QDay - Lactulose increase to TID  - Senna BID                 - Bisacodyl suppository 10mg  PR QDay   Muscular Weakness:                 - OOB during the day - PT - OT  Xerostomia: - Good oral care QShift - Encourage liquid intake  Anxiety:                 - Klonopin 0.5mg  PO BID  Delirium Precautions: - Get up during the day - Encourage a familiar face to remain present throughout the day - Keep blinds open and lights on during daylight hours - Minimize the use of opioids/benzodiazepines  Emotional Support:                 - Therapeutic Listening  Spiritual: - Chaplain consult  Time Spent: 25 Greater than 50% of the time was spent in counseling and coordination of care ______________________________________________________________________________________ [place] Team Team Cell Phone: [phone] Please utilize secure chat with additional questions, if there is no response within 30 minutes please call the above phone number  Palliative Medicine Team providers are available by phone from 7am to 7pm daily and can be reached through the team cell phone.  Should this patient  require assistance outside of these hours, please call the patient's attending physician.

## 2019-09-27 NOTE — Progress Notes (Signed)
Physical Therapy Session Note  Patient Details  Name: Renee [name] MRN: [id number] Date of Birth: 1945/12/23  Today's Date: 09/27/2019 PT Individual Time: 1302-1400 PT Individual Time Calculation (min): 58 min    Short Term Goals: Week 3:  PT Short Term Goal 1 (Week 3): = to LTGs based on ELOS  Skilled Therapeutic Interventions/Progress Updates:    Pt supine in bed upon PT arrival, agreeable to therapy tx and reports R shoulder pain during transfers, repositioned and supported for pain relief. Pt's family present this session for family education- daughter [name]), son in law and her son Aaron Edelman). Therapist educated family on how to don/doff R AFO and importance of wearing this during the day. Pt transferred supine<>sitting x 4 this session with mod assist, therapist instructing in techniques and each family member practiced this. Pt transferred sit>stand within the stedy mod assist, upon standing pt reports incontinence. Pt transferred to toilet with assist from daughter and therapist, pt continent of bladder, daughter assisted with pericare and clothing management while therapist assisted with standing balance. (son and son in law stepped out for toileting) Therapist educated family that it would be helpful to have 2 people present during toileting so that one person can focus on clothes while the other assists with standing balance. Pt transferred back to bed, donned clean clothes, sit>stand within stedy to pull pants over hips. Therapist provided education to family on stedy transfer, son performed stedy transfer to w/c with cues from therapist. Therapist then instructed family in squat pivot transfer technique, towards the patients L side, using her L arm to reach for arm rest of bedrail. Performed x 1 transfer with therapist providing education, and performed x 3 more squat pivot transfers with each family member providing assist. Sit>supine mod assist and pt left supine in bed with needs in reach  and bed alarm set, family present.   Therapy Documentation Precautions:  Precautions Precautions: Fall Precaution Comments: Significant weakness at RLE, impaired R UE proprioception Restrictions Weight Bearing Restrictions: No   Therapy/Group: Individual Therapy  Netta Corrigan, PT, DPT, CSRS 09/27/2019, 12:13 PM

## 2019-09-28 ENCOUNTER — Encounter (HOSPITAL_COMMUNITY): Payer: Medicare Other | Admitting: Occupational Therapy

## 2019-09-28 ENCOUNTER — Inpatient Hospital Stay (HOSPITAL_COMMUNITY): Payer: Medicare Other | Admitting: Occupational Therapy

## 2019-09-28 ENCOUNTER — Ambulatory Visit (HOSPITAL_COMMUNITY): Payer: Medicare Other

## 2019-09-28 ENCOUNTER — Encounter (HOSPITAL_COMMUNITY): Payer: Medicare Other | Admitting: Speech Pathology

## 2019-09-28 LAB — BASIC METABOLIC PANEL
Anion gap: 11 (ref 5–15)
BUN: 20 mg/dL (ref 8–23)
CO2: 26 mmol/L (ref 22–32)
Calcium: 8.8 mg/dL — ABNORMAL LOW (ref 8.9–10.3)
Chloride: 102 mmol/L (ref 98–111)
Creatinine, Ser: 0.9 mg/dL (ref 0.44–1.00)
GFR calc Af Amer: >60 mL/min (ref >60)
GFR calc non Af Amer: >60 mL/min (ref >60)
Glucose, Bld: 90 mg/dL (ref 70–99)
Potassium: 4.6 mmol/L (ref 3.5–5.1)
Sodium: 139 mmol/L (ref 135–145)

## 2019-09-28 LAB — PROTEIN ELECTROPHORESIS, SERUM
A/G Ratio: 1.3 (ref 0.7–1.7)
Albumin ELP: 3.5 g/dL (ref 2.9–4.4)
Alpha-1-Globulin: 0.2 g/dL (ref 0.0–0.4)
Alpha-2-Globulin: 0.7 g/dL (ref 0.4–1.0)
Beta Globulin: 1.1 g/dL (ref 0.7–1.3)
Gamma Globulin: 0.8 g/dL (ref 0.4–1.8)
Globulin, Total: 2.7 g/dL (ref 2.2–3.9)
Total Protein ELP: 6.2 g/dL (ref 6.0–8.5)

## 2019-09-28 LAB — HIV-1 RNA QUANT-NO REFLEX-BLD
HIV 1 RNA Quant: <20 copies/mL
LOG10 HIV-1 RNA: UNDETERMINED log10copy/mL

## 2019-09-28 LAB — GLUCOSE, CAPILLARY
Glucose-Capillary: 72 mg/dL (ref 70–99)
Glucose-Capillary: 105 mg/dL — ABNORMAL HIGH (ref 70–99)
Glucose-Capillary: 131 mg/dL — ABNORMAL HIGH (ref 70–99)
Glucose-Capillary: 130 mg/dL — ABNORMAL HIGH (ref 70–99)

## 2019-09-28 LAB — ANA W/REFLEX IF POSITIVE: Anti Nuclear Antibody (ANA): NEGATIVE

## 2019-09-28 NOTE — Progress Notes (Signed)
Occupational Therapy Discharge Summary  Patient Details  Name: Renee [name] MRN: [id number] Date of Birth: [birth year]  Renee [name] has a progressive condition that has caused her to lose function and strength at a rapid rate during her hospital stay. On admission she had full RUE AROM but very impaired proprioception and sensation so her arm had significant lack of control. She has gradually lost strength and now it is flaccid, no AROM.  On admission, she had increased tone in trunk and RLE that caused her to have many challenges with postural control/ sitting balance and during her admission the strength loss has further impaired her balance to the point where she now needs max-total A with unsupported sitting balance.    Renee [name] has put a great deal of effort into her therapy but was also significantly limited by chronic rectal pain and R shoulder blade pain when sitting up.    LTGs initially set at min A and downgraded to mod A - max A.   Patient has met 7 of 8 long term goals due to ability to compensate for deficits and improved awareness.  Patient to discharge at overall Mod Assist level with UB self care, max - total LB self care and toileting.  Patient's care partner is independent to provide the necessary physical and cognitive assistance at discharge.  Her family has purchased a stedy lift for home, hired personal care attendants and learned how to assist her now and in the case of further decline.   Reasons goals not met: pt did not meet dynamic sit balance of mod A.  She now requires mod A with static sit balance and dynamic is not even safe for her as she falls back and to her R suddenly and frequently.    Recommendation:  Patient will benefit from ongoing skilled OT services in home health setting to continue to advance functional skills in the area of BADL and Reduce care partner burden.  Equipment: drop arm BSC  Reasons for discharge: lack of progress toward goals and discharge  from hospital  Patient/family agrees with progress made and goals achieved: Yes  OT Discharge ADL ADL Eating: Set up Where Assessed-Eating: Bed level Grooming: Setup Where Assessed-Grooming: Bed level Upper Body Bathing: Minimal assistance Where Assessed-Upper Body Bathing: Shower Lower Body Bathing: Maximal assistance Where Assessed-Lower Body Bathing: Shower Upper Body Dressing: Moderate assistance Where Assessed-Upper Body Dressing: Edge of bed Lower Body Dressing: Dependent Where Assessed-Lower Body Dressing: Chair Toileting: Maximal assistance Where Assessed-Toileting: Glass blower/designer: Moderate assistance Toilet Transfer Method: Other (comment)(stedy) Science writer: Grab bars, Raised toilet seat Social research officer, government Method: Other (comment)(stedy lift) Youth worker: Other (comment)(open seat padded chair) Vision Patient Visual Report: No change from baseline Vision Assessment?: No apparent visual deficits Perception  Perception: Impaired Inattention/Neglect: Does not attend to right side of body Praxis Praxis: Impaired Praxis Impairment Details: Motor planning Cognition Overall Cognitive Status: Impaired/Different from baseline Memory: Impaired Memory Impairment: Decreased short term memory Decreased Short Term Memory: Functional basic Sensation Sensation Light Touch: Impaired Detail Light Touch Impaired Details: Impaired RLE;Impaired RUE Hot/Cold: Impaired by gross assessment Proprioception: Impaired Detail Proprioception Impaired Details: Impaired RUE;Impaired RLE Stereognosis: Impaired by gross assessment Coordination Gross Motor Movements are Fluid and Coordinated: No Fine Motor Movements are Fluid and Coordinated: No Coordination and Movement Description: no active movement in RUE and RLE Motor  Motor Motor: Hemiplegia;Abnormal tone Motor - Discharge Observations: flaccid RUE and RLE Mobility    mod A with STEDY, mod  A bed mobility Trunk/Postural Assessment  Postural Control Postural Control: Deficits on evaluation Righting Reactions: absent posterior and R Postural Limitations: constant R/posteroir lean  Balance Static Sitting Balance Static Sitting - Level of Assistance: 2: Max assist Dynamic Sitting Balance Dynamic Sitting - Level of Assistance: 1: +1 Total assist Static Standing Balance Static Standing - Level of Assistance: 1: +1 Total assist(needs to use Stedy to stand) Dynamic Standing Balance Dynamic Standing - Level of Assistance: Not tested (comment)(dynamic standing no longer safe) Extremity/Trunk Assessment RUE Assessment Passive Range of Motion (PROM) Comments: WFL Active Range of Motion (AROM) Comments: 0 General Strength Comments: 0 LUE Assessment LUE Assessment: Within Functional Limits   Nareh Matzke 09/28/2019, 2:14 PM

## 2019-09-28 NOTE — Progress Notes (Signed)
Nutrition Follow-up  RD working remotely.  DOCUMENTATION CODES:   Non-severe (moderate) malnutrition in context of chronic illness  INTERVENTION:   - Please obtain updated weight  - ContinueEnsure Enlive poTID, each supplement provides 350 kcal and 20 grams of protein  -ContinueMagic cupBID withlunch and dinnermeals, each supplement provides 290 kcal and 9 grams of protein  - Encourage continued adequate PO intake  NUTRITION DIAGNOSIS:   Moderate Malnutrition related to chronic illness as evidenced by mild fat depletion, mild muscle depletion, moderate muscle depletion.  Ongoing, being addressed via supplements  GOAL:   Patient will meet greater than or equal to 90% of their needs  Progressing  MONITOR:   PO intake, Supplement acceptance, Labs, Weight trends  REASON FOR ASSESSMENT:   Malnutrition Screening Tool    ASSESSMENT:   74 year old female with PMH of CKD, HTN, CLL, breast cancer s/p lumpectomy with XRT, pudendal neuralgia, anxiety disorder. Pt was admitted on 09/04/19 with history of multiple falls with progressive weakness. MRI brain done showing 2 cm cystic lesion with vasogenic edema and question of tumor versus cerebritis. Pt was started on Decadron and MRI brain with contrast showed 2 cm subcortical white matter lesion in parietal lobe without enhancement. Pt admitted to [facility] on 1/05.  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 Plan is for pt to transfer to [facility] to start IVIG treatments this week. Pt with have a port-a-cath placed.  Pt accepting >90% of Ensure Enlive supplements per [facility] documentation. PO intake remains excellent.  Unable to reach pt via phone call to room.  Pt needs an updated weight. Last weight is from 09/14/19.  Meal Completion: 100% for 7 out of the last 8 recorded meals  Medications reviewed and include: decadron, Ensure Enlive TID, lactulose, protonix, senna  Labs reviewed: WBC 89.7 CBG's: 71-129 x 24  hours  Diet Order:   Diet Order            Diet regular Room service appropriate? Yes; Fluid consistency: Thin  Diet effective now              EDUCATION NEEDS:   Education needs have been addressed  Skin:  Skin Assessment: Reviewed RN Assessment  Last BM:  09/28/19 medium type 5  Height:   Ht Readings from Last 1 Encounters:  09/03/19 6\' 1"  (1.854 m)    Weight:   Wt Readings from Last 1 Encounters:  09/14/19 78.9 kg    Ideal Body Weight:  75 kg  BMI:  Body mass index is 22.95 kg/m.  Estimated Nutritional Needs:   Kcal:  1900-2100  Protein:  95-110 grams  Fluid:  >/= 1.9 L    Gaynell Face, MS, RD, LDN Inpatient Clinical Dietitian Pager: [phone] Weekend/After Hours: [phone]

## 2019-09-28 NOTE — Progress Notes (Addendum)
Brief oncology note:  I spoke with Dr. Marin Olp around 1215 pm today by telephone. In consultation with the patient's primary oncologist, treatment of the CLL would not improve the PML and may also hasten the disease process. Primary oncologist has spoken with the patient's daughter and has advised her about this and has recommended Hospice. We will not plan to proceed with chemotherapy and instead recommend for the patient to discharge home with Hospice.  Discussed this recommendation with the patient and her daughter, [name], who was at the bedside.  They understand the change in recommendation and are agreeable to going home with hospice.  I will place the transition of care order to begin the process of arranging for home hospice.  Mikey Bussing, DNP, AGPCNP-BC, AOCNP  ADDENDUM:  I agree with the above!!  Lattie Haw, MD

## 2019-09-28 NOTE — Plan of Care (Signed)
  Problem: Consults Goal: RH GENERAL PATIENT EDUCATION Description: See Patient Education module for education specifics. Outcome: Progressing Goal: Nutrition Consult-if indicated Outcome: Progressing   Problem: RH BOWEL ELIMINATION Goal: RH STG MANAGE BOWEL WITH ASSISTANCE Description: STG Manage Bowel with mod Assistance.  Outcome: Progressing   Problem: RH BLADDER ELIMINATION Goal: RH STG MANAGE BLADDER WITH ASSISTANCE Description: STG Manage Bladder With mod Assistance Outcome: Progressing   Problem: RH SKIN INTEGRITY Goal: RH STG SKIN FREE OF INFECTION/BREAKDOWN Description: Patients skin will remain free from further infection or breakdown with min assist. Outcome: Progressing   Problem: RH SAFETY Goal: RH STG ADHERE TO SAFETY PRECAUTIONS W/ASSISTANCE/DEVICE Description: STG Adhere to Safety Precautions With min Assistance/Device. Outcome: Progressing   Problem: RH PAIN MANAGEMENT Goal: RH STG PAIN MANAGED AT OR BELOW PT'S PAIN GOAL Description: < 3 Outcome: Progressing

## 2019-09-28 NOTE — Progress Notes (Signed)
Neurology progress note  Subjective: Patient seen and examined.  Offers no new complaints  Objective: Neurological exam Awake alert interactive appropriate Cranial nerves: Pupils equal round reactive to light, subtle right nasolabial fold flattening, extraocular movements and visual fields intact. Motor exam: Flaccid right upper and lower extremity with decreased tone.  Normal strength on the left Sensory exam: Diminished on the right. Coordination: Unable to perform on the right, no dysmetria on the left.  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 This is very unusual-she has no active immune suppressant on board.  Neuro-oncology does not think this is a primary or metastatic brain tumor. Heme-onc-on board.  Plan was to do IVIG if IgG levels are low but she has normal IgG levels.  IVIG, to treat some immunodeficiency which may be predisposing to PML, could have been something that is theoretically conceivable but chemotherapy for CLL might just hasten the inevitable progressive fatality of this disease. This case has been discussed with our neuro oncologist, Dr. Mickeal Skinner with other specialists-especially notably one at [facility], who do not think that treating CLL will restore immune system fast enough, and the induction of immune suppression might worsen the PML. They did not offer any further treatment and unfortunately opine that this is going to be a progressively fatal disease.  I discussed this case over the phone with Dr. Mickeal Skinner and also with Dr. Linus Salmons from infectious diseases to ensure that we are all on the same page about concern that PML might be exacerbated with chemo.  Dr. Linus Salmons and  Dr. Marin Olp have also spoken and Dr. Marin Olp has discussed with family the possibility of worsening PML but family agrees to proceed with it anyway.  I would let Hem-onc take the leading role in managing the hem malignancy and neurology service will be available as needed.  She should follow with outpatient neurology on discharge if needed.  -- Amie Portland, MD [facility] Pager: [phone] If 7pm to 7am, please call on call as listed on AMION.

## 2019-09-28 NOTE — Progress Notes (Signed)
  Patient ID: Renee [name], female   DOB: 01-Jul-1946, 74 y.o.   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 Patient requires frequent and immediate changes in body position which cannot be achieved with a normal bed to allow for pain relief, skin protection and transfers.  [name], PA-C

## 2019-09-28 NOTE — Progress Notes (Addendum)
Speech Language Pathology Daily Session Note  Patient Details  Name: Renee [name] MRN: [id number] Date of Birth: [birth year]  Today's Date: 09/28/2019 SLP Individual Time: 1400-1425 SLP Individual Time Calculation (min): 25 min  Short Term Goals: Week 1: SLP Short Term Goal 1 (Week 1): STGs=LTGs due to ELOS  Skilled Therapeutic Interventions: Skilled treatment session focused on completion of family education with the patient and her children. All were educated on patient's current cognitive function and strategies to utilize at home to maximize recall and carryover of functional information and overall safety. All verbalized understanding and handouts were also given to reinforce information. Patient left supine in bed with family present. Continue with current plan of care.      Pain No/Denies Pain   Therapy/Group: Individual Therapy  Cody Albus 09/28/2019, 2:55 PM

## 2019-09-28 NOTE — Progress Notes (Signed)
Physical Therapy Session Note  Patient Details  Name: Renee [name] MRN: [id number] Date of Birth: Feb 09, 1946  Today's Date: 09/28/2019 PT Individual Time: 1445-1540 PT Individual Time Calculation (min): 55 min    Short Term Goals: Week 3:  PT Short Term Goal 1 (Week 3): = to LTGs based on ELOS  Skilled Therapeutic Interventions/Progress Updates:    Pt in the stedy with NT upon PT arrival, agreeable to therapy tx and denies pain. Pt going to the bathroom, therapist assisted with stedy transfer to the toilet, sit<>stands within stedy with mod assist, transferred to toilet, mod assist for standing balance while second helper assisted with pericare and clothing management. Pt transported out of bathroom with stedy. Pt's family present this session for continued hands on family training (son, daughter and son in law). Pt's son performed stedy transfer with patient to the w/c with min cues for techniques and application of breaks, set up. Pt's family then performed multiple stedy transfers this session using each other as "patients" for blocked practice with therapist providing cues throughout for techniques and education on set up. Pt's family then performed blocked practice of sqaut pivot transfers on each other, towards the left, simulating at home set up to/from hospital bed, therapist providing education on techniques including head/hips relationship. Pt's son performed squat pivot transfer with patient to the bed with mod assist and assisted her to supine max assist. Pt's family able to help her scoot up in bed. Pt left supine in bed with needs in reach and bed alarm set, all questions answered.   Therapy Documentation Precautions:  Precautions Precautions: Fall Precaution Comments: Significant weakness at RLE, impaired R UE proprioception Restrictions Weight Bearing Restrictions: No    Therapy/Group: Individual Therapy  Netta Corrigan, PT, DPT, CSRS 09/28/2019, 12:14 PM

## 2019-09-28 NOTE — Progress Notes (Addendum)
Looks like Ms. Woode had a relatively quiet weekend.  She does state that she is getting little bit more weak on the left side.  Surprisingly, her immunoglobulin levels are not that bad.  Her IgG level is 937 mg/dL.  This is a lot better than I would have thought.  As such, I am not sure that IVIG infusion will help.  I still think that trying to treat the CLL would be reasonable.  Again, I cannot tell her if treating CLL is going to make a difference with respect to this are PML progression.  I do not think that we would worsen the PML by treating the CLL.  She is trying to be transferred over to [facility]  I suppose this by staffing issues at [facility] that are preventing her from being transferred.  I thought that we could put a Port-A-Cath into her.  This would make doing treatment a little bit easier.  There are no labs back yet today.  Her vital signs all look pretty stable.  Temperature is 97.9.  Pulse 58.  Blood pressure 101/57.  Her lungs are clear.  Cardiac exam regular rate and rhythm.  Abdomen is soft.  There is no obvious palpable splenomegaly.  Her spleen tip might be at the left costal margin.  Extremities does show the weakness over on the right side.  We will have to see about when she can be moved over to [facility].  The bendamustine is only going to be 2 days.  Each day should be no longer than 30 minutes or so.  I know that she is getting fantastic care from everybody over at rehab.  Lattie Haw, MD  1 Mikeal Hawthorne 12:24  ADDENDUM: I spoke with Ms. Cruickshank primary oncologist updated [facility].  Dr. Dannielle Burn was incredibly helpful.  He has been in contact with a couple experts with PML.  They both said that even though treatment for CLL is possible, there is no improvement in the PML for the patients.  They both feel that Ms. Peddle has a very poor prognosis and that treating her might potentially exacerbate the CLL.  This is quite disappointing to me.   She is so nice.  We just would like to help her as much as possible.  However, we do not not want to do any harm.  We will cancel the Port-A-Cath placement.  She will not be moved over to [facility].  I spoke to her daughter, [name], by phone.  I explained to her the situation.  She is very understanding.  She has a very good relationship with Dr. Dannielle Burn.  She realizes that no treatment will be instituted.  As such, we want to get hospice of [facility] involved.  She is planning on being discharged to home on Wednesday.  We will make sure that hospice will see her.  I just know that this situation is incredibly rare.  She has never been treated for the CLL but yet has this PML secondary to what is obvious profound immunosuppression.  Since her IgG levels are okay, she does not need immunoglobulin.  Again, her daughter is very nice.  Her daughter is a Marine scientist.  Her daughter certainly appreciates all the help that was given to her mom.  I know that a lot of people worked very hard to try to do the right thing for Ms. Benjie Karvonen.  Lattie Haw, MD  1 Collier Salina 5:10

## 2019-09-28 NOTE — Progress Notes (Signed)
    [facility] for Infectious Disease   Reason for visit: Follow up on PML  Interval History: note reviewed this afternoon.  No treatment for the CLL to be pursued.  Going home with hospice.    Physical Exam: Constitutional:  Vitals:   09/27/19 2029 09/28/19 0403  BP: (!) 91/52 (!) 101/57  Pulse: 70 (!) 58  Resp: 18 18  Temp: 98.2 F (36.8 C) 97.9 F (36.6 C)  SpO2: 98% 99%   patient appears in NAD   Review of Systems: Gastrointestinal: negative for diarrhea  Lab Results  Component Value Date   WBC 89.7 (HH) 09/28/2019   HGB 11.0 (L) 09/28/2019   HCT 38.4 09/28/2019   MCV 88.9 09/28/2019   PLT 193 09/28/2019    Lab Results  Component Value Date   CREATININE 0.90 09/28/2019   BUN 20 09/28/2019   NA 139 09/28/2019   K 4.6 09/28/2019   CL 102 09/28/2019   CO2 26 09/28/2019    Lab Results  Component Value Date   ALT 16 09/10/2019   AST 17 09/10/2019   ALKPHOS 66 09/10/2019     Microbiology: No results found for this or any previous visit (from the past 240 hour(s)).  Impression/Plan:  1. PML - no treatment options available without a reversible cause.  CD4 and CD8 still pending but would not change the prognosis as there is no treatment for idiopathic CD4 or CD8 lymphopenia.   HIV is negative.   No for hospice and I agree that treatment of CLL is not indicated and potentially can worsen the disease process.

## 2019-09-28 NOTE — Progress Notes (Signed)
Occupational Therapy Session Note  Patient Details  Name: Renee [name] MRN: [id number] Date of Birth: 1946-03-08  Today's Date: 09/28/2019 OT Individual Time: 6203-5597 OT Individual Time Calculation (min): 60 min    Short Term Goals: Week 1:  OT Short Term Goal 1 (Week 1): Pt will don shirt with (S) OT Short Term Goal 1 - Progress (Week 1): Met OT Short Term Goal 2 (Week 1): Pt will demonstrate improved RUE proprioception by having 75% reaching accuracy OT Short Term Goal 2 - Progress (Week 1): Progressing toward goal OT Short Term Goal 3 (Week 1): Pt will don pants with min A OT Short Term Goal 3 - Progress (Week 1): Progressing toward goal OT Short Term Goal 4 (Week 1): Pt will transfer to [facility] with mod A OT Short Term Goal 4 - Progress (Week 1): Met Week 2:  OT Short Term Goal 1 (Week 2): Pt will complete squat pivot transfers to toilet with min A. OT Short Term Goal 1 - Progress (Week 2): Not progressing OT Short Term Goal 2 (Week 2): Pt will be able to self cleanse post toileting with CGA to support balance. OT Short Term Goal 2 - Progress (Week 2): Not progressing OT Short Term Goal 3 (Week 2): Pt will demonstrate improved dynamic sit balance to don pants over feet with min A. OT Short Term Goal 3 - Progress (Week 2): Not progressing OT Short Term Goal 4 (Week 2): Pt will demonstrate lateral leans with min A to pull pants up/down hips with mod A. OT Short Term Goal 4 - Progress (Week 2): Not progressing Week 3:  OT Short Term Goal 1 (Week 3): STGs = LTGs (some goals downgraded to mod -max A and some discontinued on 09/25/19)  Skilled Therapeutic Interventions/Progress Updates:    Pt received in bed and spoke about how they plan to transfer her to [name] for treatment today or tomorrow.  Pt agreeable to getting a shower.  Mod A bed  Mobility and then max A sit balance with LUE support and total A without LUE on EOB, stood with mod A to stedy and transferred to open seat padded  shower chair.  Pt needed to use LUE on shower chair handle to maintain balance while therapist assisted washing LB and L arm. With max A for balance, pt used L hand to wash R arm, chest, front perineal area.  Dried pt off and donned her clothing over feet  With shoes.  Pt c/o AFO feeling too tight with shoes on so left AFO off.  Stood to stedy but needed max A to maintain stand balance as therapist pulled clothing over her hips.  Transferred to EOB to don shirt with mod A and then moved into supine with max A. Adjusted pt with pillows to her preference.  Pt stated she was exhausted but did feel clean.  Unfortunately, today pt continues to demonstrate even more muscle strength loss in trunk as she needed significantly more A with her postural control.  Pt resting in bed with alarm set and all needs met.   Therapy Documentation Precautions:  Precautions Precautions: Fall Precaution Comments: Significant weakness at RLE, impaired R UE proprioception Restrictions Weight Bearing Restrictions: No  Pain: Pain Assessment Pain Score: 5  Pain Type: Chronic pain Pain Location: Scapula Pain Orientation: Right Pain Descriptors / Indicators: Aching Pain Onset: On-going Pain Intervention(s): (lidocane patch) ADL: ADL Eating: Set up Where Assessed-Eating: Bed level Grooming: Setup Where Assessed-Grooming: Bed level Upper Body  Bathing: Minimal assistance Where Assessed-Upper Body Bathing: Shower Lower Body Bathing: Maximal assistance Where Assessed-Lower Body Bathing: Shower Upper Body Dressing: Moderate assistance Where Assessed-Upper Body Dressing: Edge of bed Lower Body Dressing: Dependent Where Assessed-Lower Body Dressing: Chair Toileting: Maximal assistance Where Assessed-Toileting: Glass blower/designer: Moderate assistance Toilet Transfer Method: Other (comment)(stedy) Toilet Transfer Equipment: Grab bars, Raised toilet seat Social research officer, government Method: Other (comment)(stedy  lift) Youth worker: Other (comment)(open seat padded chair)   Therapy/Group: Individual Therapy  [place] 09/28/2019, 9:43 AM

## 2019-09-28 NOTE — Care Management (Signed)
   The overall goal for the admission was met for: No; secondary to medical issues; new diagnosis.  Discharge location: Home with daughter; Hospice at Home declined for now; palliative following.  Length of Stay: 21 days with the discharge set for 09/30/19  Discharge activity level: Maximum assist  Home/community participation: HH referral with transition to Hospice care  Services provided included: MD, RD, PT, OT, SLP, RN, CM, Pharmacy, Neuropsych and SW  Financial Services: Other: Blue Medicare, Medicare Advantage; [employer]  Follow-up services arranged: Home Health: PT, OT, SLP from [facility], DME: Hospital Bed, drop arm commode, walker from [facility], Other: Hospice at Home referral and Patient/Family request agency HH: None, DME: [facility]  Comments (or additional information): [facility] [phone] [facility] [phone]  Patient/Family verbalized understanding of follow-up arrangements: Yes  Individual responsible for coordination of the follow-up plan: Daughter; [name] [name] (M) [phone]  Confirmed correct DME delivered: Added hoyer lift and overbed table with wheels 09/29/19 Transport to home arranged 09/30/19 with family request [facility] transport;not in network.  ,  B 

## 2019-09-28 NOTE — Progress Notes (Signed)
Occupational Therapy Note  Patient Details  Name: Renee [name] MRN: [id number] Date of Birth: [birth year]  Today's Date: 09/28/2019 OT Individual Time: 1300-1358 OT Individual Time Calculation (min): 58 min   C/o L low back pain, has lidocane patch on   FAMILY EDUCATION:   Pt seen this session for family education with her daughter, son in law, and son.  Spent time discussing her disease progression and the lost of function and strength over the last few weeks and that this session was focused on helping the family learn how to manage the patient to keep her and them physically safe as pt will likely continue to lose strength.   Discussed, demonstrated and return demonstration: -RUE positioning, management to avoid injury, and resting hand splint positioning -how to complete bathing and dressing from bed using hemidressing techniques, crossing legs and bridging -how to facilitate bridging, rolling, RUE management for bed mobilty -safe body mechanics to help her move from sidelying >< sit -sitting EOB with max A and how to help her adjust hips if needed by leaning pt side to side  -positioning in bed with 2 people using bed pad  Pt getting fatigued so demonstrated how to do a 1 person management of pt in stedy for clothing management.  Had a rehab tech model and demonstrate a R lean in standing as this therapist showed them how to position their body to support her balance as they managed clothing.  2 person assist is always advised, but in case that is not always an option at home, they have a strategy to use.   Reviewed that pt has worked hard and put in good effort but her body strength continues to decline with her disease progression.    Family understood and stated that they did not have any more questions at this time.     [place] 09/28/2019, 12:35 PM

## 2019-09-28 NOTE — Progress Notes (Signed)
**Renee [name] De-Identified via Obfuscation** Renee Renee [name]  Subjective/Complaints: Appreciate palliative care and [facility]ene[name].  Still constipated  Pt has therapy scheduled for this afternoon  ROS: Patient denies , nausea, vomiting, diarrhea, cough, shortness of breath or chest pain,  headache, or mood change.   Objective: Vital Signs: Blood pressure (!) 101/57, pulse (!) 58, temperature 97.9 F (36.6 C), temperature source Oral, resp. rate 18, weight 78.9 kg, SpO2 99 %. No results found. Recent Labs    09/28/19 0732  WBC 89.7*  HGB 11.0*  HCT 38.4  PLT 193   Recent Labs    09/28/19 0732  NA 139  K 4.6  CL 102  CO2 26  GLUCOSE 90  BUN 20  CREATININE 0.90  CALCIUM 8.8*    Physical Exam: BP (!) 101/57 (BP Location: Left Arm)   Pulse (!) 58   Temp 97.9 F (36.6 C) (Oral)   Resp 18   Wt 78.9 kg   SpO2 99%   BMI 22.95 kg/m  Constitutional: No distress . Vital signs reviewed. HEENT: EOMI, oral membranes moist Neck: supple Cardiovascular: RRR without murmur. No JVD    Respiratory: CTA Bilaterally without wheezes or rales. Normal effort    GI: BS +, non-tender, non-distended  Skin: Warm and dry.  Intact. Psych: Normal mood.  Normal behavior. Musc: No edema in extremities.  No tenderness in extremities. Neuro: Alert Motor:  RUE: Shoulder abduction, elbow flexion/extension 2/5, handgrip 2 to 2 -/5, emerging flexor tone RUE---wearing WHO Left upper extremity: 5/5 proximal distal, unchanged RLE: HF, KE 1/5, ADF 0/5, unchanged--stable Left lower extremity: Hip flexion, knee extension 4+/5, ankle dorsiflexion 5/5  Assessment/Plan: 1. 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  Physiatrist is providing close team supervision and 24 hour management of active medical problems listed below.  Physiatrist and rehab team continue to assess barriers to discharge/monitor patient  progress toward functional and medical goals  Care Tool:  Bathing  Bathing activity did not occur: Refused Body parts bathed by patient: Right arm, Chest, Abdomen, Right upper leg, Left upper leg, Front perineal area, Face   Body parts bathed by helper: Right lower leg, Left lower leg, Left arm, Buttocks Body parts n/a: Front perineal area, Buttocks   Bathing assist Assist Level: Moderate Assistance - Patient 50 - 74%     Upper Body Dressing/Undressing Upper body dressing   What is the patient wearing?: Pull over shirt    Upper body assist Assist Level: Moderate Assistance - Patient 50 - 74%    Lower Body Dressing/Undressing Lower body dressing      What is the patient wearing?: Underwear/pull up, Pants     Lower body assist Assist for lower body dressing: Total Assistance - Patient < 25%(sit to stand with stedy lift)     Toileting Toileting    Toileting assist Assist for toileting: Moderate Assistance - Patient 50 - 74%     Transfers Chair/bed transfer  Transfers assist     Chair/bed transfer assist level: Moderate Assistance - Patient 50 - 74%     Locomotion Ambulation   Ambulation assist      Assist level: 2 helpers Assistive device: Parallel bars Max distance: 3 ft   Walk 10 feet activity   Assist     Assist level: Total Assistance - Patient < 25% Assistive device: Lite Gait   Walk 50 feet activity   Assist Walk 50 feet with  2 turns activity did not occur: Safety/medical concerns  Assist level: Total Assistance - Patient < 25% Assistive device: Lite Gait    Walk 150 feet activity   Assist Walk 150 feet activity did not occur: Safety/medical concerns         Walk 10 feet on uneven surface  activity   Assist Walk 10 feet on uneven surfaces activity did not occur: Safety/medical concerns         Wheelchair     Assist Will patient use wheelchair at discharge?: Yes Type of Wheelchair: Manual    Wheelchair assist  level: Minimal Assistance - Patient > 75% Max wheelchair distance: 156ft    Wheelchair 50 feet with 2 turns activity    Assist        Assist Level: Minimal Assistance - Patient > 75%   Wheelchair 150 feet activity     Assist Wheelchair 150 feet activity did not occur: Safety/medical concerns   Assist Level: Minimal Assistance - Patient > 75%      Medical Problem List and Plan:  1. Impaired ADLs/mobility and function secondary to PML with R hemiparesis and spasticity   Continue CIR   Pt working with therapies.  Palliative care following patient and providing guidance regarding goals of care, etc. APPRECIATE their assistance! Plan is for IVIG treatments at [name]  Neurology has seen patient again-appreciate recs    Neuro/Renee to discuss with hematology   Functional goals as well as modification of discharge date given new findings on MRI/diagnosis with poor prognosis have been reviewed with rehab team 2. Antithrombotics:   -DVT/anticoagulation: Pharmaceutical: Lovenox   -antiplatelet therapy: n/a  3. Chronic pudendal pain/Pain Management: Cymbalta  Continue methadone bid, afternoon dose added, changed dosing to 10 mg 3 times daily  Monitor with increased mobility  ?Rash allergy to Lyrica-tolerating retrial, increased on 1/15  Baclofen 5 TID started on 1/8, increased on 1/13  Added anusol supp HCT BID  Cont Tucks pad to anal area QID  Continue ice  Improved pain control on Lyrica 50mg  BID, may titrate as needed   -consider further baclofen titration    4. Mood: LCSW to follow for evaluation and support.   Klonopin 0.5 twice daily for anxiety   Appears relatively controlled on 1/25  -antipsychotic agents: N/A  5. Neuropsych: This patient is ? fully capable of making decisions on her own behalf.  6. Skin/Wound Care: Routine pressure relief measures.  7. 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 Improved.   Creatinine 1.02 on  1/18  Encourage fluids  Labs tomorrow 9. Hypothyroid: Resume supplement.  10. H/o depression: On Lutuda, cymbalta and Clonazepam  11.  PML: Follow up MRI brain ordered, please see above.  Decadron being slowly weaned per neurology 12. Steroid induced hyperglycemia: Hgb A1c-5.4.   Sugars are under control 13. OIC/?IBS: On multiple medications:  Laculose changed to PRN per patient preference  Senna decreased on 1/13  Linzess restarted per pt/family preference   Stable with meds on 1/23--bm  14. Breast cancer in remission/CLL being observed:   WBCs 84.3 on 1/21.   Discussed with Neuro/[facility] and Heme/Renee.  Per Heme/Renee cont to monitor and no cause for concern unless acute spike.  Afebrile  Cont to monitor 14. Iron deficiently anemia: Resolved  Added iron supplement   Hb 10.7 on 1/21  Cont to monitor 15. Spasticity of R ankle, RUE  See #3  [facility]  1/23 ordered [name]  16.  Neurogenic bladder  Continue  ditropan qhs 17.  Right scapular pain  X-ray reviewed, unremarkable for fracture  Voltaren gel ordered on 1/13  Improved 18.  Hypotension  Asymptomatic at present  Stable on 1/23    LOS: 19 days A FACE TO [facility] R[name] 09/28/2019, 10:21 AM

## 2019-09-28 NOTE — Discharge Summary (Addendum)
Physician Discharge Summary  Patient ID: Renee [name] MRN: [id number] DOB/AGE: Nov 22, 1945 74 y.o.  Admit date: 09/09/2019 Discharge date: 09/30/2019  Discharge Diagnoses:  Principal Problem:   PML (progressive multifocal leukoencephalopathy) ([place]) Active Problems:   CLL (chronic lymphocytic leukemia) ([facility])   Mass of parietal lobe   Spastic hemiparesis ([facility])   Iron deficiency anemia   Drug induced constipation   Prolonged Q-T interval on ECG   Malnutrition of moderate degree   CKD (chronic kidney disease), stage II   Neurogenic bladder   Pain of right scapula   Neuropathic pain   Entrapment of right ulnar nerve   Anxiety state   Pain in left hip   Discharged Condition: stable   Significant Diagnostic Studies: DG Scapula Right  Result Date: 09/15/2019 CLINICAL DATA:  Patient had multiple falls about 12 days ago onto right side, still having right shoulder pain and stiffness. EXAM: RIGHT SCAPULA - 2+ VIEWS COMPARISON:  None. FINDINGS: Glenohumeral joint is intact. No evidence of scapular fracture or humeral fracture. The acromioclavicular joint is intact. IMPRESSION: No fracture or dislocation. Electronically Signed   By: Suzy Bouchard M.D.   On: 09/15/2019 11:15    MR BRAIN W WO CONTRAST  Addendum Date: 09/24/2019   ADDENDUM REPORT: 09/24/2019 11:52 ADDENDUM: Additional clinical information provided by Dr. 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 PML therefore favored. Electronically Signed   By: Macy Mis M.D.   On: 09/24/2019 11:52   Result Date: 09/24/2019 CLINICAL DATA:  Parietal tumor, follow-up EXAM: MRI HEAD WITHOUT AND WITH CONTRAST TECHNIQUE: Multiplanar, multiecho pulse sequences of the brain and surrounding structures were obtained without and with intravenous contrast. CONTRAST:  29m GADAVIST GADOBUTROL 1  MMOL/ML IV SOLN COMPARISON:  09/04/2018 FINDINGS: Brain: Abnormal T2 hyperintensity is again identified primarily within the left parietal lobe with some extension into the posterior frontal lobe. Extent has increased. There is notable involvement of the postcentral gyrus and paracentral lobule as well as the medial postcentral gyrus. This extends to the superior margin of the posterior body of the left lateral ventricle. Additionally, there is slightly discontiguous T2 hyperintensity within the left precentral gyrus hand motor region and along the periventricular white matter (best seen on diffusion series 5, images 76 and 90). Associated mass effect is mild. There is no abnormal enhancement. There is a small area of diffusion hyperintensity in the contralateral paracentral right frontoparietal region (series 5, image 86). This is new but not clearly visualized on other sequences. There is no evidence of hemorrhage. No hydrocephalus or extra-axial fluid collection. Vascular: Major vessel flow voids at the skull base are preserved. Skull and upper cervical spine: Normal marrow signal is preserved. Sinuses/Orbits: Paranasal sinuses are aerated. Orbits are unremarkable. Other: Sella is unremarkable.  Mastoid air cells are clear. IMPRESSION: Increase in extent of nonenhancing abnormal signal centered within the left parietal lobe as detailed above. There are new foci of discontiguous abnormal signal for which attention on follow-up is recommended, noting that both infiltrating tumor and ischemia are in the differential. 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 Close follow-up is recommended given change over short interval. Electronically Signed: By: PMacy MisM.D. On: 09/23/2019 15:41    Labs:  Basic Metabolic Panel: BMP Latest Ref Rng & Units 09/28/2019 09/21/2019 09/18/2019  Glucose 70 - 99 mg/dL 90 102(H) 108(H)  BUN 8 - 23  mg/dL '20 18 20  '$ Creatinine 0.44 - 1.00 mg/dL 0.90 1.02(H) 0.98   Sodium 135 - 145 mmol/L 139 139 137  Potassium 3.5 - 5.1 mmol/L 4.6 4.3 4.2  Chloride 98 - 111 mmol/L 102 106 105  CO2 22 - 32 mmol/L '26 24 23  '$ Calcium 8.9 - 10.3 mg/dL 8.8(L) 8.6(L) 8.9    CBC: CBC Latest Ref Rng & Units 09/28/2019 09/24/2019 09/21/2019  WBC 4.0 - 10.5 K/uL 89.7(HH) 84.3(HH) 72.4(HH)  Hemoglobin 12.0 - 15.0 g/dL 11.0(L) 10.7(L) 10.3(L)  Hematocrit 36.0 - 46.0 % 38.4 35.6(L) 34.3(L)  Platelets 150 - 400 K/uL 193 197 191    CBG: Recent Labs  Lab 09/29/19 1206 09/29/19 1640 09/29/19 2058 09/30/19 0609 09/30/19 1149  GLUCAP 102* 105* 114* 70 84    Brief HPI:   Renee [name] is a 74 y.o. female with history of CKD, HTN, CLL, breast cancer, pudendal neuralgia with chronic pain, anxiety disorder who was admitted on 09/04/2019 with 2 to 3 weeks history of multiple falls, progressive weakness affecting FMC of right hand and right foot drop.  MRI brain done showing 2 cm cystic lesion with vasogenic edema and question of tumor versus cerebritis.  She was transferred to [facility] for further work-up and evaluated by neurology.  She was started on Decadron and and MRI brain with contrast showed 2 cm subcortical white matter lesion without enhancement and LP done with CSF sent for flow cytometry and JC virus. Dr. Mickeal Skinner consulted for input and questioned non-neoplastic etiology and recommended repeating MRI brain in one month as well as aggressive rehab to right sided weakness which was affecting mobility and ADLs. Therapy evaluations completed and CIR recommended due to functional decline.    Hospital Course: Renee [name] was admitted to rehab 09/09/2019 for inpatient therapies to consist of PT, ST and OT at least three hours five days a week. Past admission physiatrist, therapy team and rehab RN have worked together to provide customized collaborative inpatient rehab.  She was delivered to Decadron twice daily at admission per recommendations.  She continued to have  limitations due to pain and spasticity.  EKG check showed QT interval to be stable therefore methadone was increased back to 10 mg 3 times daily.  Baclofen was added and has been titrated upwards.  Lyrica was also added and titrated to 50 mg twice daily without side effects.  Follow-up check of electrolytes shows renal status as well as potassium levels to be stable.  His CBC at admission showed significant jump in WBC up to 116.  Heme-onc was consulted for input and recommended monitoring as it was felt to be due to Decadron and all other cell lines were stable.  Her white count has trended back down to 90,000.   Blood pressures have been soft and patient has been asymptomatic.  She reported right scapular pain affecting activity as well as fall PTA.   X-rays done were negative for fracture.  Lidocaine patches were added to both shoulders to help with symptom relief.  She has chronic OIC and her bowel program has been adjusted multiple times during the stay.  Patient and family educated on using suppository or enema additionally every 2 to 3 days as needed.  Speech therapy was added on 1/19 due to reports of problems with memory.  She was noted to have mild cognitive impairment and has focus on strategies to improve memory.  Despite intensive rehab, patient was noted to have a steady decline with increase  in right sided weakness as well as decline in activity. MRI of brain was repeated on 01/20 revealing increase in extent of non-enhancing signal lobe in left parietal lobe as well as new foci of discontinuous abnormal signal in paracentral right parietal region--felt to favor PML as LP was positive for JC virus.    Heme-onc, ID and neurology were consulted for input and with question of using IVIG v/s treatment of CLL. Labs for HIV and other inflammatory markers were negative and IgG levels noted to be normal. Chemotherapy would likely hasten fatality of disease per neurology.  Palliative care was consulted to  help determine GOC and patient/daughter has elected to go home with [facility] and transition to  Hospice in the near future.  She was noted to have developed some erythema with minimal edema right foot on 01/25-- question due to pressure from her AFO.  She was started on Keflex X 7 days empirically due to concerns of cellulitis.  She will continue to receive follow up [place] [place] and [facility] by [facility] after discharge.    Rehab Course: During patient's stay in rehab weekly team conferences were held to monitor patient's progress, set goals and discuss barriers to discharge. At admission, patient required max assist with mobility and with basic self care tasks.  She has had rapid progressive decline in right sided strength with decline in functional status and therefore has not met any of her goals.  She requires mod to max assist to stand but has difficulty maintaining upright posture. She requires mod assist for squat pivot or STEDY for transfers.   She require mod to max assist to propel her wheelchair. She requires supervision for recall and for complex problem solving. Family education was completed regarding all aspects of safety, mobility as well as education compensatory strategies with anticipated further decline.    Disposition: Home   Diet: Regular  Special Instructions: 1. Adjust bowel program with enemas every 2 days as needed. 2. Offer supplements during the day. 3. Elevated RLE when seated or in bed.    Discharge Instructions     Ambulatory referral to Physical Medicine Rehab   Complete by: As directed    1-2 weeks TC appt      Allergies as of 09/30/2019       Reactions   Bupropion Other (See Comments)   Fidgety    Clonidine Derivatives Other (See Comments)   Codeine Nausea Only   Diclofenac-misoprostol Other (See Comments)   Fidgety    Olanzapine Other (See Comments)   Fidgety    Pregabalin Other (See Comments), Rash   Valdecoxib Other (See Comments)    Zonisamide Other (See Comments)   Fidgety         Medication List     STOP taking these medications    Calcium High Potency/Vitamin D 600-200 MG-UNIT Tabs Generic drug: Calcium Carbonate-Vitamin D   DULoxetine 60 MG capsule Commonly known as: CYMBALTA   KRILL OIL PO       TAKE these medications    acetaminophen 325 MG tablet Commonly known as: TYLENOL Take 2 tablets (650 mg total) by mouth 3 (three) times daily.   baclofen 10 MG tablet Commonly known as: LIORESAL Take 1 tablet (10 mg total) by mouth 3 (three) times daily.   bisacodyl 10 MG suppository Commonly known as: DULCOLAX Place 1 suppository (10 mg total) rectally daily as needed for moderate constipation.   cephALEXin 250 MG capsule Commonly known as: KEFLEX Take 1 capsule (  250 mg total) by mouth every 8 (eight) hours.   clonazePAM 0.5 MG tablet--Rx# 60 pills Commonly known as: KLONOPIN Take 1 tablet (0.5 mg total) by mouth 2 (two) times daily.   dexamethasone 1 MG tablet Commonly known as: DECADRON Take 2 pills daily with breakfast thorough 01/29. Then starting 01/30-decrease to one pill daily till gone. What changed:  medication strength how much to take how to take this when to take this additional instructions   diclofenac Sodium 1 % Gel Commonly known as: VOLTAREN Apply 2 g topically 4 (four) times daily. To hip   Enulose 10 GM/15ML Soln Generic drug: lactulose (encephalopathy) TAKE 15 MLS (10 G TOTAL) BY MOUTH TWO (2) (TWO) TIMES DAILY AS NEEDED FOR MILD CONSTIPATION OR SEVERE CONSTIPATION.   hydrocortisone 25 MG suppository Commonly known as: ANUSOL-HC Place 1 suppository (25 mg total) rectally 2 (two) times daily.   lactulose 10 GM/15ML solution Commonly known as: CHRONULAC Take 15 mLs (10 g total) by mouth 4 (four) times daily as needed for mild constipation or severe constipation.   levothyroxine 100 MCG tablet Commonly known as: SYNTHROID Take 100 mcg by mouth daily before  breakfast.   lidocaine 5 % Commonly known as: LIDODERM Place 2 patches onto the skin daily. Apply at 7 am and remove at 7 pm daily. Notes to patient: Insurance usually does not cover this--can be purchased over the counter   linaclotide 290 MCG Caps capsule Commonly known as: Linzess Take 1 capsule (290 mcg total) by mouth daily before breakfast. What changed: Another medication with the same name was removed. Continue taking this medication, and follow the directions you see here.   lurasidone 40 MG Tabs tablet Commonly known as: LATUDA Take 1 tablet (40 mg total) by mouth daily with breakfast.   Melatonin 3 MG Tabs Take 1 tablet (3 mg total) by mouth at bedtime.   methadone 10 MG tablet--Rx# 90 pills Commonly known as: DOLOPHINE Take 1 tablet (10 mg total) by mouth every 8 (eight) hours. What changed: See the new instructions.   pantoprazole 40 MG tablet Commonly known as: PROTONIX Take 1 tablet (40 mg total) by mouth 2 (two) times daily.   polyethylene glycol 17 g packet Commonly known as: MIRALAX / GLYCOLAX Take 17 g by mouth 2 (two) times daily. What changed: when to take this   pregabalin 50 MG capsule--Rx# 60 pills Commonly known as: LYRICA Take 1 capsule (50 mg total) by mouth 2 (two) times daily.   rosuvastatin 20 MG tablet Commonly known as: CRESTOR Take 20 mg by mouth daily.   senna-docusate 8.6-50 MG tablet Commonly known as: Senokot-S Take 1 tablet by mouth 2 (two) times daily.   witch hazel-glycerin pad Commonly known as: TUCKS Apply topically 4 (four) times daily -  before meals and at bedtime. Notes to patient: Over the counter       Follow-up Information     Jamse Arn, MD Follow up.   Specialty: Physical Medicine and Rehabilitation Why: as needed Contact information: [address] [place] [address] [phone]         Derek Jack, MD Follow up.   Specialty: Hematology Contact information: [place] [address] [phone]         Ventura Sellers, MD Follow up.   Specialties: Psychiatry, Neurology, Oncology Contact information: [place] [address] [place], [place] Call.  Specialty: Nurse Practitioner Why: as needed Contact information: [place] [address] [phone]         Chana Bode, [place]. Call.   Specialty: Pain Medicine Why: for follow up  Contact information: [place] [address] [phone]            Signed: Bary Leriche 09/30/2019, 5:41 PM Patient was seen, face-face, and physical exam performed by me on day of discharge, greater than 30 minutes of total time spent. Please see progress note from day of discharge as well.  Delice Lesch, MD, ABPMR

## 2019-09-28 NOTE — Progress Notes (Signed)
Palliative Medicine Inpatient Follow Up Note   HPI: Renee [name] is a 74 year old female with history of CKD, HTN, CLL, breast cancer s/p lumpectomy with XRT, pudendal neuralgia, anxiety disorder who was admitted on 09/04/19 with2 to 3-week history of multiple falls with progressive weakness affecting FMC of right hand and right foot drop. MRI brain done showing 2 cm cystic lesion with vasogenic edema and question of tumor versus cerebritis. She was transferred to [facility] for further work-up and evaluated by neurology. She was started on Decadron and MRI brain with contrast showed 2 cm subcortical white matter lesion in parietal lobe without enhancement--cerebritis or subacute infarct felt to be unlikely. LP done showing elevated glucose with 1 WBC and 89 RBC. CSF sent for flow cytometry and JC virus. Dr. Malen Gauze recommends biopsy with PET scan and Dr. Mickeal Skinner consulted for input and felt that CNS involvement and CLL was very uncommon.He question nonneoplastic etiology such as vascular inflammatory process and recommend repeating contrast enhanced MRI in 1 month as well as decreasing Decadron to 4 mg daily at discharge. Patient to be discussed at tumor board on 1/11.Aggressive rehab recommended due to ongoing right-sided weakness affecting mobility and ADLs. CIR recommended due to functional decline.  Today's Discussion (09/28/2019): Chart reviewed. Spoke to patient and her son, daughter, [name] and her husband, Barbaraann Rondo.  They were present again today for additional training with physical therapy. Shared with them that I had been following the recommendations of the specialty teams. No longer will patient pursue portacath placement and treatment for CLL. It was strongly advised that she consider discharge home with hospice. This is what [name] and her family have opted to do.   Discussed with patient the importance of continued conversation with family and their medical providers regarding  overall plan of care and treatment options, ensuring decisions are within the context of the patients values and GOCs.  Questions and concerns addressed   Palliative care will continue to follow when transfer occurs.   Vital Signs Vitals:   09/27/19 2029 09/28/19 0403  BP: (!) 91/52 (!) 101/57  Pulse: 70 (!) 58  Resp: 18 18  Temp: 98.2 F (36.8 C) 97.9 F (36.6 C)  SpO2: 98% 99%    Intake/Output Summary (Last 24 hours) at 09/28/2019 1506 Last data filed at 09/28/2019 0800 Gross per 24 hour  Intake 720 ml  Output --  Net 720 ml   Last Weight  Most recent update: 09/14/2019  4:25 AM   Weight  78.9 kg (173 lb 15.1 oz)           Vitals and nursing note reviewed.  HENT:     Head: Normocephalic.     Nose: Nose normal.     Mouth/Throat:     Mouth: Mucous membranes are moist.  Eyes:     Pupils: Pupils are equal, round, and reactive to light.  Cardiovascular:     Rate and Rhythm: Normal rate.  Pulmonary:     Effort: Pulmonary effort is normal.  Abdominal:     General: Abdomen is flat.  Musculoskeletal:        General: Normal range of motion.     Cervical back: Normal range of motion.  Skin:    General: Skin is warm.  Neurological:     General: Inability to use right side    Mental Status: She is alert.   SUMMARY OF RECOMMENDATIONS   DNAR/DNI/No feeding tubes Hospcie consult, plan to now discharge home on hospice  Code Status/Advance Care Planning:  DNR  Symptom Management:  Goals of Care: - Patient is DNAR/DNI                 - A TOC referral has been placed for Palliative OP  Right Shoulder Pain: Left Hip Pain:                 - Tylenol 650mg  PO TID                 - Lidocaine Patch Qday, on 12 hours/off 12 hours                 - Voltaren Gel  Chronic pudendal paint:                 - Lyrica 50mg  PO BID                 - Methadone 10mg  PO TID                 - Baclofen 5mg  PO TID                 - Anusol suppository BID                  - TUCKS QID  Constipation:                 - Linzess QDay - Lactulose increase to TID  - Senna BID                 - Bisacodyl suppository 10mg  PR QDay   Muscular Weakness:                 - OOB during the day - PT - OT  Xerostomia: - Good oral care QShift - Encourage liquid intake  Anxiety:                 - Klonopin 0.5mg  PO BID  Delirium Precautions: - Get up during the day - Encourage a familiar face to remain present throughout the day - Keep blinds open and lights on during daylight hours - Minimize the use of opioids/benzodiazepines  Emotional Support:                 - Therapeutic Listening  Spiritual: - Chaplain consult  Time Spent: 15 Greater than 50% of the time was spent in counseling and coordination of care ______________________________________________________________________________________  Team Team Cell Phone: [phone] Please utilize secure chat with additional questions, if there is no response within 30 minutes please call the above phone number  Palliative Medicine Team providers are available by phone from 7am to 7pm daily and can be reached through the team cell phone.  Should this patient require assistance outside of these hours, please call the patient's attending physician.

## 2019-09-29 ENCOUNTER — Inpatient Hospital Stay (HOSPITAL_COMMUNITY): Payer: Medicare Other | Admitting: Speech Pathology

## 2019-09-29 ENCOUNTER — Inpatient Hospital Stay (HOSPITAL_COMMUNITY): Payer: Medicare Other | Admitting: Physical Therapy

## 2019-09-29 ENCOUNTER — Inpatient Hospital Stay (HOSPITAL_COMMUNITY): Payer: Medicare Other | Admitting: Occupational Therapy

## 2019-09-29 LAB — CD4/CD8 (T-HELPER/T-SUPPRESSOR CELL)

## 2019-09-29 LAB — CBC
HCT: 38.4 % (ref 36.0–46.0)
Hemoglobin: 11 g/dL — ABNORMAL LOW (ref 12.0–15.0)
MCH: 25.5 pg — ABNORMAL LOW (ref 26.0–34.0)
MCHC: 28.6 g/dL — ABNORMAL LOW (ref 30.0–36.0)
MCV: 88.9 fL (ref 80.0–100.0)
Platelets: 193 10*3/uL (ref 150–400)
RBC: 4.32 MIL/uL (ref 3.87–5.11)
RDW: 16.8 % — ABNORMAL HIGH (ref 11.5–15.5)
WBC: 89.7 10*3/uL (ref 4.0–10.5)
nRBC: 0 % (ref 0.0–0.2)

## 2019-09-29 LAB — GLUCOSE, CAPILLARY
Glucose-Capillary: 64 mg/dL — ABNORMAL LOW (ref 70–99)
Glucose-Capillary: 79 mg/dL (ref 70–99)
Glucose-Capillary: 102 mg/dL — ABNORMAL HIGH (ref 70–99)
Glucose-Capillary: 105 mg/dL — ABNORMAL HIGH (ref 70–99)
Glucose-Capillary: 114 mg/dL — ABNORMAL HIGH (ref 70–99)

## 2019-09-29 NOTE — Progress Notes (Signed)
Speech Language Pathology Discharge Summary  Patient Details  Name: Renee [name] MRN: [id number] Date of Birth: 1946-02-15  Today's Date: 09/29/2019 SLP Individual Time: 0710-0750 SLP Individual Time Calculation (min): 40 min   Skilled Therapeutic Interventions:  Skilled treatment session focused on cognitive goals. SLP facilitated session by re-administering the Cognistat. Patient scored WFL on all subtests including short-term memory, judgement and problem solving in which she scored mild impairments during initial evaluation. Patient also demonstrated appropriate anticipatory awareness and realistic expectations in regards to discharge home. Patient left upright in bed with alarm on and all needs within reach.   Patient has met 4 of 4 long term goals.  Patient to discharge at overall Supervision level.   Reasons goals not met: N/A   Clinical Impression/Discharge Summary: Patient has made functional gains and has met 4 of 4 LTGs this admission. Currently, patient requires overall supervision level verbal cues for recall of functional information with use of compensatory strategies, anticipatory awareness and complex problem solving. Patient and family education is complete and patient will discharge home with 24 hour supervision from family. Patient would benefit from f/u SLP services to maximize her cognitive functioning and overall functional independence in order to reduce caregiver burden.   Care Partner:  Caregiver Able to Provide Assistance: Yes  Type of Caregiver Assistance: Physical;Cognitive  Recommendation:  24 hour supervision/assistance;Home Health SLP  Rationale for SLP Follow Up: Maximize cognitive function and independence;Reduce caregiver burden   Equipment: N/A   Reasons for discharge: Discharged from hospital;Treatment goals met   Patient/Family Agrees with Progress Made and Goals Achieved: Yes    Dillan Lunden 09/29/2019, 6:22 AM

## 2019-09-29 NOTE — Progress Notes (Signed)
[place] PHYSICAL MEDICINE & REHABILITATION PROGRESS NOTE  Subjective/Complaints: Patient seen sitting up in bed this morning.  She states she slept fairly overnight, but had some urinary frequency.  She was seen by several specialists yesterday, including hematology/oncology, neurology, infectious disease, palliative care.  Notes reviewed.  She has questions regarding discharge and medications.  ROS: Denies CP, SOB, N/V/D  Objective: Vital Signs: Blood pressure (!) 105/37, pulse 64, temperature 98.1 F (36.7 C), temperature source Oral, resp. rate 20, weight 78.9 kg, SpO2 99 %. No results found. Recent Labs    09/28/19 0732  WBC 89.7*  HGB 11.0*  HCT 38.4  PLT 193   Recent Labs    09/28/19 0732  NA 139  K 4.6  CL 102  CO2 26  GLUCOSE 90  BUN 20  CREATININE 0.90  CALCIUM 8.8*    Physical Exam: BP (!) 105/37 (BP Location: Left Arm)   Pulse 64   Temp 98.1 F (36.7 C) (Oral)   Resp 20   Wt 78.9 kg   SpO2 99%   BMI 22.95 kg/m  Constitutional: No distress . Vital signs reviewed. HENT: Normocephalic.  Atraumatic. Eyes: EOMI. No discharge. Cardiovascular: No JVD. Respiratory: Normal effort.  No stridor. GI: Non-distended. Skin: Warm and dry.  Intact. Psych: Normal mood.  Normal behavior. Musc: No edema in extremities.  No tenderness in extremities. Neuro: Alert Motor:  RUE: 0/5 proximal distal Left upper extremity: 5/5 proximal distal, unchanged RLE: 0/5 proximal to distal Left lower extremity: Hip flexion, knee extension 4+/5, ankle dorsiflexion 5/5  Assessment/Plan: 1. 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  Physiatrist is providing close team supervision and 24 hour management of active medical problems listed below.  Physiatrist and rehab team continue to assess barriers to discharge/monitor patient progress toward functional and medical goals  Care  Tool:  Bathing  Bathing activity did not occur: Refused Body parts bathed by patient: Right arm, Chest, Abdomen, Right upper leg, Left upper leg, Front perineal area, Face   Body parts bathed by helper: Right lower leg, Left lower leg, Left arm, Buttocks Body parts n/a: Front perineal area, Buttocks   Bathing assist Assist Level: Moderate Assistance - Patient 50 - 74%     Upper Body Dressing/Undressing Upper body dressing   What is the patient wearing?: Pull over shirt    Upper body assist Assist Level: Moderate Assistance - Patient 50 - 74%    Lower Body Dressing/Undressing Lower body dressing      What is the patient wearing?: Underwear/pull up, Pants     Lower body assist Assist for lower body dressing: Total Assistance - Patient < 25%(sit to stand with stedy lift)     Toileting Toileting    Toileting assist Assist for toileting: Moderate Assistance - Patient 50 - 74%     Transfers Chair/bed transfer  Transfers assist     Chair/bed transfer assist level: Moderate Assistance - Patient 50 - 74%     Locomotion Ambulation   Ambulation assist      Assist level: 2 helpers Assistive device: Parallel bars Max distance: 3 ft   Walk 10 feet activity   Assist     Assist level: Total Assistance - Patient < 25% Assistive device: Lite Gait   Walk 50 feet activity   Assist Walk 50 feet with 2 turns activity did not occur: Safety/medical concerns  Assist level: Total Assistance - Patient < 25%  Assistive device: Lite Gait    Walk 150 feet activity   Assist Walk 150 feet activity did not occur: Safety/medical concerns         Walk 10 feet on uneven surface  activity   Assist Walk 10 feet on uneven surfaces activity did not occur: Safety/medical concerns         Wheelchair     Assist Will patient use wheelchair at discharge?: Yes Type of Wheelchair: Manual    Wheelchair assist level: Minimal Assistance - Patient > 75% Max  wheelchair distance: 162ft    Wheelchair 50 feet with 2 turns activity    Assist        Assist Level: Minimal Assistance - Patient > 75%   Wheelchair 150 feet activity     Assist Wheelchair 150 feet activity did not occur: Safety/medical concerns   Assist Level: Minimal Assistance - Patient > 75%      Medical Problem List and Plan:  1. Impaired ADLs/mobility and function secondary to PML with R hemiparesis and spasticity   Continue CIR  Functional goals as well as modification of discharge date given new findings on MRI/diagnosis with poor prognosis have been reviewed with rehab team  Given current diagnosis and limited interventions, patient and family have chosen to pursue hospice 2. Antithrombotics:   -DVT/anticoagulation: Pharmaceutical: Lovenox   -antiplatelet therapy: n/a  3. Chronic pudendal pain/Pain Management: Cymbalta  Continue methadone bid, afternoon dose added, changed dosing to 10 mg 3 times daily  Monitor with increased mobility  ?Rash allergy to Lyrica-tolerating retrial, increased on 1/15  Baclofen 5 TID started on 1/8, increased on 1/13  Added anusol supp HCT BID  Cont Tucks pad to anal area QID  Continue ice  Improved pain control on Lyrica 50mg  BID, may titrate as needed   -consider further baclofen titration  Controlled on 1/26   4. Mood: LCSW to follow for evaluation and support.   Klonopin 0.5 twice daily for anxiety   Appears relatively controlled on 1/26  -antipsychotic agents: N/A  5. Neuropsych: This patient is ? fully capable of making decisions on her own behalf.  6. Skin/Wound Care: Routine pressure relief measures.  7. 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 Improved.   Creatinine 1.02 on 1/18  Encourage fluids  Labs tomorrow 9. Hypothyroid: Resume supplement.  10. H/o depression: On Lutuda, cymbalta and Clonazepam  11.  PML: Follow up MRI brain ordered, please see above.  Decadron  being slowly weaned per neurology 12. Steroid induced hyperglycemia: Hgb A1c-5.4.   CBGs labile on 1/26 13. OIC/?IBS: On multiple medications:  Laculose changed to PRN per patient preference  Senna decreased on 1/13  Linzess restarted per pt/family preference   Stable with meds on 1/23--bm  14. 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   Discussed with Neuro/Onc and Heme/Onc.  Per Heme/Onc cont to monitor and no cause for concern unless acute spike.  Afebrile  Cont to monitor 14. Iron deficiently anemia: Resolved  Added iron supplement   Hb 11.0 on 1/25  Cont to monitor 15. Spasticity of R ankle, RUE  See #3  [facility]  1/23 ordered [facility]  16.  Neurogenic bladder  Continue ditropan qhs 17.  Right scapular pain  X-ray reviewed, unremarkable for fracture  Voltaren gel ordered on 1/13  Improved 18.  Hypotension  Asymptomatic at present  Stable on 1/26    LOS: 20 days A FACE TO FACE EVALUATION WAS  PERFORMED  Mohab Ashby Lorie Phenix 09/29/2019, 8:34 AM

## 2019-09-29 NOTE — Progress Notes (Signed)
Occupational Therapy Session Note  Patient Details  Name: Renee [name] MRN: [id number] Date of Birth: 14-Apr-1946  Today's Date: 09/29/2019 OT Individual Time: 1315-1400 OT Individual Time Calculation (min): 45 min   Skilled Therapeutic Interventions/Progress Updates:    Pt was on the toilet when arrived. Pt presented with significant lean to the right requiring support from the STEDY and mutlimodal cues to shift weight to the left . Pt required mod A to come into standing but pt with significant forward posture bent over the bar requiring max A twice to come back to upright position (difficulty maintaining upright trunk posture). Pt changed shirt to clean shirt with min A with extra time and cues for hemi dressing technique. Pt performed oral care with setup. Pt required mod to max A for w/c mobility (continually running to items on the right). Pt still with right foot swollen and red - shoe removed and kept off.   Pt wanted to remain up in the w/c with daughter present.   Therapy Documentation Precautions:  Precautions Precautions: Fall Precaution Comments: Significant weakness at RLE, impaired R UE proprioception Restrictions Weight Bearing Restrictions: No Pain:  no c/o pain   Therapy/Group: Individual Therapy  Willeen Cass [facility] 09/29/2019, 2:13 PM

## 2019-09-29 NOTE — Progress Notes (Signed)
Palliative Medicine RN Note: Rec'd call from [name], PA with Rehab/CIR asking about setting up hospice for Mrs Pettee. Plan is for discharge tomorrow, and we don't want to duplicate services such as equipment/therapy/etc that hospice may pay for. Mrs Laracuente lives in [place], [place], and I do not know who provides hospice services in that area, so I will make some calls. At [facility] request, I will update the Rehab/CIR SW/CM team Lorre Nick and Neoma Laming [phone]) when I find out.   I spoke with [facility], who is the provider for that area, and they should be able to get equipment out in time for a discharge tomorrow afternoon, pending receipt of records from me (fax [phone]). I called Mrs Mckennon daughter [name] to confirm address, and she reports that they do NOT want hospice; they want PALLIATIVE services at discharge. [name] reports that they will transition to hospice care when they are ready, but they are not ready for that now.   I called Lucy, the SW on CIR/Rehab unit and had to leave a VM. I reported that, because she is going home with PALLIATIVE care, regular orders will be needed from the CIR team for DME, therapy, etc, as home palliative layers on top of that. Mrs Cayce can convert to hospice care at any time once she gets home. I also ensured that the order for home palliative care is clear.   Records faxed to [facility].  Marjie Skiff Cordelro Gautreau, RN, BSN, [facility] Palliative Medicine Team 09/29/2019 2:58 PM Office [phone]

## 2019-09-29 NOTE — Plan of Care (Signed)
  Problem: Consults Goal: RH GENERAL PATIENT EDUCATION Description: See Patient Education module for education specifics. Outcome: Progressing Goal: Nutrition Consult-if indicated Outcome: Progressing   Problem: RH BOWEL ELIMINATION Goal: RH STG MANAGE BOWEL WITH ASSISTANCE Description: STG Manage Bowel with mod Assistance.  Outcome: Progressing   Problem: RH BLADDER ELIMINATION Goal: RH STG MANAGE BLADDER WITH ASSISTANCE Description: STG Manage Bladder With mod Assistance Outcome: Progressing   Problem: RH SKIN INTEGRITY Goal: RH STG SKIN FREE OF INFECTION/BREAKDOWN Description: Patients skin will remain free from further infection or breakdown with min assist. Outcome: Progressing   Problem: RH SAFETY Goal: RH STG ADHERE TO SAFETY PRECAUTIONS W/ASSISTANCE/DEVICE Description: STG Adhere to Safety Precautions With min Assistance/Device. Outcome: Progressing   Problem: RH PAIN MANAGEMENT Goal: RH STG PAIN MANAGED AT OR BELOW PT'S PAIN GOAL Description: < 3 Outcome: Progressing

## 2019-09-29 NOTE — Progress Notes (Signed)
Physical Therapy Discharge Summary  Patient Details  Name: Renee [name] MRN: [id number] Date of Birth: [birth year]  Today's Date: 09/29/2019 PT Individual Time: 0920-1020 and 3500-9381 PT Individual Time Calculation (min): 60 min and 23 min   and  Today's Date: 09/29/2019 PT Missed Time: 22 Minutes Missed Time Reason: Patient fatigue  Renee [name] has a progressive medical diagnosis that has resulted in a rapid decline in her R hemibody strength causing a decline in her functional mobility. Therefore, patient has met 0 of 7 long term goals due to worsening R hemibody paresis/paralysis where she initially had active movement of R LE and now has no active movement.  Patient to discharge at a wheelchair and hospital bed level Mod Assist squat pivot or stedy transfers. Patient's care partners have attended hands-on family training and are independent to provide the necessary physical and cognitive assistance at discharge.  Reasons goals not met: Due to the progression of pt's medical diagnosis resulting in decreased R hemibody strength pt has not met her LTGs as she continues to require mod assist for bed mobility, squat pivot bed<>chair transfers, sit<>stand transfers in stedy, and assist for w/c propulsion. Pt is planning to D/C home via ambulance transportation.    Recommendation:  Patient will benefit from ongoing skilled PT services in home health setting to continue to address ongoing impairments in R hemibody strength, bed mobility, transfer training, pt comfort via positioning for pressure relief, minimize fall risk, and decrease caregiver burden with pt's medical prognosis and progression.   Equipment: Hospital bed; pt has all other necessary DME and pt's daughter purchased a stedy  Reasons for discharge: change in medical status and discharge from hospital  Patient/family agrees with progress made and goals achieved: Yes  Skilled Therapeutic Interventions/Progress Updates:  Session 1:  Patient received supine in bed and agreeable to therapy session. Pt wearing R LE custom AFO with noticed bright redness, swelling, and warm to the touch around the ankle and foot (dorsal and plantar surfaces). Supine>sitting EOB with mod assist for R hemibody management and trunk upright. Sitting EOB with L UE support and 1x mod assist to recover posterior LOB while therapist donned B LE shoes max assist. Pt noted to be incontinent. L squat pivot EOB>w/c with mod assist for lifting/pivoting hips. Sit<>stands using stedy with mod assist for lifting into standing and min/mod assist for standing balance due to R lateral trunk lean while therapist performed total assist LB clothing management and peri-care. Pt performed ~44f L hemibody w/c propulsion with supervision and increased time with pt then reporting onset of fatigue. Transported back to room in w/c. L squat pivot transfer w/c>EOB using bedrail and mod assist for lifting/pivoting hips. Sit>supine with mod assist for B LE management. Renee [name] notified and present to assess pt's R foot/ankle redness and swelling - recommended leaving pt's AFO off until next therapy session to determine if change in symptoms occur. Pt therapeutically positioned with B LEs elevated on pillows (R>L) and RUE support on pillows. Pt left with needs in reach and bed alarm on.   Session 2: Pt received sitting in w/c with the daughter, BHorris [name] present and pt requesting to return to bed. Therapist educated the daughter on the written hand-out from prior therapist that provided step-by-step sequences of stedy and squat pivot transfers as well as education to consult HHPT or HHOT if those transfers becoming increasingly more challenging. Therapist reassessed pt's R foot/ankle and noted that it seemed less red and less warm to the touch  but did have increased swelling - therapist notified Renee [name] of the pt's daughter's concerns about this. Therapist donned R shoe for transfer. Pt's daughter  deferred performing hands-on assist for patient's transfer at this time. L squat pivot transfer w/c>EOB with mod assist for lifting/pivoting hips. Sit>supine with mod assist for B LE management and mod cuing for sequencing. Pt therapeutically positioned in supine with B LEs elevated (R>L) and R UE supported on pillows. Therapist educated pt's daughter on proper donning of PRAFO. Pt and daughter report no questions or concerns at this time and report feeling prepared for D/C home tomorrow. Pt left supine in bed with needs in reach and her daughter present. Missed 22 minutes of skilled physical therapy.   PT Discharge Precautions/Restrictions Precautions Precautions: Fall Precaution Comments: Significant weakness at RLE, impaired R UE proprioception Restrictions Weight Bearing Restrictions: No Pain Pain Assessment Pain Scale: 0-10 Pain Score: 0-No pain("no, I'm doing pretty good") Perception  Perception Perception: Impaired Inattention/Neglect: Does not attend to right side of body Praxis Praxis: Impaired Praxis Impairment Details: Motor planning  Cognition Overall Cognitive Status: Impaired/Different from baseline Arousal/Alertness: Awake/alert Orientation Level: Oriented X4 Attention: Focused;Sustained Focused Attention: Appears intact Sustained Attention: Appears intact Selective Attention: Appears intact Problem Solving: Impaired Safety/Judgment: Appears intact Sensation Sensation Light Touch: Impaired Detail Light Touch Impaired Details: Impaired RLE;Impaired RUE Hot/Cold: Not tested Proprioception: Impaired Detail Proprioception Impaired Details: Impaired RUE;Impaired RLE Stereognosis: Not tested Coordination Gross Motor Movements are Fluid and Coordinated: No Coordination and Movement Description: no active movement in RUE and RLE Heel Shin Test: unable to complete RLE due to strength deficits Motor  Motor Motor: Hemiplegia;Abnormal tone;Motor apraxia Motor -  Discharge Observations: no active movement in RUE and RLE  Mobility Bed Mobility Bed Mobility: Rolling Right;Rolling Left Rolling Right: Minimal Assistance - Patient > 75% Supine to Sit: Moderate Assistance - Patient 50-74% Sit to Supine: Moderate Assistance - Patient 50-74% Transfers Transfers: Sit to Stand;Stand to Sit Sit to Stand: Moderate Assistance - Patient 50-74%(in stedy) Stand to Sit: Moderate Assistance - Patient 50-74%(in stedy) Squat Pivot Transfers: Moderate Assistance - Patient 50-74% Transfer (Assistive device): Other (Comment)(stedy) Locomotion  Gait Ambulation: No Gait Gait: No Stairs / Additional Locomotion Stairs: No Wheelchair Mobility Wheelchair Mobility: Yes Wheelchair Assistance: Supervision/Verbal cueing Wheelchair Propulsion: Left upper extremity;Left lower extremity Wheelchair Parts Management: Needs assistance Distance: 8f  Trunk/Postural Assessment  Cervical Assessment Cervical Assessment: Within Functional Limits Thoracic Assessment Thoracic Assessment: Exceptions to WFL(thoracic rounding with rounded shoulders and "slumped" posture) Lumbar Assessment Lumbar Assessment: Exceptions to WFL(posterior pelvic tilt in sitting) Postural Control Postural Control: Deficits on evaluation Righting Reactions: significantly impaired posterior and R requiring mod assist for recovery despite use of L UE support Protective Responses: delayed on the R Postural Limitations: constant R/posteroir lean  Balance Balance Balance Assessed: Yes Static Sitting Balance Static Sitting - Balance Support: Feet supported;Left upper extremity supported Static Sitting - Level of Assistance: 5: Stand by assistance;4: Min assist Dynamic Sitting Balance Dynamic Sitting - Balance Support: Feet supported;Left upper extremity supported Dynamic Sitting - Level of Assistance: 2: Max assist Static Standing Balance Static Standing - Balance Support: During functional  activity;Left upper extremity supported Static Standing - Level of Assistance: 3: Mod assist;2: Max assist(in stedy) Extremity Assessment      RLE Assessment RLE Assessment: Exceptions to [facility] Health SystemRLE Strength Right Hip Flexion: 0/5 Right Hip Extension: 0/5 Right Knee Flexion: 0/5 possible hamstring activation noted when therapist placing stedy in front of pt but difficult to determine and  unable to replicate Right Knee Extension: 0/5 Right Ankle Dorsiflexion: 0/5 Right Ankle Plantar Flexion: 0/5 LLE Assessment LLE Assessment: Within Functional Limits General Strength Comments: grossly 4+/5 to 5/5 throughout    Tawana Scale, PT, DPT 09/29/2019, 7:48 AM

## 2019-09-29 NOTE — Progress Notes (Signed)
I saw Ms. Buske this morning.  She was in good spirits.  She is always very charming and a lot of fun to talk to..  I told her about the decision not to pursue therapy for the CLL.  I told her that even if we treated the CLL, that the PML would not improve.  Given this information, I really do not believe that putting her through treatment and toxicity for CLL would be beneficial.  She understands this.  She is getting ready to go home tomorrow from what I have heard.  She says her left side might be a little bit better.  There was no change with weakness on her right side.  She is having no problems swallowing.  There is no cough or shortness of breath.  She has had no nausea.  She is a little constipated.  As always, we had a very good prayer.  Her faith remains strong.  I did speak to her daughter yesterday.  Her daughter understands all of this.  We will get Hospice involved when she does get home.  I very much appreciate the incredible compassion and input that was provided by her oncologist at [facility].  It is obvious that he is incredibly gifted and treats his patients at a very high level.  Again I just feel bad that we have not been able to help her out.  This is an incredibly rare situation with the PML and never having treatment.  There just is not much information out there.  I know that she has received outstanding care by everybody on the Rehab Unit.  Lattie Haw, MD  Darlyn Chamber 17:14

## 2019-09-29 NOTE — Progress Notes (Signed)
  Patient ID: Renee [name], female   DOB: 1945/09/23, 74 y.o.   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 A hoyer lift will allow patient to safely perform daily activities and safe transfers into and out of w/c and hospital bed.  Pt has a caregiver who can perform hoyer transfers for pt.  [name], PA-C

## 2019-09-29 NOTE — Significant Event (Addendum)
Hypoglycemic Event  CBG: 64  Treatment: 2 cups of OJ, Breakfast tray with pancakes hot chocolate  Symptoms:  Asymptomatic   Follow-up CBG: Time: 0740 CBG Result: 79  Possible Reasons for Event: No known cause  Comments/MD notified:   Day Shift LPN Yinka aware, and will recheck at [place].   [place], LPN

## 2019-09-30 LAB — GLUCOSE, CAPILLARY
Glucose-Capillary: 70 mg/dL (ref 70–99)
Glucose-Capillary: 84 mg/dL (ref 70–99)

## 2019-09-30 MED ORDER — DICLOFENAC SODIUM 1 % EX GEL: 2.0000 g | Freq: Four times a day (QID) | CUTANEOUS | 0 refills | Status: AC

## 2019-09-30 MED ORDER — CEPHALEXIN 250 MG PO CAPS
250.0000 mg | ORAL_CAPSULE | Freq: Three times a day (TID) | ORAL | Status: DC
  Administered 2019-09-30: 250 mg via ORAL
  Filled 2019-09-30: qty 1

## 2019-09-30 MED ORDER — LIDOCAINE 5 % EX PTCH: 2.0000 | MEDICATED_PATCH | CUTANEOUS | 0 refills | Status: AC

## 2019-09-30 MED ORDER — ACETAMINOPHEN 325 MG PO TABS: 650.0000 mg | ORAL_TABLET | Freq: Three times a day (TID) | ORAL | Status: AC

## 2019-09-30 MED ORDER — CLONAZEPAM 0.5 MG PO TABS: 0.5000 mg | ORAL_TABLET | Freq: Two times a day (BID) | ORAL | 1 refills | Status: AC

## 2019-09-30 MED ORDER — CEPHALEXIN 250 MG PO CAPS: 250.0000 mg | ORAL_CAPSULE | Freq: Three times a day (TID) | ORAL | 0 refills | Status: AC

## 2019-09-30 MED ORDER — HYDROCORTISONE ACETATE 25 MG RE SUPP: 25.0000 mg | Freq: Two times a day (BID) | RECTAL | 0 refills | Status: AC

## 2019-09-30 MED ORDER — DEXAMETHASONE 1 MG PO TABS: ORAL_TABLET | ORAL | 0 refills | Status: AC

## 2019-09-30 MED ORDER — METHADONE HCL 10 MG PO TABS: 10.0000 mg | ORAL_TABLET | Freq: Three times a day (TID) | ORAL | 0 refills | Status: AC

## 2019-09-30 MED ORDER — LURASIDONE HCL 40 MG PO TABS: 40.0000 mg | ORAL_TABLET | Freq: Every day | ORAL | 0 refills | Status: AC

## 2019-09-30 MED ORDER — METHADONE HCL 10 MG PO TABS: 10.0000 mg | ORAL_TABLET | Freq: Three times a day (TID) | ORAL | 0 refills | Status: DC

## 2019-09-30 MED ORDER — BISACODYL 10 MG RE SUPP: 10.0000 mg | Freq: Every day | RECTAL | 0 refills | Status: AC | PRN

## 2019-09-30 MED ORDER — MELATONIN 3 MG PO TABS: 3.0000 mg | ORAL_TABLET | Freq: Every day | ORAL | 0 refills | Status: AC

## 2019-09-30 MED ORDER — WITCH HAZEL-GLYCERIN EX PADS: MEDICATED_PAD | Freq: Three times a day (TID) | CUTANEOUS | 12 refills | Status: AC

## 2019-09-30 MED ORDER — BACLOFEN 10 MG PO TABS: 10.0000 mg | ORAL_TABLET | Freq: Three times a day (TID) | ORAL | 1 refills | Status: AC

## 2019-09-30 MED ORDER — PREGABALIN 50 MG PO CAPS: 50.0000 mg | ORAL_CAPSULE | Freq: Two times a day (BID) | ORAL | 0 refills | Status: AC

## 2019-09-30 NOTE — Progress Notes (Signed)
Pt was given D/C instructions by Algis Liming, PA. Pt was accompanied by daughter. All belongings were packed and sent home with pt. Pt left via ambulance. No further questions or concerns at this time. Amanda Cockayne, LPN

## 2019-09-30 NOTE — Progress Notes (Signed)
Pt c/o urinary frequency and urgency. This nurse and 2 NT attempted to get pt on the steady and to the [facility]. Pt was able to help less than 25% with the transfer. Pt unable to sit up on the side of the bed w/ out falling over. Pt has concern about using the steady with just one caregiver  Once discharged home.

## 2019-09-30 NOTE — Plan of Care (Signed)
  Problem: Consults Goal: RH GENERAL PATIENT EDUCATION Description: See Patient Education module for education specifics. 09/30/2019 1541 by Amanda Cockayne, LPN Outcome: Completed/Met 09/30/2019 0956 by Amanda Cockayne, LPN Outcome: Progressing Goal: Nutrition Consult-if indicated 09/30/2019 1541 by Amanda Cockayne, LPN Outcome: Completed/Met 09/30/2019 0956 by Amanda Cockayne, LPN Outcome: Progressing   Problem: RH BOWEL ELIMINATION Goal: RH STG MANAGE BOWEL WITH ASSISTANCE Description: STG Manage Bowel with mod Assistance.  09/30/2019 1541 by Toy Cookey F, LPN Outcome: Completed/Met 09/30/2019 0956 by Amanda Cockayne, LPN Outcome: Progressing   Problem: RH BLADDER ELIMINATION Goal: RH STG MANAGE BLADDER WITH ASSISTANCE Description: STG Manage Bladder With mod Assistance 09/30/2019 1541 by Amanda Cockayne, LPN Outcome: Completed/Met 09/30/2019 0956 by Amanda Cockayne, LPN Outcome: Progressing   Problem: RH SKIN INTEGRITY Goal: RH STG SKIN FREE OF INFECTION/BREAKDOWN Description: Patients skin will remain free from further infection or breakdown with min assist. 09/30/2019 1541 by Amanda Cockayne, LPN Outcome: Completed/Met 09/30/2019 0956 by Amanda Cockayne, LPN Outcome: Progressing   Problem: RH SAFETY Goal: RH STG ADHERE TO SAFETY PRECAUTIONS W/ASSISTANCE/DEVICE Description: STG Adhere to Safety Precautions With min Assistance/Device. 09/30/2019 1541 by Amanda Cockayne, LPN Outcome: Completed/Met 09/30/2019 0956 by Amanda Cockayne, LPN Outcome: Progressing   Problem: RH PAIN MANAGEMENT Goal: RH STG PAIN MANAGED AT OR BELOW PT'S PAIN GOAL Description: < 3 09/30/2019 1541 by Amanda Cockayne, LPN Outcome: Completed/Met 09/30/2019 0956 by Amanda Cockayne, LPN Outcome: Progressing

## 2019-09-30 NOTE — Progress Notes (Signed)
[facility] PHYSICAL MEDICINE & REHABILITATION PROGRESS NOTE  Subjective/Complaints: Patient seen laying in bed this AM.  She states she slept well overnight.  She is ready for discharge.  Redness on toes examined, discussed with PA and nurse tech.   ROS: Denies CP, SOB, N/V/D  Objective: Vital Signs: Blood pressure (!) 84/58, pulse 70, temperature 97.6 F (36.4 C), temperature source Oral, resp. rate 16, weight 78.9 kg, SpO2 100 %. No results found. Recent Labs    09/28/19 0732  WBC 89.7*  HGB 11.0*  HCT 38.4  PLT 193   Recent Labs    09/28/19 0732  NA 139  K 4.6  CL 102  CO2 26  GLUCOSE 90  BUN 20  CREATININE 0.90  CALCIUM 8.8*    Physical Exam: BP (!) 84/58 (BP Location: Left Arm)   Pulse 70   Temp 97.6 F (36.4 C) (Oral)   Resp 16   Wt 78.9 kg   SpO2 100%   BMI 22.95 kg/m  Constitutional: No distress . Vital signs reviewed. HENT: Normocephalic.  Atraumatic. Eyes: EOMI. No discharge. Cardiovascular: No JVD. Respiratory: Normal effort.  No stridor. GI: Non-distended. Skin: Warm and dry.  Intact. Psych: Flat. Musc: Erythema along toes on RLE, no pain or edema or drainage. Neuro: Alert Motor:  RUE: 0/5 proximal distal, unchanged Left upper extremity: 5/5 proximal distal, unchanged RLE: 0/5 proximal to distal, unchanged Left lower extremity: Hip flexion, knee extension 4+/5, ankle dorsiflexion 5/5  Assessment/Plan: 1. 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  Physiatrist is providing close team supervision and 24 hour management of active medical problems listed below.  Physiatrist and rehab team continue to assess barriers to discharge/monitor patient progress toward functional and medical goals  Care Tool:  Bathing  Bathing activity did not occur: Refused Body parts bathed by patient: Right arm, Chest, Abdomen, Right upper leg, Left upper leg,  Front perineal area, Face   Body parts bathed by helper: Right lower leg, Left lower leg, Left arm, Buttocks Body parts n/a: Front perineal area, Buttocks   Bathing assist Assist Level: Moderate Assistance - Patient 50 - 74%     Upper Body Dressing/Undressing Upper body dressing   What is the patient wearing?: Pull over shirt    Upper body assist Assist Level: Minimal Assistance - Patient > 75%    Lower Body Dressing/Undressing Lower body dressing      What is the patient wearing?: Underwear/pull up, Pants     Lower body assist Assist for lower body dressing: Total Assistance - Patient < 25%(sit to stand with stedy lift)     Toileting Toileting    Toileting assist Assist for toileting: Maximal Assistance - Patient 25 - 49%     Transfers Chair/bed transfer  Transfers assist     Chair/bed transfer assist level: Moderate Assistance - Patient 50 - 74%(squat pivot)     Locomotion Ambulation   Ambulation assist   Ambulation activity did not occur: Safety/medical concerns  Assist level: 2 helpers Assistive device: Parallel bars Max distance: 3 ft   Walk 10 feet activity   Assist  Walk 10 feet activity did not occur: Safety/medical concerns  Assist level: Total Assistance - Patient < 25% Assistive device: Lite Gait   Walk 50 feet activity   Assist Walk 50 feet with 2 turns activity did not occur: Safety/medical concerns  Assist level: Total Assistance - Patient < 25% Assistive device:  Lite Gait    Walk 150 feet activity   Assist Walk 150 feet activity did not occur: Safety/medical concerns         Walk 10 feet on uneven surface  activity   Assist Walk 10 feet on uneven surfaces activity did not occur: Safety/medical concerns         Wheelchair     Assist Will patient use wheelchair at discharge?: Yes Type of Wheelchair: Manual    Wheelchair assist level: Set up assist, Supervision/Verbal cueing Max wheelchair distance: 23ft     Wheelchair 50 feet with 2 turns activity    Assist        Assist Level: Set up assist, Supervision/Verbal cueing   Wheelchair 150 feet activity     Assist Wheelchair 150 feet activity did not occur: Safety/medical concerns   Assist Level: Minimal Assistance - Patient > 75%      Medical Problem List and Plan:  1. Impaired ADLs/mobility and function secondary to PML with R hemiparesis and spasticity   DC today  Given current diagnosis and limited interventions, patient and family have chosen to pursue hospice  2. Antithrombotics:   -DVT/anticoagulation: Pharmaceutical: Lovenox   -antiplatelet therapy: n/a  3. Chronic pudendal pain/Pain Management: Cymbalta  Continue methadone bid, afternoon dose added, changed dosing to 10 mg 3 times daily  Monitor with increased mobility  ?Rash allergy to Lyrica-tolerating retrial, increased on 1/15  Baclofen 5 TID started on 1/8, increased on 1/13  Added anusol supp HCT BID  Cont Tucks pad to anal area QID  Continue ice  Improved pain control on Lyrica 50mg  BID, may titrate as needed   -consider further baclofen titration  Controlled on 1/27 4. Mood: LCSW to follow for evaluation and support.   Klonopin 0.5 twice daily for anxiety   Appears relatively controlled on 1/27  -antipsychotic agents: N/A  5. Neuropsych: This patient is ? fully capable of making decisions on her own behalf.  6. Skin/Wound Care: Routine pressure relief measures.  7. 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 Improved.   Creatinine 1.02 on 1/18  Encourage fluids  Labs tomorrow 9. Hypothyroid: Resume supplement.  10. H/o depression: On Lutuda, cymbalta and Clonazepam  11.  PML: Follow up MRI brain ordered, please see above.  Decadron being slowly weaned per neurology 12. Steroid induced hyperglycemia: Hgb A1c-5.4.   CBGs slightly labile on 1/27 13. OIC/?IBS: On multiple medications:  Laculose changed to PRN  per patient preference  Senna decreased on 1/13  Linzess restarted per pt/family preference   Stable with meds on 1/23--bm  14. 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   Discussed with Neuro/Onc and Heme/Onc.  Per Heme/Onc cont to monitor and no cause for concern unless acute spike.  Afebrile  Cont to monitor 14. Iron deficiently anemia: Resolved  Added iron supplement   Hb 11.0 on 1/25  Cont to monitor 15. Spasticity of R ankle, RUE  See #3  [facility]  1/23 ordered [facility]  16.  Neurogenic bladder  Continue ditropan qhs 17.  Right scapular pain  X-ray reviewed, unremarkable for fracture  Voltaren gel ordered on 1/13  Improved 18.  Hypotension  Asymptomatic at present  Stable on 1/27   >30 minutes spent in total between myself and PA regarding counseling and coordination of care with Neurology, Heme/Onc, Palliative, ID regarding discharge disposition, goals of care, pain, further workup, prognosis, etc.   LOS: 21 days A FACE  TO FACE EVALUATION WAS PERFORMED  Deanie Jupiter Lorie Phenix 09/30/2019, 9:10 AM

## 2019-09-30 NOTE — Progress Notes (Signed)
Recreational Therapy Discharge Summary Patient Details  Name: Renee [name] MRN: [id number] Date of Birth: Dec 29, 1945 Today's Date: 09/30/2019  Long term goals set: 1  Long term goals met: 0  Comments on progress toward goals: TR sessions focused on discussing life satisfiers, activity analysis with potential modifications and coping strategies.  Pt stated that spending time/talking with her family helped her cope.  She also uses tv as a distraction.  Pt shared that her love of reading was a coping mechanism as well.  Education and handout provided on adaptive book holders available through [employer].  Discussed positioning options for reading. Goal not met as TR sessions focused on discussions/educaiton vs. actual physical participation. Pt is discharging home with family to provide 24 hours assistance.  Reasons for discharge: discharge from hospital  Patient/family agrees with progress made and goals achieved: Yes  Doha Boling 09/30/2019, 8:49 AM

## 2019-09-30 NOTE — Plan of Care (Signed)
  Problem: Consults Goal: RH GENERAL PATIENT EDUCATION Description: See Patient Education module for education specifics. Outcome: Progressing Goal: Nutrition Consult-if indicated Outcome: Progressing   Problem: RH BOWEL ELIMINATION Goal: RH STG MANAGE BOWEL WITH ASSISTANCE Description: STG Manage Bowel with mod Assistance.  Outcome: Progressing   Problem: RH BLADDER ELIMINATION Goal: RH STG MANAGE BLADDER WITH ASSISTANCE Description: STG Manage Bladder With mod Assistance Outcome: Progressing   Problem: RH SKIN INTEGRITY Goal: RH STG SKIN FREE OF INFECTION/BREAKDOWN Description: Patients skin will remain free from further infection or breakdown with min assist. Outcome: Progressing   Problem: RH SAFETY Goal: RH STG ADHERE TO SAFETY PRECAUTIONS W/ASSISTANCE/DEVICE Description: STG Adhere to Safety Precautions With min Assistance/Device. Outcome: Progressing   Problem: RH PAIN MANAGEMENT Goal: RH STG PAIN MANAGED AT OR BELOW PT'S PAIN GOAL Description: < 3 Outcome: Progressing

## 2019-09-30 NOTE — Discharge Instructions (Signed)
Inpatient Rehab Discharge Instructions  Renee [name] Discharge date and time: 09/30/19   Activities/Precautions/ Functional Status: Activity: activity as tolerated Diet: regular diet Wound Care: none needed   Functional status:  ___ No restrictions     ___ Walk up steps independently _X__ 24/7 supervision/assistance   ___ Walk up steps with assistance ___ Intermittent supervision/assistance  ___ Bathe/dress independently ___ Walk with walker     _X__ Bathe/dress with assistance ___ Walk Independently    ___ Shower independently ___ Walk with assistance    ___ Shower with assistance _X__ No alcohol     ___ Return to work/school ________  Special Instructions:  COMMUNITY REFERRALS UPON DISCHARGE:    Home Health:   PT     OT     ST                     Agency:  [facility] Phone: [phone]    Medical Equipment/Items Ordered:  Hospital bed, drop arm commode                                                      Agency/Supplier:  [employer] @ [phone]    My questions have been answered and I understand these instructions. I will adhere to these goals and the provided educational materials after my discharge from the hospital.  Patient/Caregiver Signature _______________________________ Date __________  Clinician Signature _______________________________________ Date __________  Please bring this form and your medication list with you to all your follow-up doctor's appointments.

## 2019-10-01 ENCOUNTER — Other Ambulatory Visit (HOSPITAL_COMMUNITY): Payer: Self-pay

## 2019-10-01 DIAGNOSIS — C9110 Chronic lymphocytic leukemia of B-cell type not having achieved remission: Secondary | ICD-10-CM

## 2019-10-01 DIAGNOSIS — G894 Chronic pain syndrome: Secondary | ICD-10-CM

## 2019-10-01 DIAGNOSIS — Z853 Personal history of malignant neoplasm of breast: Secondary | ICD-10-CM

## 2019-10-01 MED ORDER — MISC. DEVICES MISC: 0 refills | Status: AC

## 2019-10-12 ENCOUNTER — Encounter: Payer: Medicare Other | Admitting: Physical Medicine & Rehabilitation

## 2019-10-27 ENCOUNTER — Other Ambulatory Visit: Payer: Self-pay | Admitting: *Deleted

## 2019-11-02 DEATH — deceased

## 2019-11-12 ENCOUNTER — Ambulatory Visit (HOSPITAL_COMMUNITY): Payer: Medicare Other | Admitting: Hematology

## 2019-11-12 ENCOUNTER — Other Ambulatory Visit (HOSPITAL_COMMUNITY): Payer: Medicare Other

## 2019-11-17 ENCOUNTER — Ambulatory Visit (INDEPENDENT_AMBULATORY_CARE_PROVIDER_SITE_OTHER): Payer: Medicare Other | Admitting: Internal Medicine

## 2019-11-26 ENCOUNTER — Other Ambulatory Visit (HOSPITAL_COMMUNITY): Payer: Medicare Other

## 2019-11-26 ENCOUNTER — Ambulatory Visit (HOSPITAL_COMMUNITY): Payer: Medicare Other | Admitting: Hematology

## 2021-08-26 IMAGING — MR MR HEAD W/O CM
9 of 11 series · 35 of 48 positions shown · non-contrast
Comparison: Head CT same day

CLINICAL DATA: Fell today with right leg weakness.

EXAM:
MRI HEAD WITHOUT CONTRAST
MRA HEAD WITHOUT CONTRAST
TECHNIQUE: Multiplanar, multiecho pulse sequences of the brain and surrounding
structures were obtained without intravenous contrast. Angiographic
images of the head were obtained using MRA technique without
contrast.

[Series 2: T1 · sagittal · 5.0mm · 0.45mm/px · 1 of 20 slices shown (1 of 2)]
[im 1/20]
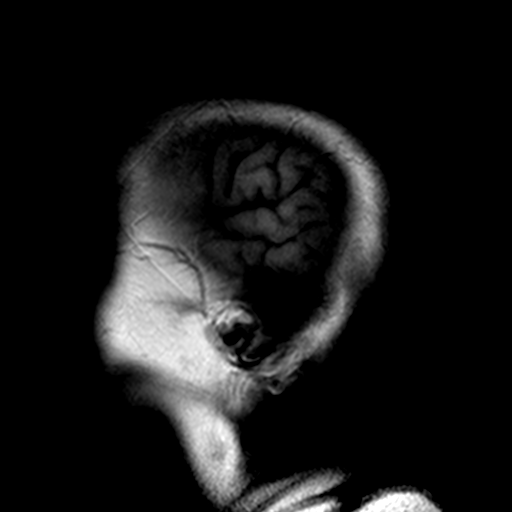

[Series 3: DWI · axial · 3.0mm · 0.82mm/px · z∈[+24,+175]mm · 7 of 110 slices shown (1 of 2)]
[im 1/110]
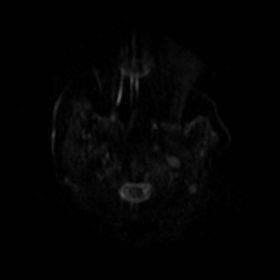
[im 19/110]
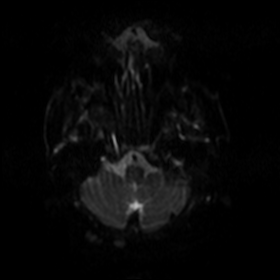
[im 37/110]
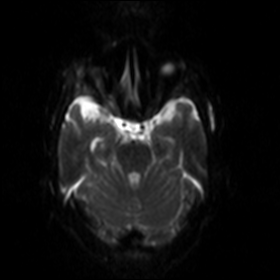
[im 55/110]
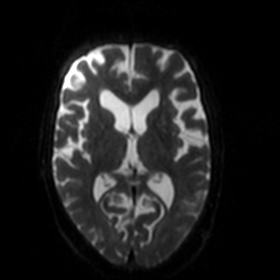
[im 73/110]
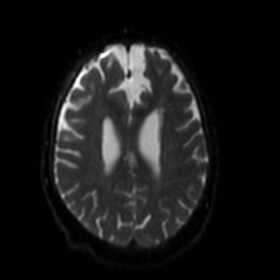
[im 91/110]
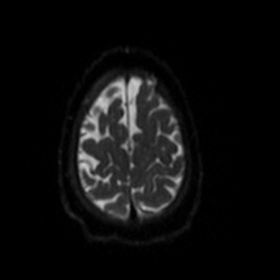
[im 110/110]
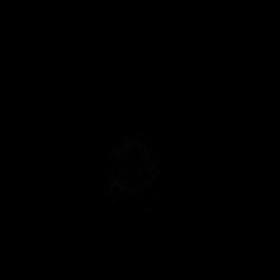

[Series 4: ax dwi_adc · axial · 3.0mm · 0.82mm/px · z∈[+24,+175]mm · 4 of 54 slices shown]
[im 1/54]
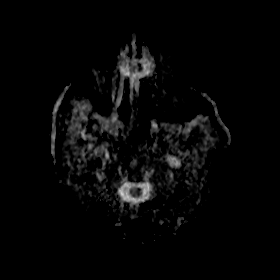
[im 18/54]
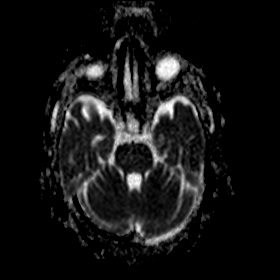
[im 36/54]
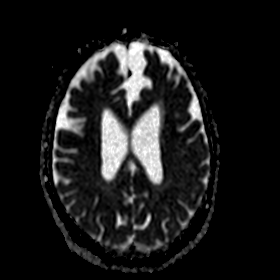
[im 54/54]
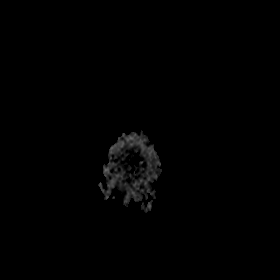

[Series 5: DWI · coronal · 5.0mm · 0.60mm/px · 5 of 68 slices shown (2 of 2)]
[im 1/68]
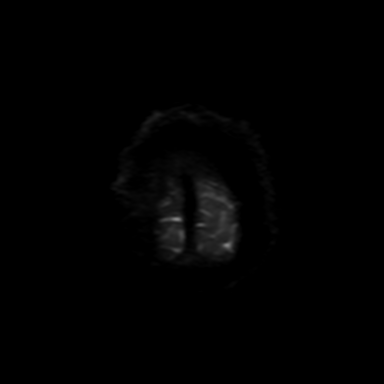
[im 17/68]
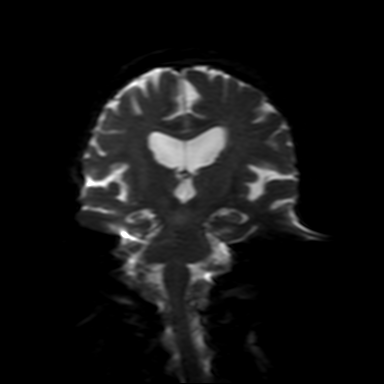
[im 34/68]
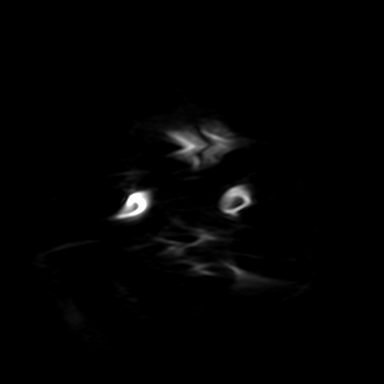
[im 51/68]
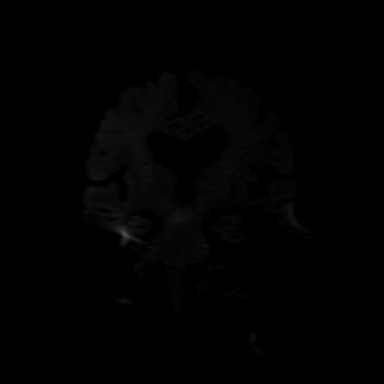
[im 68/68]
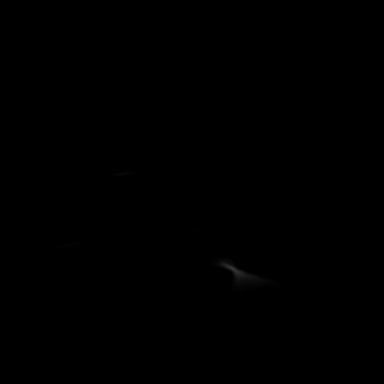

[Series 7: T2 · axial · 5.0mm · 0.75mm/px · z∈[+24,+159]mm · 2 of 23 slices shown (1 of 3)]
[im 1/23]
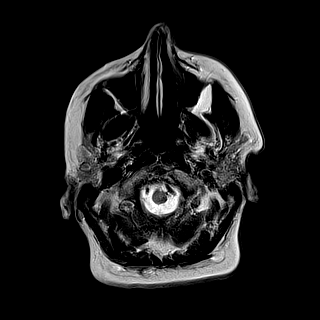
[im 23/23]
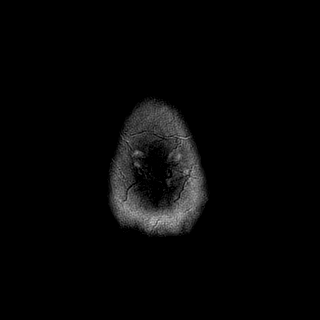

[Series 8: T2 · axial · 5.0mm · 0.45mm/px · z∈[+22,+158]mm · 2 of 23 slices shown (2 of 3)]
[im 1/23]
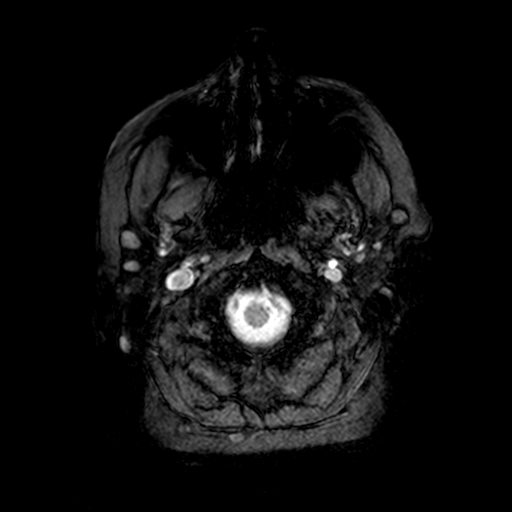
[im 23/23]
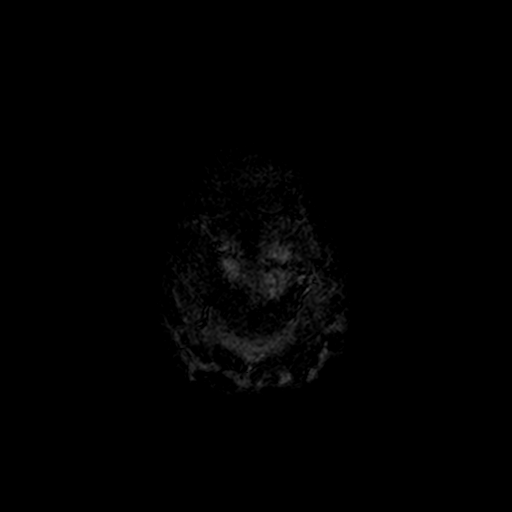

[Series 9: FLAIR · axial · 3.0mm · 0.94mm/px · z∈[+25,+158]mm · 4 of 48 slices shown]
[im 1/48]
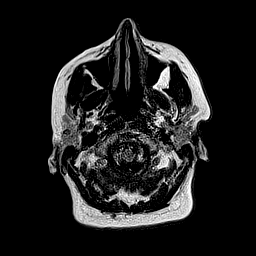
[im 16/48]
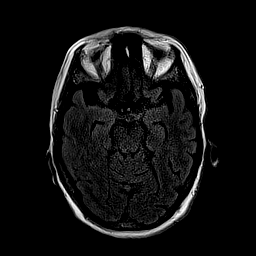
[im 32/48]
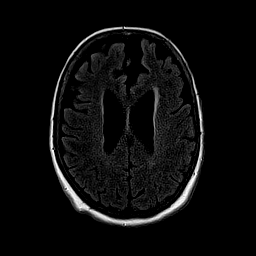
[im 48/48]
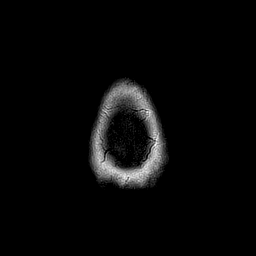

[Series 10: T1 · axial · 2.0mm · 0.47mm/px · z∈[-6,+189]mm · 8 of 104 slices shown (2 of 2)]
[im 1/104]
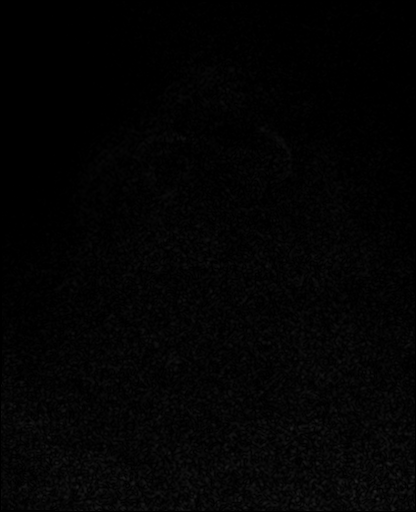
[im 15/104]
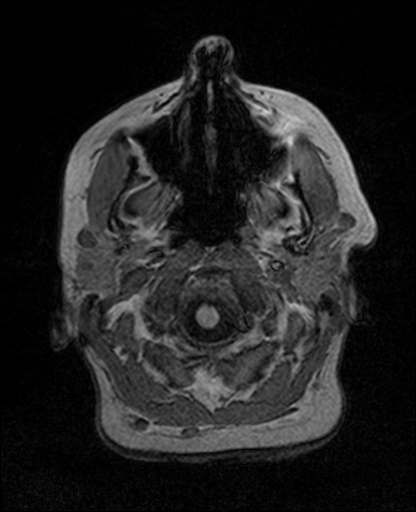
[im 30/104]
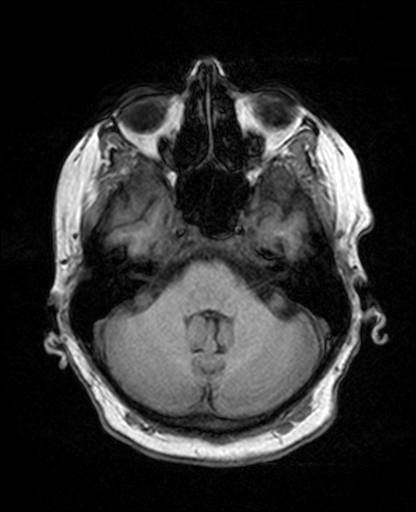
[im 45/104]
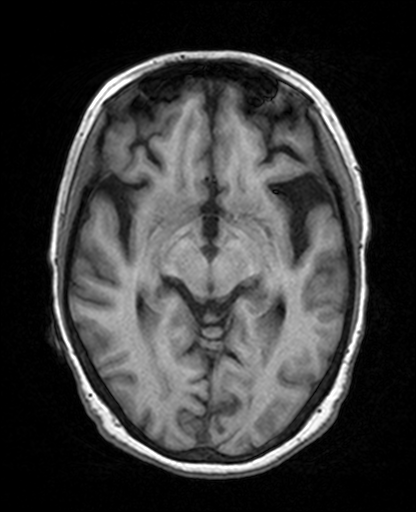
[im 59/104]
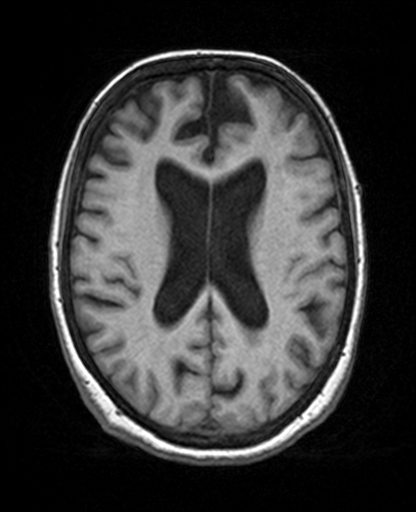
[im 74/104]
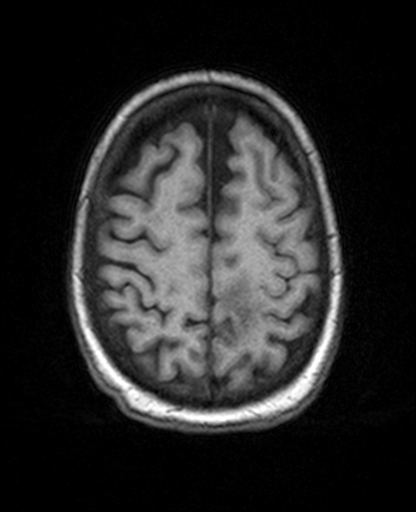
[im 89/104]
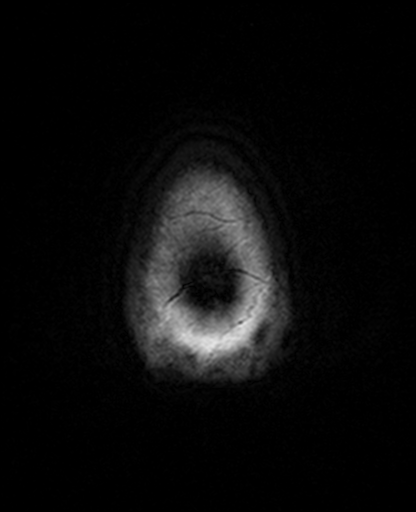
[im 104/104]
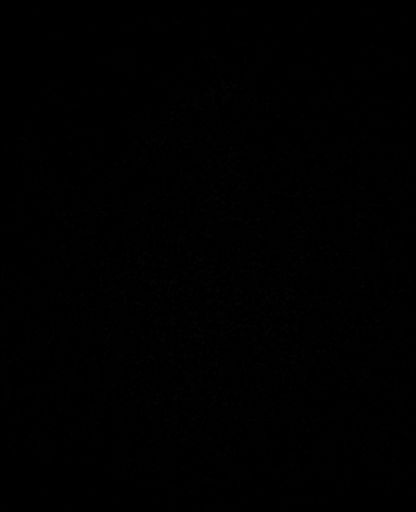

[Series 11: T2 · coronal · 5.0mm · 0.75mm/px · 2 of 28 slices shown (3 of 3)]
[im 1/28]
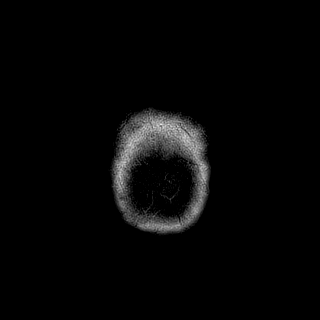
[im 28/28]
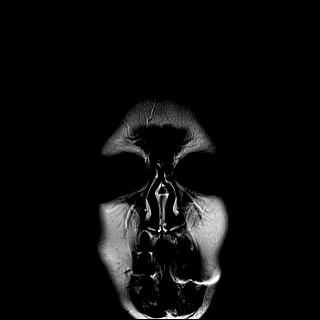

[35 of 48 positions shown; findings below may reference images not displayed]

FINDINGS: MRI HEAD FINDINGS

Brain: The brainstem and cerebellum are normal. Cerebral hemispheres
show age related volume loss with some frontal predominance. Right
cerebral hemisphere shows minimal small vessel change of the white
matter. Left cerebral hemisphere shows mild small vessel change of
the white matter. At the left parietal vertex, within the deep white
matter, there is a 2 cm in diameter cystic lesion with surrounding
low level restricted diffusion and some adjacent vasogenic edema.
This does not look like a typical ischemic infarction. No evidence
of susceptibility artifact to suggest resolving hematoma. The
differential diagnosis includes tumor and cerebritis, both
infectious inflammatory and autoimmune. This includes progressive
multifocal leukoencephalopathy. I would suggest postcontrast imaging
to evaluate this more completely.

Vascular: Major vessels at the base of the brain show flow.

Skull and upper cervical spine: Negative

Sinuses/Orbits: Clear/normal

Other: None

MRA HEAD FINDINGS

Both internal carotid arteries are widely patent into the brain. No
siphon stenosis. The anterior and middle cerebral vessels are patent
without proximal stenosis, aneurysm or vascular malformation.

Both vertebral arteries are widely patent to the basilar. No basilar
stenosis. Posterior circulation branch vessels appear normal.
IMPRESSION: Normal MR angiography.

Findings not suggestive of ischemic infarction. 2 cm cystic lesion
in the left parietal deep white matter with regional vasogenic edema
and a possible wall showing some restricted diffusion. Differential
diagnosis is tumor versus cerebritis. Postcontrast imaging would be
suggested for more complete evaluation.

## 2021-08-26 IMAGING — MR MR MRA HEAD W/O CM
9 of 11 series · 35 of 48 positions shown · non-contrast
Comparison: Head CT same day

CLINICAL DATA: Fell today with right leg weakness.

EXAM:
MRI HEAD WITHOUT CONTRAST
MRA HEAD WITHOUT CONTRAST
TECHNIQUE: Multiplanar, multiecho pulse sequences of the brain and surrounding
structures were obtained without intravenous contrast. Angiographic
images of the head were obtained using MRA technique without
contrast.

[Series 2: T1 · sagittal · 5.0mm · 0.45mm/px · 1 of 20 slices shown (1 of 2)]
[im 1/20]
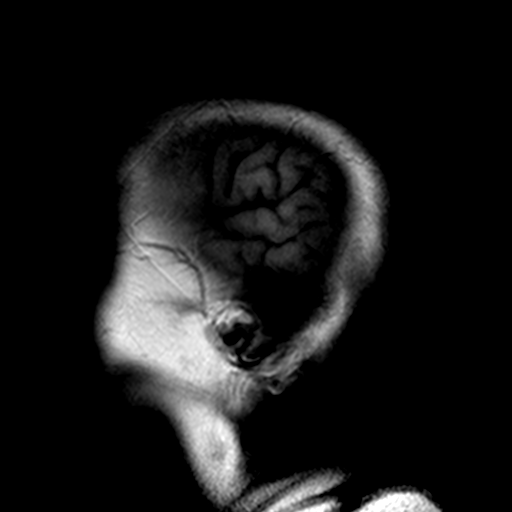

[Series 3: DWI · axial · 3.0mm · 0.82mm/px · z∈[+24,+175]mm · 7 of 110 slices shown (1 of 2)]
[im 1/110]
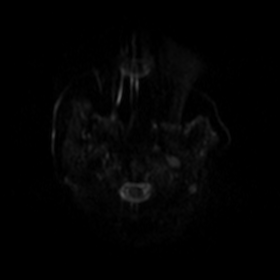
[im 19/110]
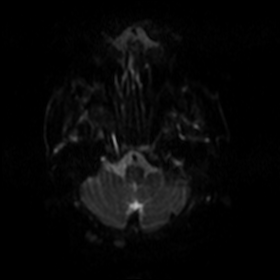
[im 37/110]
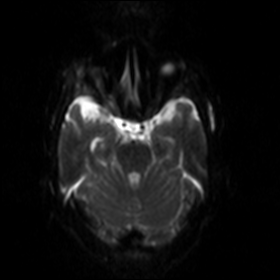
[im 55/110]
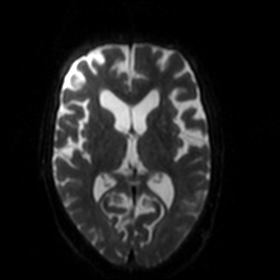
[im 73/110]
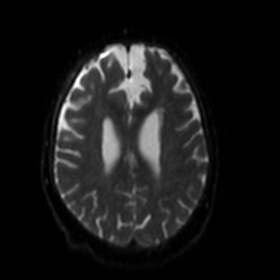
[im 91/110]
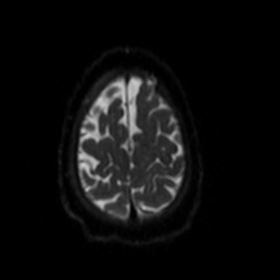
[im 110/110]
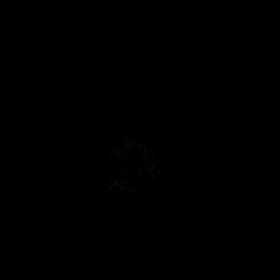

[Series 4: ax dwi_adc · axial · 3.0mm · 0.82mm/px · z∈[+24,+175]mm · 4 of 54 slices shown]
[im 1/54]
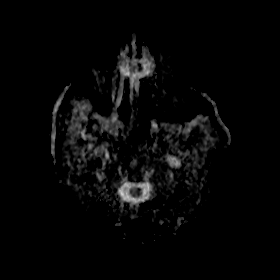
[im 18/54]
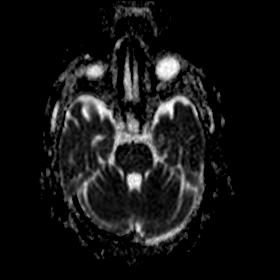
[im 36/54]
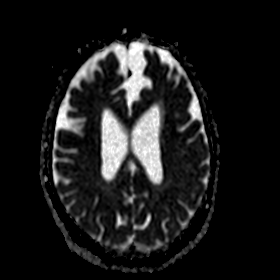
[im 54/54]
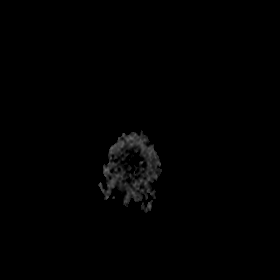

[Series 5: DWI · coronal · 5.0mm · 0.60mm/px · 5 of 68 slices shown (2 of 2)]
[im 1/68]
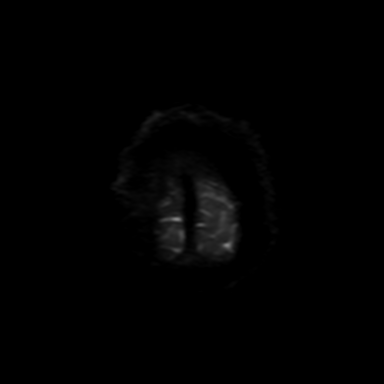
[im 17/68]
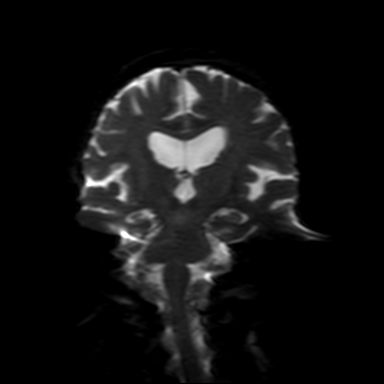
[im 34/68]
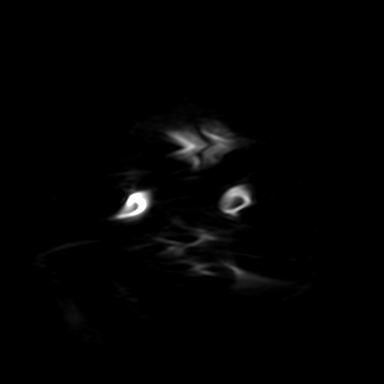
[im 51/68]
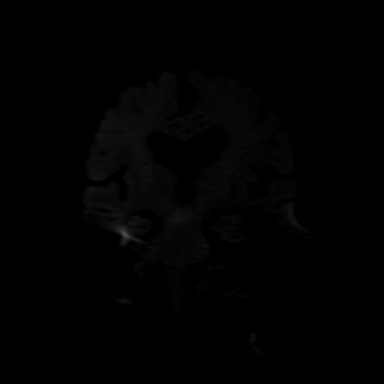
[im 68/68]
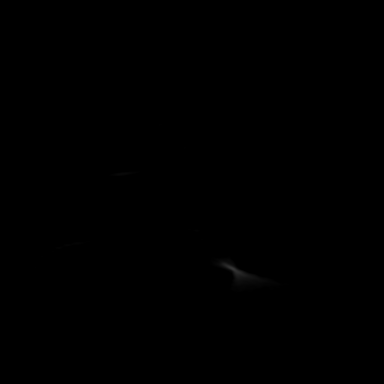

[Series 7: T2 · axial · 5.0mm · 0.75mm/px · z∈[+24,+159]mm · 2 of 23 slices shown (1 of 3)]
[im 1/23]
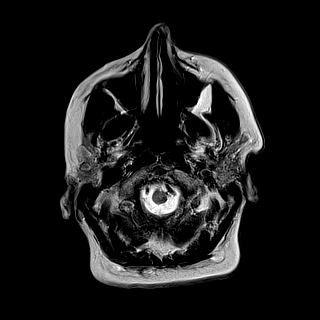
[im 23/23]
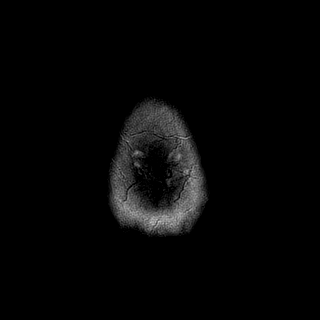

[Series 8: T2 · axial · 5.0mm · 0.45mm/px · z∈[+22,+158]mm · 2 of 23 slices shown (2 of 3)]
[im 1/23]
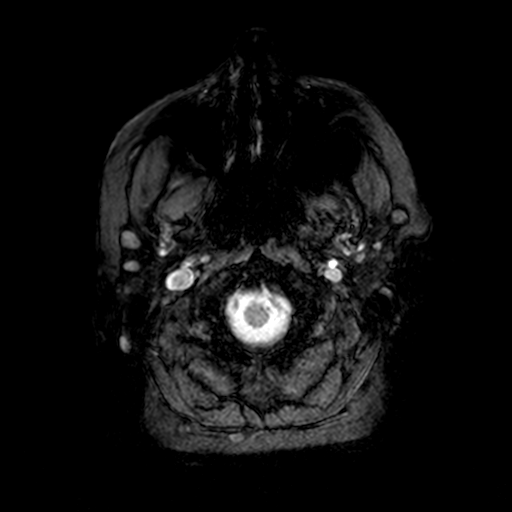
[im 23/23]
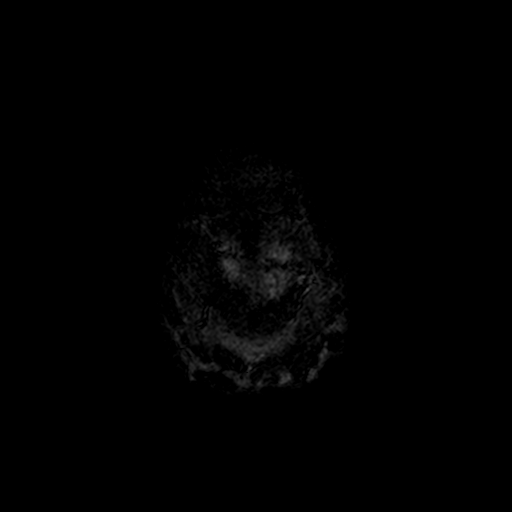

[Series 9: FLAIR · axial · 3.0mm · 0.94mm/px · z∈[+25,+158]mm · 4 of 48 slices shown]
[im 1/48]
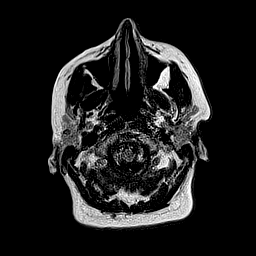
[im 16/48]
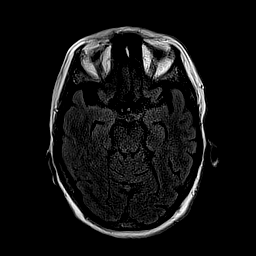
[im 32/48]
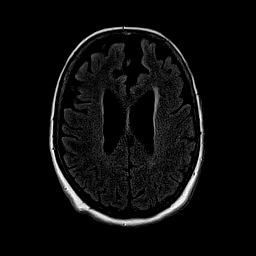
[im 48/48]
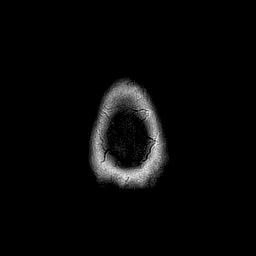

[Series 10: T1 · axial · 2.0mm · 0.47mm/px · z∈[-6,+189]mm · 8 of 104 slices shown (2 of 2)]
[im 1/104]
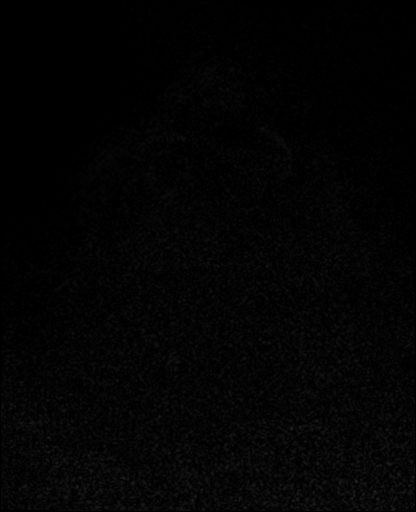
[im 15/104]
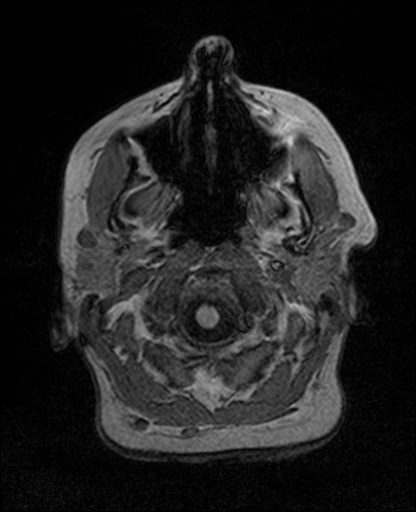
[im 30/104]
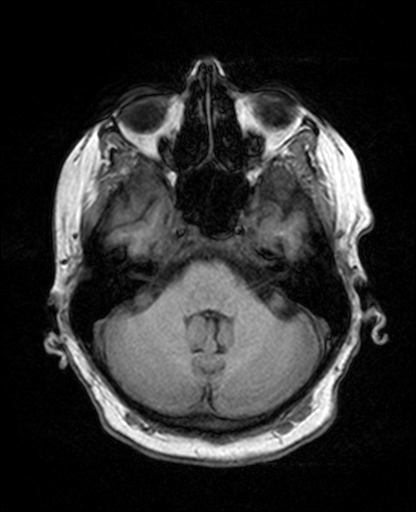
[im 45/104]
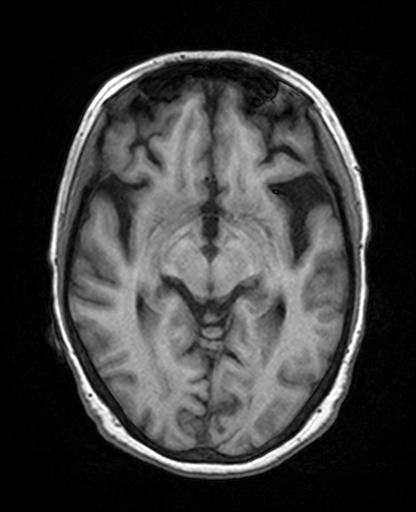
[im 59/104]
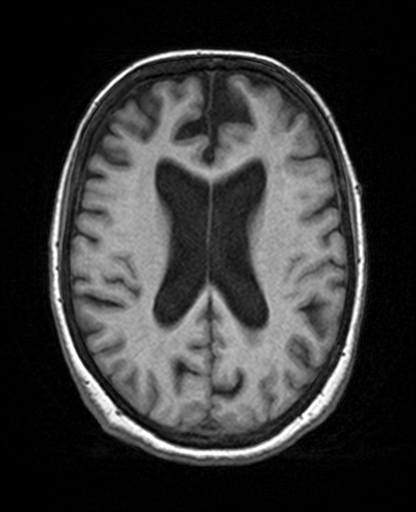
[im 74/104]
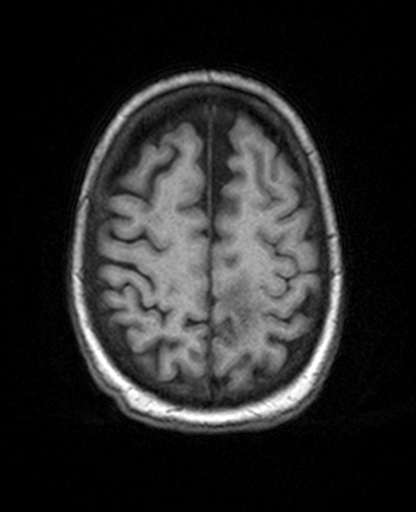
[im 89/104]
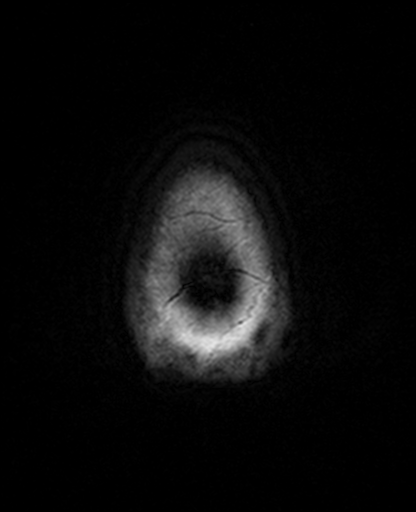
[im 104/104]
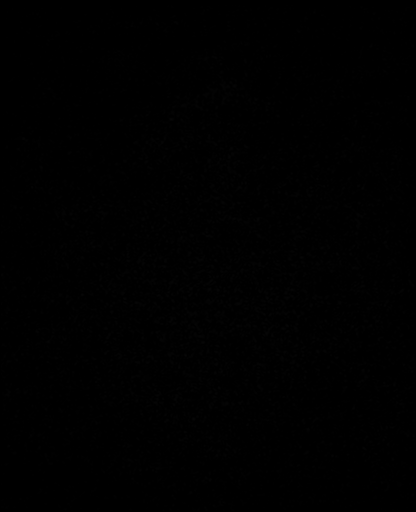

[Series 11: T2 · coronal · 5.0mm · 0.75mm/px · 2 of 28 slices shown (3 of 3)]
[im 1/28]
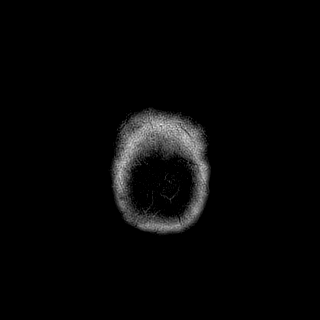
[im 28/28]
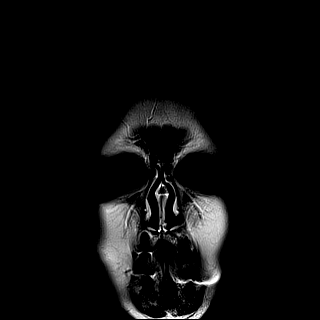

[35 of 48 positions shown; findings below may reference images not displayed]

FINDINGS: MRI HEAD FINDINGS

Brain: The brainstem and cerebellum are normal. Cerebral hemispheres
show age related volume loss with some frontal predominance. Right
cerebral hemisphere shows minimal small vessel change of the white
matter. Left cerebral hemisphere shows mild small vessel change of
the white matter. At the left parietal vertex, within the deep white
matter, there is a 2 cm in diameter cystic lesion with surrounding
low level restricted diffusion and some adjacent vasogenic edema.
This does not look like a typical ischemic infarction. No evidence
of susceptibility artifact to suggest resolving hematoma. The
differential diagnosis includes tumor and cerebritis, both
infectious inflammatory and autoimmune. This includes progressive
multifocal leukoencephalopathy. I would suggest postcontrast imaging
to evaluate this more completely.

Vascular: Major vessels at the base of the brain show flow.

Skull and upper cervical spine: Negative

Sinuses/Orbits: Clear/normal

Other: None

MRA HEAD FINDINGS

Both internal carotid arteries are widely patent into the brain. No
siphon stenosis. The anterior and middle cerebral vessels are patent
without proximal stenosis, aneurysm or vascular malformation.

Both vertebral arteries are widely patent to the basilar. No basilar
stenosis. Posterior circulation branch vessels appear normal.
IMPRESSION: Normal MR angiography.

Findings not suggestive of ischemic infarction. 2 cm cystic lesion
in the left parietal deep white matter with regional vasogenic edema
and a possible wall showing some restricted diffusion. Differential
diagnosis is tumor versus cerebritis. Postcontrast imaging would be
suggested for more complete evaluation.
